# Patient Record
Sex: Female | Born: 1955 | Race: White | Hispanic: No | Marital: Married | Smoking: Former smoker
Health system: Southern US, Community
[De-identification: ages and names within clinical notes are randomized; demographics above are authoritative.]

## PROBLEM LIST (undated history)

## (undated) DIAGNOSIS — I10 Essential (primary) hypertension: Secondary | ICD-10-CM

## (undated) DIAGNOSIS — F419 Anxiety disorder, unspecified: Secondary | ICD-10-CM

## (undated) DIAGNOSIS — E7800 Pure hypercholesterolemia, unspecified: Secondary | ICD-10-CM

## (undated) DIAGNOSIS — K219 Gastro-esophageal reflux disease without esophagitis: Secondary | ICD-10-CM

## (undated) HISTORY — PX: ABDOMINAL HYSTERECTOMY: SHX81

## (undated) HISTORY — PX: ADRENAL GLAND SURGERY: SHX544

## (undated) HISTORY — PX: CATARACT EXTRACTION: SUR2

---

## 2001-08-24 ENCOUNTER — Ambulatory Visit (HOSPITAL_COMMUNITY): Admission: RE | Admit: 2001-08-24 | Discharge: 2001-08-24 | Payer: Self-pay | Admitting: Gastroenterology

## 2001-08-24 ENCOUNTER — Encounter (INDEPENDENT_AMBULATORY_CARE_PROVIDER_SITE_OTHER): Payer: Self-pay | Admitting: *Deleted

## 2001-11-08 ENCOUNTER — Encounter: Payer: Self-pay | Admitting: Emergency Medicine

## 2001-11-08 ENCOUNTER — Ambulatory Visit (HOSPITAL_COMMUNITY): Admission: EM | Admit: 2001-11-08 | Discharge: 2001-11-08 | Payer: Self-pay | Admitting: Emergency Medicine

## 2004-09-12 ENCOUNTER — Encounter: Admission: RE | Admit: 2004-09-12 | Discharge: 2004-09-12 | Payer: Self-pay | Admitting: Chiropractic Medicine

## 2004-09-20 ENCOUNTER — Encounter: Admission: RE | Admit: 2004-09-20 | Discharge: 2004-09-20 | Payer: Self-pay | Admitting: Gastroenterology

## 2004-09-24 ENCOUNTER — Encounter: Admission: RE | Admit: 2004-09-24 | Discharge: 2004-09-24 | Payer: Self-pay | Admitting: Internal Medicine

## 2004-11-26 ENCOUNTER — Inpatient Hospital Stay (HOSPITAL_COMMUNITY): Admission: RE | Admit: 2004-11-26 | Discharge: 2004-11-29 | Payer: Self-pay | Admitting: Urology

## 2004-11-26 ENCOUNTER — Encounter (INDEPENDENT_AMBULATORY_CARE_PROVIDER_SITE_OTHER): Payer: Self-pay | Admitting: *Deleted

## 2005-02-05 ENCOUNTER — Ambulatory Visit (HOSPITAL_COMMUNITY): Admission: RE | Admit: 2005-02-05 | Discharge: 2005-02-05 | Payer: Self-pay | Admitting: Endocrinology

## 2005-08-25 ENCOUNTER — Encounter: Admission: RE | Admit: 2005-08-25 | Discharge: 2005-08-25 | Payer: Self-pay | Admitting: Neurosurgery

## 2005-09-02 ENCOUNTER — Encounter: Admission: RE | Admit: 2005-09-02 | Discharge: 2005-09-02 | Payer: Self-pay | Admitting: Otolaryngology

## 2005-09-19 ENCOUNTER — Ambulatory Visit (HOSPITAL_COMMUNITY): Admission: RE | Admit: 2005-09-19 | Discharge: 2005-09-19 | Payer: Self-pay | Admitting: Internal Medicine

## 2008-07-13 ENCOUNTER — Encounter (INDEPENDENT_AMBULATORY_CARE_PROVIDER_SITE_OTHER): Payer: Self-pay | Admitting: Obstetrics and Gynecology

## 2008-07-13 ENCOUNTER — Ambulatory Visit (HOSPITAL_COMMUNITY): Admission: RE | Admit: 2008-07-13 | Discharge: 2008-07-14 | Payer: Self-pay | Admitting: Obstetrics and Gynecology

## 2010-03-18 ENCOUNTER — Other Ambulatory Visit: Payer: Self-pay | Admitting: Gynecologic Oncology

## 2010-04-11 ENCOUNTER — Other Ambulatory Visit: Payer: Self-pay | Admitting: Obstetrics and Gynecology

## 2010-04-11 ENCOUNTER — Ambulatory Visit: Payer: BC Managed Care – PPO | Attending: Gynecologic Oncology | Admitting: Gynecologic Oncology

## 2010-04-11 DIAGNOSIS — R87612 Low grade squamous intraepithelial lesion on cytologic smear of cervix (LGSIL): Secondary | ICD-10-CM | POA: Insufficient documentation

## 2010-04-11 DIAGNOSIS — E785 Hyperlipidemia, unspecified: Secondary | ICD-10-CM | POA: Insufficient documentation

## 2010-04-11 DIAGNOSIS — I10 Essential (primary) hypertension: Secondary | ICD-10-CM | POA: Insufficient documentation

## 2010-04-11 DIAGNOSIS — F411 Generalized anxiety disorder: Secondary | ICD-10-CM | POA: Insufficient documentation

## 2010-04-11 DIAGNOSIS — Z79899 Other long term (current) drug therapy: Secondary | ICD-10-CM | POA: Insufficient documentation

## 2010-04-15 NOTE — Consult Note (Signed)
NAME:  Tammy Weeks, GOVERNALE NO.:  [id number]  MEDICAL RECORD NO.:  [id number]           PATIENT TYPE:  LOCATION:                                 FACILITY:  PHYSICIAN:  Laurette Schimke, MD          DATE OF BIRTH:  DATE OF CONSULTATION:  04/11/2010 DATE OF DISCHARGE:                                CONSULTATION   REASON FOR VISIT:  Consultation was requested for low-grade squamous intraepithelial lesion Papanicolaou.  HISTORY OF PRESENT ILLNESS:  This is a lovely 55 year old gravida 1, para 0 with a history of abnormal Pap tests for over 12 years.  She reports a history of two prior LEEP procedures.  The degree of dysplasia is unclear.  Because of persistently abnormal Pap tests, she underwent a laparoscopic-assisted vaginal hysterectomy and bilateral salpingo- oophorectomy in July 2010.  Final pathology was consistent with HPV changes.  No high-grade dysplasia was identified.  Her history is notable for a recent Pap that demonstrated ascus negative HPV.  RepeatPap 6 months later was notable for a low-grade squamous intraepithelial lesion.  She denies any vulva pruritus.  She denies any history of vulva intraepithelial neoplasia.  PAST MEDICAL HISTORY:  Hypertension for 8 years, hyperlipidemia for 8 years, longstanding anxiety.  PAST SURGICAL HISTORY:  Cervical LEEPs, laparoscopic-assisted vaginal hysterectomy and bilateral salpingo-oophorectomy in July 20, 2008.  SOCIAL HISTORY:  She reports a history of one pack per day tobacco use for 20 years that stopped 15 years ago.  She is married in a close, warm, loving relationship.  FAMILY HISTORY:  Notable for a brother diagnosed with prostate cancer at the age of 20 and a mother with uterine cancer diagnosed at the age of 68.  MEDICATIONS: 1. Vagifem three times per week. 2. Zoloft 50 mg daily. 3. Diovan 80/12.5 mg daily. 4. Multivitamins. 5. Calcium.  ALLERGIES:  SULFA AND PERCOCET THAT CAUSE HIVES.  REVIEW  OF SYSTEMS:  No vaginal bleeding.  Denies vaginal dryness.  No vulva pruritus or vulva masses.  PHYSICAL EXAMINATION:  GENERAL:  A well-developed pleasant female in no acute distress. VITAL SIGNS:  Weight 176 pounds, height 5 feet 3 inches, blood pressure 120/64, pulse of 76. LYMPH NODE SURVEY:  No inguinal adenopathy. PELVIC EXAMINATION:  Normal external genitalia, Bartholin's, urethra and Skene's.  Colposcopy of the vulva is without any acetowhite changes in the area of the vulva, perineum or perianal areas.  On pelvic examination the vagina is atrophic.  Colposcopy of the vagina was notable for the absence of any acetowhite changes.  There were two areas, unclear if it is just secondary to atrophy or possible residual glandular areas.  These are at 3 o'clock and 9 o'clock approximately 1 cm from the vaginal apex.  Both were biopsied.  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  History of Papanicolaou demonstrating ascus human papillomavirus negative.  I do not have the human papillomavirus DNA typing present and it is unclear whether it is negative human papillomavirus for high-risk oncogenic types or whether it is negative for the nononcogenic and oncogenic types.  I assured Mrs.  Hamblin and her husband that human papillomavirus negativity provides a great deal of comfort that there is little chance of dysplastic process that would progress to dysplasia.  It is quite possible that she is positive for a low-risk human papillomavirus type which would be consistent with her abnormal low-grade Papanicolaou, which does demonstrate a low-risk human papillomavirus infection.  I have requested review of the Papanicolaou test by Dr. Hollice Espy as a second opinion and I will contact the patient with the results of the biopsies and the Papanicolaou reviews.  If no significant dysplasia is identified, then I would recommend at  most annual Papanicolaou test, especially in the face of a negative human papillomavirus.  We will also try to obtain the human papillomavirus DNA testing to confirm which human papillomavirus she is negative for.  They had very many questions and I believe that they were all answered to their satisfaction.     Laurette Schimke, MD     WB/MEDQ  D:  04/11/2010  T:  04/12/2010  Job:  [id number]  cc:   Marcelino Duster L. Vincente Poli, M.D. Fax: [phone]  Telford Nab, R.N. [address] [place], [place] [address]  Electronically Signed by Laurette Schimke MD on 04/15/2010 11:29:32 AM

## 2010-04-21 LAB — CBC
HCT: 31.3 % — ABNORMAL LOW (ref 36.0–46.0)
MCHC: 35.8 g/dL (ref 30.0–36.0)
MCV: 95.8 fL (ref 78.0–100.0)
Platelets: 182 10*3/uL (ref 150–400)
RBC: 3.26 MIL/uL — ABNORMAL LOW (ref 3.87–5.11)

## 2010-04-22 LAB — BASIC METABOLIC PANEL
BUN: 16 mg/dL (ref 6–23)
CO2: 28 mEq/L (ref 19–32)
Calcium: 9.3 mg/dL (ref 8.4–10.5)
Chloride: 102 mEq/L (ref 96–112)
Creatinine, Ser: 0.72 mg/dL (ref 0.4–1.2)
GFR calc Af Amer: >60 mL/min (ref >60)

## 2010-04-22 LAB — CBC
MCHC: 35.3 g/dL (ref 30.0–36.0)
MCV: 93.9 fL (ref 78.0–100.0)
Platelets: 237 10*3/uL (ref 150–400)
RBC: 4.43 MIL/uL (ref 3.87–5.11)

## 2010-05-03 NOTE — Consult Note (Signed)
  NAME:  Tammy Weeks, SHADOWENS NO.:  [id number]  MEDICAL RECORD NO.:  [id number]           PATIENT TYPE:  LOCATION:                                 FACILITY:  PHYSICIAN:  Laurette Schimke, MD          DATE OF BIRTH:  DATE OF CONSULTATION:04/25/2010 DATE OF DISCHARGE:                                CONSULTATION   REASON FOR CONSULTATION:  Regarding review of Pap test.  Outside Pap test was reviewed by Dr. Pecola Leisure on April 23, 2010. This Pap is regarded as demonstrating a low-grade squamous intraepithelial lesion with presence of koilocyte.  Note is made of the fact that vaginal biopsies are both negative and that the HPV DNA test was a high-risk type.  The Pap changes may likely reflect infection of vaginal cells by low-risk HPV DNA, hence the koilocytic changes.  The recommendation would be for just general surveillance in 1 year given the negative HPV DNA.     Laurette Schimke, MD     WB/MEDQ  D:  04/25/2010  T:  04/26/2010  Job:  [id number]  CC Marcelle Overlie, MD  Telford Nab, RN MSN  Electronically Signed by Laurette Schimke MD on 05/03/2010 09:43:27 AM

## 2010-05-24 ENCOUNTER — Other Ambulatory Visit: Payer: Self-pay | Admitting: Dermatology

## 2010-05-28 NOTE — H&P (Signed)
NAME:  Tammy Weeks, Tammy Weeks NO.:  [id number]   MEDICAL RECORD NO.:  [id number]          PATIENT TYPE:  AMB   LOCATION:                                [facility]  [facility]   PHYSICIAN:  Michelle L. Grewal, M.D.DATE OF BIRTH:  December 09, 1955   DATE OF ADMISSION:  07/13/2008  DATE OF DISCHARGE:                              HISTORY & PHYSICAL   HISTORY OF PRESENT ILLNESS:  This patient is a 55 year old gravida 0  postmenopausal presents for LAVH and BSO.  She has a history of cervical  dysplasia.  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  She had a Pap smear in November 2009 that  was normal.  She subsequently had a followup Pap smear in April of this  year that showed low-grade SIL.  She was counseled in regards to the  possibility of recurrent dysplasia.  She does not want to have a  colposcopy.  She wants to have an LAVH and at the same time requests  removal of her ovaries.  She is well aware and has been counseled about  the potential for vaginal dysplasia post hysterectomy and the  ramifications of that.   MEDICATIONS:  Zoloft, Diovan, Lipitor, multivitamin, and calcium.   ALLERGIES:  She is allergic to OXYCODONE and QUINOLONES.   SOCIAL HISTORY:  She is married.  She is not a smoker.  She drinks one  alcoholic drink per day.  She denies any social drug use.  Her primary  care doctor is Dr. Donette Larry.   FAMILY HISTORY:  Significant for osteoporosis, hypertension, endometrial  cancer.   PAST MEDICAL HISTORY:  Significant for cervical dysplasia, high  cholesterol, and hypertension.   REVIEW OF SYSTEMS:  Unremarkable.   PHYSICAL EXAMINATION:  VITAL SIGNS:  Height 5 foot 2 inches, weight 171,  BP 128/78.  GENERAL:  She is alert and oriented, in no apparent distress.  LUNGS:  Clear to auscultation bilaterally.  CARDIAC:  Regular rate and rhythm.  BREASTS:  Soft, nontender, no masses.  PELVIC:  External genitalia within normal  limits.  Vagina appears  normal.  Cervix grossly appears normal.  She has fair descensus.  Cervix  is normal size.  The uterus is normal size.  Adnexa nontender.  No  masses.  EXTREMITIES:  No edema and negative Hoffman.   IMPRESSION:  Persistent cervical dysplasia.   PLAN:  We will proceed with LAVH and BSO.  The risk of the surgery have  been discussed with the patient which include the risk of anesthesia,  risk of injury to internal organs, risk of bleeding and infection, risk  of DVT.  Postop care discussed with the patient.      Michelle L. Vincente Poli, M.D.  Electronically Signed     MLG/MEDQ  D:  07/10/2008  T:  07/11/2008  Job:  [id number]

## 2010-05-28 NOTE — Op Note (Signed)
NAME:  Tammy Weeks, VOHS NO.:  [id number]   MEDICAL RECORD NO.:  [id number]          PATIENT TYPE:  OIB   LOCATION:  9309                          FACILITY:  [facility]   PHYSICIAN:  Michelle L. Grewal, M.D.DATE OF BIRTH:  03-12-1955   DATE OF PROCEDURE:  07/13/2008  DATE OF DISCHARGE:  07/14/2008                               OPERATIVE REPORT   PREOPERATIVE DIAGNOSIS:  Cervical dysplasia.   POSTOPERATIVE DIAGNOSES:  1. Cervical dysplasia and adhesions.  2. Adhesions involving the left ovary, tubes, and colon.  3. Right ovarian cyst.   SURGERIES:  Laparoscopic-assisted vaginal hysterectomy and bilateral  salpingo-oophorectomy and lysis of adhesions.   SURGEON:  Michelle L. Vincente Poli, MD   ASSISTANT:  Dineen Kid. Rana Snare, MD   ESTIMATED BLOOD LOSS:  150 mL.   ANESTHESIA:  General.   PATHOLOGY:  Uterus, cervix, fallopian tubes and ovaries.   DRAINS:  Foley catheter.   PROCEDURE IN DETAIL:  This patient have been formally consented in the  office.  She was taken to the operating room and intubated by  Anesthesia.  She was then prepped and draped in the lithotomy position  and a Foley catheter was inserted and draining clear urine.  A time-out  was then performed.  A uterine manipulator was inserted into the uterus.  The attention was turned to the umbilicus.  A small skin incision was  made.  A Veress needle was inserted.  Pneumoperitoneum was performed.  The Veress needle was removed and the trocar was inserted and the  patient was placed in Trendelenburg position.  The laparoscope was  introduced through the trocar sheath.  The secondary 5-mm trocar was  placed suprapubically under direct visualization.  I then inspected the  pelvis.  The uterus was very small and mobile.  No endometriosis was  noted.  Her right ovary was enlarged and it appeared to be very smooth-  walled cyst approximately 3 cm.  My impression is that possibly could  have been a mucinous cystadenoma  or just a simple paraovarian cyst.  I  then inspected the left ovary.  The left ovary was high in the pelvis  and densely adherent to the colon.  It was slightly enlarged.  We  decided to approach the right ovary and tube first.  I placed an  atraumatic grasper on the ovary, elevated it, identified the ureter  which was well down in the pelvis and well away from the ovary, placed  the EnSeal instrument just below the ovary, cauterized the  infundibulopelvic ligament and carried that down to the round ligament,  that was performed with excellent hemostasis.  The ureter was  peristalsing normally.  We then turned our attention to the left side.  Because I knew this will be difficult dissection because of extensive  adhesions involving the colon, I requested Dr. Rana Snare to come in and he  scrubbed in for this portion and the remainder of the procedure, and  elevated the left ovary as much as I could and then I decided for better  visualization to put in a third port, which I did in  the left lower  quadrant with a 5-mm trocar under direct visualization.  Using  hydrodissection, able to identify a plane between the ovary and the  colon.  Using small snips with the Metzenbaum scissors, I was able to  tiptoe between the colon and the ovary.  Once we were able to mobilize  the colon more medially with excellent hemostasis, then I was able to  visualize the IP ligament.  Again, the ureter was well below where we  were, so I placed an EnSeal instrument across the IP ligament,  cauterized that and carried that down to the round ligament.  Once we  mobilized the colon, we were able to perform this side of the procedure  without much difficulty.  However, the lysis of adhesions took at least  30 minutes to do, because we did them so slowly.  There was no contact  with the bowel serosa with the scissors whatsoever and so my concern for  a bowel injury was very low.  At this point, we then converted  vaginally  by releasing the pneumoperitoneum and then placing a weighted speculum  in the vagina, grasping the cervix with a tenaculum, her cervix by the  way was very short because she had a cone biopsy in 2009.  A  circumferential incision was made around the cervix.  The posterior cul-  de-sac was entered sharply using Mayo scissors.  A weighted speculum was  placed in the cul-de-sac.  We then developed a bladder flap anteriorly  and we entered the anterior cul-de-sac with Metzenbaum scissors.  Using  curved Heaney clamp, the uterosacral cardinal ligaments on either side  were clamped, staying just beside the cervix.  Each pedicle was cut and  suture ligated using 0 Vicryl suture.  Once we have reached our way up  the broad ligament with a series of another set of clamps, each pedicle  was suture ligated using 0 Vicryl suture.  At this point, we were able  to retroflex the uterus.  The remainder of the broad ligament was  clamped on either side.  The specimen was removed and was identified the  cervix, uterus, fallopian tubes and bilateral ovaries.  The remainder of  the pedicle was suture ligated using 0 Vicryl suture.  The posterior  cuff was closed from 3-9 o'clock in a running locked stitch using 0  Vicryl.  The cuff was closed completely anterior to posterior using 0  Vicryl suture.  Hemostasis was excellent.  At this point, I changed my  gloves and went back up to the abdomen where I replaced the  pneumoperitoneum.  I then irrigated the pelvis.  The cul-de-sac and the  vaginal cuff was completely hemostatic.  There was a very large raw area  over on the patient's left side from the lysis of adhesions, which I did  place an Interceed.  There was 1 small bleeder on the peritoneum, which  I buzzed with a Kleppinger.  Otherwise, everything was hemostatic.  I  placed Interceed between the sidewall and the colon, so hopefully this  will minimize adhesion formation in the future.  The  pneumoperitoneum  was released.  The trocars were removed.  Local was infiltrated at each  site, each skin site was closed with interrupted using 3-0 Vicryl.  All  sponge, lap and instrument counts were correct x2.  The patient was  extubated.  She went to recovery room in stable condition.      Michelle L. Vincente Poli, M.D.  Electronically Signed     MLG/MEDQ  D:  07/14/2008  T:  07/14/2008  Job:  [id number]

## 2010-05-29 ENCOUNTER — Other Ambulatory Visit: Payer: Self-pay | Admitting: Otolaryngology

## 2010-05-29 DIAGNOSIS — R220 Localized swelling, mass and lump, head: Secondary | ICD-10-CM

## 2010-05-31 ENCOUNTER — Ambulatory Visit
Admission: RE | Admit: 2010-05-31 | Discharge: 2010-05-31 | Disposition: A | Discharge status: Home / Self Care | Payer: BC Managed Care – PPO | Source: Ambulatory Visit | Attending: Otolaryngology | Admitting: Otolaryngology

## 2010-05-31 DIAGNOSIS — R220 Localized swelling, mass and lump, head: Secondary | ICD-10-CM

## 2010-05-31 NOTE — Discharge Summary (Signed)
NAME:  Tammy Weeks, Tammy Weeks NO.:  [id number]   MEDICAL RECORD NO.:  [id number]          PATIENT TYPE:  INP   LOCATION:  1611                         FACILITY:  [facility]   PHYSICIAN:  Heloise Purpura, MD      DATE OF BIRTH:  [birth year]   DATE OF ADMISSION:  11/26/2004  DATE OF DISCHARGE:  11/29/2004                                 DISCHARGE SUMMARY   ADMISSION DIAGNOSIS:  Right adrenal mass.   DISCHARGE DIAGNOSIS:  Right adrenocortical adenoma.   HISTORY AND PHYSICAL:  For full details, please see admission history and  physical.  Briefly, [name] is a 55 year old female who was found to have an  incidentally detected 4.8 cm right adrenal mass on a CT scan.  This was  further evaluated with an MRI and did appear to have imaging characteristics  consistent with an adrenal adenoma.  After a biochemical evaluation, this  mass did appear to be nonfunctional; however, due to the size of the mass,  it was decided to proceed with an adrenalectomy.   HOSPITAL COURSE:  The patient was admitted to the hospital on November 26, 2004 and underwent a right laparoscopic adrenalectomy.  The patient  tolerated this procedure well without complications.  Postoperatively, she  was able to be transferred to a regular hospital room following recovery  from anesthesia.  Over the course of the next couple of days, she was able  to resume ambulation.  Her diet was gradually advanced, as bowel function  returned.  By postoperative day #3, the patient wass tolerating a clear-  liquid diet and having bowel movements.  She was ambulating without  difficulty, and her pain was controlled with oral pain medication.  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  This mass measured 5.5 cm.  The pathology report  was discussed with the patient prior to her discharge.  The patient's blood  pressure remained stable throughout her hospital course;  however, she was  not maintained on her Diovan throughout her hospitalization.   DISPOSITION:  Home.   DISCHARGE INSTRUCTIONS:  The patient was instructed to gradually advance her  diet at home.  She was instructed to be ambulatory but to specifically  refrain from any heavy lifting or strenuous activity.  She was told that she  may resume all of her regular home medications except for Diovan.  She will  monitor her blood pressure at  home and will notify Dr. Donette Larry over the course of the next couple of weeks  if her blood pressure remains low, to re-evaluate whether she should resume  this medication.   FOLLOW UP:  Patient will plan to follow up in approximately 10 days for  postoperative check.           ______________________________  Heloise Purpura, MD  Electronically Signed     LB/MEDQ  D:  11/29/2004  T:  11/29/2004  Job:  [id number]   cc:   Jeannett Senior A. Evlyn Kanner, M.D.  Fax: [phone]   Georgann Housekeeper, MD  Fax: [phone]

## 2010-05-31 NOTE — Op Note (Signed)
NAME:  Tammy Weeks, Tammy Weeks NO.:  [id number]   MEDICAL RECORD NO.:  [id number]                   PATIENT TYPE:  AMB   LOCATION:  ENDO                                 FACILITY:  [facility]   PHYSICIAN:  Charolett Bumpers, M.D.             DATE OF BIRTH:  1955/04/25   DATE OF PROCEDURE:  08/24/2001  DATE OF DISCHARGE:  08/24/2001                                 OPERATIVE REPORT   PROCEDURE:  Colonoscopy and polypectomy.   REFERRED BY:  Zola Button T. Lazarus Salines, M.D., and Georgann Housekeeper, M.D.   INDICATIONS:  The patient is a 55 year old female born Jan 16, 2055.  The patient  has intermittent painless hematochezia.  Approximately three years ago she  underwent a diagnostic colonoscopy with polypectomy in [place], [place].  I do not have a polyp pathology report.   The patient is undergoing a follow-up diagnostic colonoscopy to evaluate the  intermittent painless hematochezia.   ENDOSCOPIST:  Charolett Bumpers, M.D.   PREMEDICATION:  Versed 10 mg, fentanyl 100 mg.   ENDOSCOPE:  Olympus pediatric colonoscope.   DESCRIPTION OF PROCEDURE:  After obtaining informed consent, the patient was  placed in the left lateral decubitus position.  I administered intravenous  Versed and intravenous fentanyl to achieve conscious sedation for the  procedure.  The patient's blood pressure, oxygen saturation, and cardiac  rhythm were monitored during the procedure and documented in the medical  record.   Anal inspection was normal.  Digital rectal exam was normal.  The Olympus  pediatric video colonoscope was introduced into the rectum and easily  advanced into the cecum.  A normal-appearing ileocecal valve was intubated  and the distal ileum inspected.  Colonic preparation for the exam today was  excellent.   Rectum normal.   Sigmoid colon and descending colon:  From the distal sigmoid colon at  approximately 30 cm from the anal verge, two 1 mm sessile polyps were  removed  with the hot biopsy forceps and submitted for pathologic  interpretation.   Splenic flexure normal.   Transverse colon normal.   Hepatic flexure normal.   Ascending colon normal.   Cecum and ileocecal valve.   Distal ileum normal.   ASSESSMENT:  From the distal sigmoid colon, two 1 mm sessile polyps were  removed with hot biopsy forceps; otherwise normal proctocolonoscopy to the  cecum.   RECOMMENDATIONS:  If the polyps removed today or the polyps removed during  her colonoscopy in [place] three years ago are neoplastic (adenomatous), the  patient should undergo a repeat colonoscopy in five years.                                                    Charolett Bumpers, M.D.    MKJ/MEDQ  D:  08/24/2001  T:  08/26/2001  Job:  [id number]

## 2010-05-31 NOTE — H&P (Signed)
NAME:  Tammy Weeks, Tammy Weeks NO.:  [id number]   MEDICAL RECORD NO.:  [id number]          PATIENT TYPE:  INP   LOCATION:  0007                         [facility]ACILITY:  [facility]   PHYSICIAN:  Heloise Purpura, MD      DATE OF BIRTH:  1955/11/02   DATE OF ADMISSION:  11/26/2004  DATE OF DISCHARGE:                                HISTORY & PHYSICAL   CHIEF COMPLAINT:  Right adrenal mass.   HISTORY:  Tammy Weeks is a 55 year old female who recently underwent a CT scan  for gastrointestinal complaints. Incidentally, she was noted to have a 4.8  cm right adrenal mass on her CT scan. This was further characterized with an  MRI and imaging characteristics were consistent with an adrenal adenoma.  Clinically, although she has had some hypertension and what she describes as  panic attacks, she did not have clinical symptoms consistent with a  functional adrenal mass. Furthermore, she underwent a full biochemical  evaluation which demonstrated normal urine aldosterone levels, normal  potassium, normal 24-hour urine cortisol, and 24-hour urine catecholamines.  After a discussion regarding the size of her adrenal mass, the patient did  elect to proceed with a laparoscopic adrenalectomy. She was therefore  admitted to the hospital on November 26, 2004, for this procedure.   PAST MEDICAL HISTORY:  Hypertension.   PAST SURGICAL HISTORY:  1.  Removal of a breast lesion that was benign in 1995.  2.  Colonoscopy.   CURRENT MEDICATIONS:  1.  Diovan.  2.  Crestor.  3.  Zoloft.   ALLERGIES:  PERCOCET which causes a rash.   FAMILY HISTORY:  The patient's father died at age 54 of a heart attack. The  patient's mother died at age 39 of a malignancy. The patient has two  siblings who are living and are healthy. She denies a history of adrenal  problems in the family or GU malignancy in the family. There is a history of  heart disease and high blood pressure in the family.   SOCIAL HISTORY:  The  patient did smoke a half a pack of cigarettes for 20  years but quit 8 years ago. She drinks alcohol occasionally.   REVIEW OF SYSTEMS:  A complete review of systems was performed. Pertinent  positives and negatives are included in the history of present illness. In  addition, the patient denies specifically any weight loss, night sweats, or  fever. All other systems are reviewed and otherwise negative.   PHYSICAL EXAMINATION:  HEENT:  Normocephalic, atraumatic.  NECK:  No thyromegaly.  LYMPH NODE SURVEY:  There was no cervical, supraclavicular, or inguinal  lymphadenopathy.  HEART:  Regular rate and rhythm without obvious  murmurs.  LUNGS:  Clear bilaterally.  ABDOMEN:  Soft, nontender, nondistended, without abdominal masses.  BACK:  No CVA tenderness.  GENITOURINARY:  Deferred.  NEUROLOGIC:  Grossly intact.  EXTREMITIES:  No edema.   IMPRESSION:  A 4.8 cm right adrenal mass.   PLAN:  The patient will undergo a right laparoscopic adrenalectomy and then  be admitted to the hospital for routine postoperative care.  ______________________________  Heloise Purpura, MD  Electronically Signed     LB/MEDQ  D:  11/26/2004  T:  11/26/2004  Job:  [id number]

## 2010-05-31 NOTE — Cardiovascular Report (Signed)
NAME:  Tammy Weeks, Tammy Weeks NO.:  [id number]   MEDICAL RECORD NO.:  [id number]                   PATIENT TYPE:  INP   LOCATION:  1827                                 FACILITY:  [facility]   PHYSICIAN:  Meade Maw, M.D.                 DATE OF BIRTH:  05-Jun-1955   DATE OF PROCEDURE:  DATE OF DISCHARGE:                              CARDIAC CATHETERIZATION   INDICATIONS FOR PROCEDURE:  Recurrent chest pain with nonspecific ST  changes.   DESCRIPTION OF PROCEDURE:  After obtaining written informed consent, the  patient was brought to the cardiac catheterization lab in the postabsorptive  state.  Preoperative sedation was achieved using Versed 3 mg and Fentanyl 50  mcg.  The right groin was prepped and draped in the usual sterile fashion.  Local anesthesia was achieved using 1% Xylocaine.  A #6 French hemostasis  sheath was placed into the right femoral artery using the modified Seldinger  technique.  Selective coronary angiography was performed using a JL4/JR4  Judkins catheter.  Single plane ventriculogram was performed in the RAO  position using a #6 French pigtail curved catheter.  All catheter exchanges  were made over a guidewire.  The hemostasis sheath was flushed following  each engagement.  Following the procedure, there was no identifiable  disease.  The patient was transferred to the holding area.  The hemostasis  sheath was removed.  Hemostasis was achieved using digital pressures.   FINDINGS:  1. Fluoroscopy revealed no calcification of the coronaries.  2. The left ventricular pressure was 133 with an EDP of 18.  Aortic pressure     was 151 with an aortic diastolic pressure of 92.  There was no gradient     noted on pullback.  3. Single plane ventriculogram revealed normal wall motion.  Ejection     fraction was 70%.  4. Coronary angiography     A. The left main coronary artery bifurcates into the left anterior        descending and circumflex  vessel.  There is no disease in the left        main coronary artery.     B. Left anterior descending:  The left anterior descending gives rise to        a moderate to large D-1, moderate D-2, goes on to end as an apical        branch.  There is no disease in the left anterior descending or its        branches.  The distal left anterior descending is rather small in        caliber.     C. Circumflex vessel:  The circumflex vessel is a moderate to large        vessel.  It gives rise to a large trifurcating OM-1, then goes on to        end as a small AV  groove vessel.  There is no disease in the        circumflex or its branches.     D. Right coronary artery:  The right coronary artery is a large dominant        artery.  It gives rise to an early RV marginal, a small RV marginal        #2, a PDA, and a large PL branch.  There is no disease in the right        coronary artery or its branches.    FINAL IMPRESSION:  1. Normal coronary angiography.  2. Normal single plane ventriculogram.   RECOMMENDATIONS:  Other etiologies for chest pain should be considered.                                                Meade Maw, M.D.    HP/MEDQ  D:  11/08/2001  T:  11/08/2001  Job:  [id number]   cc:   Georgann Housekeeper, M.D.  [address]  [place]  [place] [address]  Fax: [phone]

## 2010-05-31 NOTE — Op Note (Signed)
NAME:  Tammy Weeks, Tammy Weeks NO.:  [id number]   MEDICAL RECORD NO.:  [id number]          PATIENT TYPE:  INP   LOCATION:  0007                         [facility]AC[facility]  [facility]   PHYSICIAN:  Heloise Purpura, MD      DATE OF BIRTH:  1955/10/17   DATE OF PROCEDURE:  11/26/2004  DATE OF DISCHARGE:                                 OPERATIVE REPORT   PREOPERATIVE DIAGNOSIS:  Right adrenal mass.   POSTOPERATIVE DIAGNOSIS:  Right adrenal mass.   PROCEDURE:  Right laparoscopic adrenalectomy.   SURGEON:  Dr. Gaynelle Arabian.   ASSISTANT:  Dr. Heloise Purpura.   ANESTHESIA:  General.   COMPLICATIONS:  None.   ESTIMATED BLOOD LOSS:  100 cc.   INTRAVENOUS FLUIDS:  Lactated Ringer's 5500 cc.   SPECIMENS:  Right adrenal gland.   INDICATIONS:  Tammy Weeks is a 55 year old female who recently underwent a CT  scan for gastrointestinal complaints.  Incidentally, she was noted to have a  4.8 cm right adrenal mass, consistent with an adrenal adenoma by imaging  criteria.  The patient underwent a biochemical evaluation, and her adrenal  mass did appear to be a nonfunctional mass; however, due to the size of this  mass and after further discussion, the patient did elect to proceed with a  laparoscopic adrenalectomy.  Potential risks and benefits of this procedure  were discussed with the patient, and she consented.   DESCRIPTION OF PROCEDURE:  The patient was taken to the operating room, and  a general anesthetic was administered.  The patient was given preoperative  antibiotics and placed in the right modified flank position with care to pad  any potential pressure points.  The patient was then prepped and draped in  the usual sterile fashion, and a preoperative time out was performed.  Next,  a 1 cm vertical incision was made in the midline just superior to the  umbilicus and carried down through the subcutaneous tissue with  electrocautery.  The rectus fascia was then carefully opened in the  midline,  allowing entry into the peritoneal cavity under direct vision.  Two 0 Vicryl  stitches were then placed in the rectus fascia, and a 10 mm Hasson port was  placed into the peritoneal cavity and secured with the fascial stitches.  A  30 degree lens was used to inspect the abdomen.  There was no evidence of  any intra-abdominal injury or other abnormalities.  Attention was then  turned to placement of the remaining ports.  A 10 mm port was placed midway  between the umbilicus and xiphoid process just to the right of the falciform  ligament.  An additional 10 mm port was placed in the right lower quadrant  just lateral to the rectus muscle.  Two additional 5 mm ports were placed in  the right lateral abdominal wall for suction/irrigation, and liver  retraction.  All ports were placed under direct vision and without  difficulty.  The abdomen was insufflated up to 15 mmHg pressure.  The self-  retaining liver retractor was then placed to retract the liver cephalad,  thereby exposing the  adrenal gland and kidney just below the parietal  peritoneum.  The peritoneum was then incised with the harmonic scalpel,  allowing entry into the retroperitoneal space and identification of the  adrenal mass.  Similarly, the coronary ligament was divided along the length  of the liver along with a portion of the triangular ligament, allowing the  liver to be retracted more cephalad and improving exposure to the adrenal  gland.  With the aid of the harmonic scalpel, the tissue between the  superior pole of the kidney and the adrenal gland was carefully ligated with  ultrasonic energy.  This allowed the kidney to be retracted inferiorly, and  the remaining inferior attachments of the adrenal gland to be divided.  Similarly, many of the superior attachments to the adrenal gland were also  divided with the harmonic scalpel.  Any small arterial vessels were clipped.  The duodenum was already immobilized  medially, and the inferior vena cava  was readily identifiable.  Just medial to the adrenal gland, the adrenal  vein was seen draining into the inferior vena cava.  It was carefully  isolated with right angle dissection and then divided with multiple Hem-o-  Lok clips.  The remaining medial attachments to the adrenal gland were  ligated with either the harmonic scalpel or Hem-o-Lok clips.  The remaining  superior and lateral attachments to the adrenal gland were similarly divided  with the harmonic scalpel.  The adrenal gland was then removed and placed up  into the right upper quadrant just above the liver.  The adrenal fossa was  then carefully examined.  Interestingly, despite her negative preoperative  biochemical evaluation, there was noted to be some hypertension noted with  manipulation of the adrenal gland prior to division of the renal vein.  This  was easily managed; however, with nitroprusside and completely normalized  after the adrenal gland was disarticulated.  The adrenal fossa was then  copiously irrigated and examined for hemostasis.  Hemostasis appeared  excellent.  A half piece of Surgicel was placed in the raw surface of the  adrenal fossa, and the kidney and liver were allowed to fall back into their  normal anatomic positions.  The two 12 mm port sites were then closed with a  0 Vicryl stitch through the abdominal wall fascia with the aid of the suture-  passer device.  All other remaining ports were removed under direct vision.  The Endopouch retrieval bag was used to retrieve the adrenal specimen, and  it was then brought out through the camera port incision in the midline.  This incision was slightly extended superiorly, allowing the specimen to be  removed intact within the retrieval bag.  This fascial opening was then  closed with interrupted 0 Vicryl sutures.  Sensorcaine 0.5% was then  injected into all incision sites, which were then reapproximated at the  skin with 4-0 Vicryl subcuticular closures.  Steri-Strips and sterile dressings  were applied.  The patient appeared to tolerate the procedure well and  without complications.  Of note, all needle and sponge counts were correct  x2 at the end of the procedure.  The patient was able to be extubated and  transferred to the recovery unit in satisfactory condition.           ______________________________  Heloise Purpura, MD  Electronically Signed     LB/MEDQ  D:  11/26/2004  T:  11/26/2004  Job:  [id number]

## 2010-05-31 NOTE — H&P (Signed)
NAME:  Tammy Weeks, Tammy Weeks NO.:  [id number]   MEDICAL RECORD NO.:  [id number]                   PATIENT TYPE:  EMS   LOCATION:  MAJO                                 FACILITY:  [facility]   PHYSICIAN:  Meade Maw, M.D.                 DATE OF BIRTH:  11/16/1955   DATE OF ADMISSION:  11/08/2001  DATE OF DISCHARGE:                                HISTORY & PHYSICAL   PRIMARY CARE PHYSICIAN:  Dr. __________   IMPRESSION:  (As dictated by Dr. Meade Maw)  1. Atypical chest pain in this 55 year old female with a positive family     history for coronary artery disease, a history of dyslipidemia, with a     Cardiolite earlier this summer which was negative for evidence of     ischemia.  Electrocardiogram today revealed lateral precordial changes,     consistent with ischemia which is changed from an electrocardiogram in     June 2003.  The patient had prior electrocardiographic changes of ST     downsloping with T-wave abnormality in lead III, which have improved     today.  2. History of panic attacks, but under good control on Zoloft.   PLAN:  (As dictated by Dr. Fraser Din)  The patient is counseled to undergo and expected plans for a coronary  angiography and possible percutaneous intervention if indicated and able.  The risks, potential complications, benefits, and alternatives to the  procedure were discussed in detail.  Dr. and Mrs. Gloris Manchester. Ranes indicate  that their questions and concerns have been addressed and agreeable to  proceed.  Will supplement serum potassium with 40 mEq of K-Dur (for a  potassium of 3.5).   HISTORY OF PRESENT ILLNESS:  The patient is a very pleasant 55 year old  female who was driving her car today, and then developed lower substernal  chest pressure which was constant.  She had no associated shortness of  breath, nausea, or diaphoresis.  She did have sweaty palms.  This was  unusual, as she experienced a panic attack.  She  denied any radiation of  discomfort, though did notice mid-back pain discomfort.  She reported to the  [facility] where normocephalic,  atraumatic electrocardiogram revealed slight ST depression in the lateral  and precordial leads.  She is currently discomfort-free.   PAST MEDICAL HISTORY:  1. Hematochezia with subsequent colonoscopy by Dr. Danise Edge earlier     this year which was normal.  Two polyps removed, which were benign.  2. Panic/anxiety disorder, well controlled with Zoloft.  3. History of elevated cholesterol, on Zocor.  Denies a history of     hypertension, asthma, diabetes mellitus, or cancer.   PAST SURGICAL HISTORY:  None.   SOCIAL HISTORY:  She has been married for 1-1/2 years.  No children.  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  Tobacco:  Quit five years  earlier.  Tried it off and on as much as one pack per day for 20 years.  ETOH:  Occasional glass of wine a couple of times a week.   ALLERGIES:  PERCOCET OR PERCODAN causing a rash.  She is okay with seafood,  shell fish, and iodinated products.    MEDICATIONS:  1. Zoloft 50 mg q.d.  2. Zocor, uncertain dose, once q.d.  3. Xanax 0.5 mg p.r.n.  Took a dose this morning, but other than this,     rarely taken.  4. Vitamins and calcium.   FAMILY HISTORY:  Mother died at age 73, of throat cancer.  No history of  coronary artery disease.  Father died at age 27 of a myocardial infarction.  She has two older brothers without coronary artery disease.   REVIEW OF SYSTEMS:  As in the HPI and the past medical history.  Otherwise  denies problems with lightheadedness, dizziness, syncope, or near syncopal  episodes.  She denies dysphagia to food or fluids.  Negative constipation,  diarrhea, gastroesophageal reflux disease, or melena.  Denies dysuria or  hematuria.  No arthritic-type complaints.  Denies DOE, orthopnea, or PND.  Has fairly good exercise regimen.   Occasional bike riding and walking,  without problems.   PHYSICAL EXAMINATION:  (As performed by Dr. Fraser Din)  VITAL SIGNS:  Blood pressure 117/89, heart rate 68 and regular, respirations  18.  O2 saturation 98%.  GENERAL:  She is a well-nourished, pleasant middle-aged female, in no acute  distress.  HEENT/NECK:  Brisk bilateral upstroke without bruits.  No significant  jugular venous distention or thyromegaly.  CHEST:  Lung sounds clear with equal bilateral excursion.  Negative CPA  tenderness.  HEART:  A regular rate and rhythm without murmur, rub, or gallop.  Normal  S1, S2.  ABDOMEN:  Soft, nondistended.  Normoactive bowel sounds.  Negative abdominal  aortic, renal, or femoral bruits.  Nontender to applied pressure.  No  masses, no organomegaly appreciated.  EXTREMITIES:  Distal pulses intact.  Negative pedal edema.  NEUROLOGIC:  Grossly nonfocal, alert, and oriented x3.  GENITOURINARY:  Deferred.  RECTAL:  Deferred.   LABORATORY DATA:  Chloride 105, CO2 of 28, BUN 12, creatinine 0.7, glucose  99.  Liver function tests within normal range.  Hemoglobin 13.5, hematocrit  40, WBC 5.8, platelets 250.  CK 65, MB pending, troponin I pending.  ELECTROCARDIOGRAM:  Revealed NSR, a complete right bundle branch block,  slight ST depression/flattening in V4 through V6.  Chest x-ray:  Pending.      Salomon Fick, N.P.                       Meade Maw, M.D.    MES/MEDQ  D:  11/08/2001  T:  11/08/2001  Job:  [id number]

## 2011-03-19 ENCOUNTER — Other Ambulatory Visit: Payer: Self-pay | Admitting: Obstetrics and Gynecology

## 2011-03-19 DIAGNOSIS — R87612 Low grade squamous intraepithelial lesion on cytologic smear of cervix (LGSIL): Secondary | ICD-10-CM | POA: Insufficient documentation

## 2011-07-22 ENCOUNTER — Other Ambulatory Visit: Payer: Self-pay | Admitting: Obstetrics and Gynecology

## 2012-02-18 ENCOUNTER — Other Ambulatory Visit: Payer: Self-pay | Admitting: Obstetrics and Gynecology

## 2012-04-12 ENCOUNTER — Other Ambulatory Visit: Payer: Self-pay | Admitting: Dermatology

## 2012-04-14 ENCOUNTER — Other Ambulatory Visit: Payer: Self-pay | Admitting: Internal Medicine

## 2012-04-14 DIAGNOSIS — R1032 Left lower quadrant pain: Secondary | ICD-10-CM

## 2012-04-16 ENCOUNTER — Ambulatory Visit
Admission: RE | Admit: 2012-04-16 | Discharge: 2012-04-16 | Disposition: A | Discharge status: Home / Self Care | Payer: Commercial Managed Care - PPO | Source: Ambulatory Visit | Attending: Internal Medicine | Admitting: Internal Medicine

## 2012-04-16 DIAGNOSIS — R1032 Left lower quadrant pain: Secondary | ICD-10-CM

## 2012-04-16 MED ORDER — IOHEXOL 300 MG/ML  SOLN
100.0000 mL | Freq: Once | INTRAMUSCULAR | Status: AC | PRN
  Administered 2012-04-16: 100 mL via INTRAVENOUS

## 2012-07-08 ENCOUNTER — Other Ambulatory Visit: Payer: Self-pay | Admitting: Gastroenterology

## 2012-07-08 DIAGNOSIS — Z1211 Encounter for screening for malignant neoplasm of colon: Secondary | ICD-10-CM

## 2012-07-15 ENCOUNTER — Ambulatory Visit
Admission: RE | Admit: 2012-07-15 | Discharge: 2012-07-15 | Disposition: A | Discharge status: Home / Self Care | Payer: Commercial Managed Care - PPO | Source: Ambulatory Visit | Attending: Gastroenterology | Admitting: Gastroenterology

## 2012-07-15 DIAGNOSIS — Z1211 Encounter for screening for malignant neoplasm of colon: Secondary | ICD-10-CM

## 2012-08-17 ENCOUNTER — Other Ambulatory Visit: Payer: Self-pay | Admitting: Obstetrics and Gynecology

## 2013-01-07 ENCOUNTER — Ambulatory Visit
Admission: RE | Admit: 2013-01-07 | Discharge: 2013-01-07 | Disposition: A | Discharge status: Home / Self Care | Payer: Commercial Managed Care - PPO | Source: Ambulatory Visit | Attending: Internal Medicine | Admitting: Internal Medicine

## 2013-01-07 ENCOUNTER — Other Ambulatory Visit: Payer: Self-pay | Admitting: Internal Medicine

## 2013-01-07 DIAGNOSIS — R059 Cough, unspecified: Secondary | ICD-10-CM

## 2013-01-07 DIAGNOSIS — R05 Cough: Secondary | ICD-10-CM

## 2013-03-21 ENCOUNTER — Other Ambulatory Visit: Payer: Self-pay | Admitting: Obstetrics and Gynecology

## 2014-01-02 ENCOUNTER — Other Ambulatory Visit: Payer: Self-pay | Admitting: Otolaryngology

## 2014-03-23 ENCOUNTER — Other Ambulatory Visit: Payer: Self-pay | Admitting: Obstetrics and Gynecology

## 2014-03-24 LAB — CYTOLOGY - PAP

## 2014-07-26 ENCOUNTER — Other Ambulatory Visit: Payer: Self-pay | Admitting: Internal Medicine

## 2014-07-26 DIAGNOSIS — R1032 Left lower quadrant pain: Secondary | ICD-10-CM

## 2014-12-01 ENCOUNTER — Ambulatory Visit (INDEPENDENT_AMBULATORY_CARE_PROVIDER_SITE_OTHER): Payer: PRIVATE HEALTH INSURANCE

## 2014-12-01 ENCOUNTER — Ambulatory Visit (INDEPENDENT_AMBULATORY_CARE_PROVIDER_SITE_OTHER): Payer: PRIVATE HEALTH INSURANCE | Admitting: Podiatry

## 2014-12-01 VITALS — BP 114/80 | HR 63 | Resp 16 | Ht 63.0 in | Wt 160.0 lb

## 2014-12-01 DIAGNOSIS — M779 Enthesopathy, unspecified: Secondary | ICD-10-CM

## 2014-12-01 DIAGNOSIS — M722 Plantar fascial fibromatosis: Secondary | ICD-10-CM

## 2014-12-01 MED ORDER — TRIAMCINOLONE ACETONIDE 10 MG/ML IJ SUSP
10.0000 mg | Freq: Once | INTRAMUSCULAR | Status: AC
  Administered 2014-12-01: 10 mg

## 2014-12-01 NOTE — Progress Notes (Signed)
   Subjective:    Patient ID: Tammy Weeks, female    DOB: 1955-05-27, 59 y.o.   MRN: [id number]  HPI Patient presents with foot pain in their left foot, heel. Pt stated, "Ran a 5K race in October 2016 and felt bottom of foot was stretching".   Review of Systems  Musculoskeletal: Positive for myalgias.  All other systems reviewed and are negative.      Objective:   Physical Exam        Assessment & Plan:

## 2014-12-01 NOTE — Patient Instructions (Signed)

## 2014-12-02 NOTE — Progress Notes (Signed)
Subjective:     Patient ID: Tammy Weeks, female   DOB: January 13, 1956, 59 y.o.   MRN: [id number]  HPI patient states that she's had trouble with her right heel over a period of time and ran a 5K in October and the symptoms got worse   Review of Systems  All other systems reviewed and are negative.      Objective:   Physical Exam  Constitutional: She is oriented to person, place, and time.  Cardiovascular: Intact distal pulses.   Musculoskeletal: Normal range of motion.  Neurological: She is oriented to person, place, and time.  Skin: Skin is warm.  Nursing note and vitals reviewed.  neurovascular status intact muscle strength is found to be adequate with range of motion subtalar midtarsal joint adequate. Patient has moderate depression of the arch and is found to have inflammation and fluid at the medial fascial band at the insertion of the tendon into the calcaneus. 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 She has been utilizing a good foot orthotic which has not been effective for her and does have acute symptoms and today I did go ahead and injected the plantar fascia 3 mg Kenalog 5 mg Xylocaine and applied fascial brace. Instructed on physical therapy and discussed long-term orthotic therapy for this patient and reappoint 2 weeks

## 2014-12-15 ENCOUNTER — Encounter: Payer: Self-pay | Admitting: Podiatry

## 2014-12-15 ENCOUNTER — Ambulatory Visit (INDEPENDENT_AMBULATORY_CARE_PROVIDER_SITE_OTHER): Payer: PRIVATE HEALTH INSURANCE | Admitting: Podiatry

## 2014-12-15 VITALS — BP 133/86 | HR 60 | Resp 16

## 2014-12-15 DIAGNOSIS — M722 Plantar fascial fibromatosis: Secondary | ICD-10-CM | POA: Diagnosis not present

## 2014-12-17 NOTE — Progress Notes (Signed)
Subjective:     Patient ID: Tammy Weeks, female   DOB: 02-02-1955, 59 y.o.   MRN: [id number]  HPI patient states it's feeling quite a bit better but I do get some pain on top of my foot and the heel still hurts if I try to be really active   Review of Systems     Objective:   Physical Exam Neurovascular status intact muscle strength adequate with discomfort in the plantar heel left that is still present but quite a bit improved with mild discomfort on the dorsal lateral aspect of the left foot upon deep palpation    Assessment:     Plantar fasciitis present but improving with dorsal tendinitis which is probably related to gait change    Plan:     H&P condition reviewed and discussed long-term orthotics to reduce stress on the heel. Scanned for custom orthotics and also reviewed physical therapy and shoe gear modifications and we will watch the dorsal foot and decide if anything will be necessary

## 2015-01-09 ENCOUNTER — Encounter: Payer: PRIVATE HEALTH INSURANCE | Admitting: Podiatry

## 2015-01-09 NOTE — Progress Notes (Signed)
   Subjective:    Patient ID: Tammy Weeks, female    DOB: Feb 01, 1955, 59 y.o.   MRN: [id number]  HPI  Dispensed Orthotics 01/09/15  Review of Systems  All other systems reviewed and are negative.      Objective:   Physical Exam        Assessment & Plan:

## 2015-03-25 ENCOUNTER — Emergency Department (HOSPITAL_COMMUNITY)
Admission: EM | Admit: 2015-03-25 | Discharge: 2015-03-26 | Disposition: A | Discharge status: Home / Self Care | Payer: Managed Care, Other (non HMO) | Attending: Emergency Medicine | Admitting: Emergency Medicine

## 2015-03-25 ENCOUNTER — Encounter (HOSPITAL_COMMUNITY): Payer: Self-pay | Admitting: Emergency Medicine

## 2015-03-25 ENCOUNTER — Emergency Department (HOSPITAL_COMMUNITY): Payer: Managed Care, Other (non HMO)

## 2015-03-25 DIAGNOSIS — R079 Chest pain, unspecified: Secondary | ICD-10-CM

## 2015-03-25 DIAGNOSIS — Z79899 Other long term (current) drug therapy: Secondary | ICD-10-CM | POA: Diagnosis not present

## 2015-03-25 DIAGNOSIS — Z7982 Long term (current) use of aspirin: Secondary | ICD-10-CM | POA: Insufficient documentation

## 2015-03-25 DIAGNOSIS — I10 Essential (primary) hypertension: Secondary | ICD-10-CM | POA: Insufficient documentation

## 2015-03-25 DIAGNOSIS — Z87891 Personal history of nicotine dependence: Secondary | ICD-10-CM | POA: Diagnosis not present

## 2015-03-25 DIAGNOSIS — F419 Anxiety disorder, unspecified: Secondary | ICD-10-CM | POA: Insufficient documentation

## 2015-03-25 DIAGNOSIS — M549 Dorsalgia, unspecified: Secondary | ICD-10-CM | POA: Insufficient documentation

## 2015-03-25 DIAGNOSIS — E7800 Pure hypercholesterolemia, unspecified: Secondary | ICD-10-CM | POA: Insufficient documentation

## 2015-03-25 DIAGNOSIS — R61 Generalized hyperhidrosis: Secondary | ICD-10-CM | POA: Diagnosis not present

## 2015-03-25 HISTORY — DX: Essential (primary) hypertension: I10

## 2015-03-25 HISTORY — DX: Pure hypercholesterolemia, unspecified: E78.00

## 2015-03-25 HISTORY — DX: Anxiety disorder, unspecified: F41.9

## 2015-03-25 NOTE — ED Provider Notes (Signed)
CSN: [id number]     Arrival date & time 03/25/15  2255 History  By signing my name below, I, Altamease Oiler, attest that this documentation has been prepared under the direction and in the presence of Neasia Fleeman, MD. Electronically Signed: Altamease Oiler, ED Scribe. 03/26/2015. 2:21 AM   Chief Complaint  Patient presents with  . Chest Pain    Patient is a 60 y.o. female presenting with chest pain. The history is provided by the patient. No language interpreter was used.  Chest Pain Pain location:  Substernal area Pain quality: dull   Pain radiates to:  Does not radiate Pain radiates to the back: no   Pain severity:  Mild Onset quality:  Gradual Timing:  Intermittent Progression:  Unchanged Chronicity:  New Context: stress   Context: no trauma   Relieved by:  Antacids Worsened by:  Nothing tried Ineffective treatments:  Antacids Associated symptoms: back pain and diaphoresis   Associated symptoms: no abdominal pain, no cough, no dizziness, no fever, no lower extremity edema, no nausea, no numbness, no orthopnea, no shortness of breath, no syncope, not vomiting and no weakness   Risk factors: hypertension   Risk factors: no coronary artery disease, no diabetes mellitus and not female    SHATIKA PAPROCKI is a 60 y.o. female with history of HTN, hypercholesteremia, and anxiety who presents to the Emergency Department complaining of intermittent, dull/pressure-like, central chest pain with onset this morning. Prior to the initial onset of pain the pt had eaten a big breakfast and walked 3 miles in her neighborhood.The first episode lasted for 1 hour before she took Maalox and Prilosec with some relief. The pain returned later in the evening and she took more Maalox and Prilosec before also taking xanax without sufficient relief. She has no chest pain at present. Pt denies pain radiation to the back but notes some back discomfort after exercising. Associated symptoms include clamminess.  At home her blood pressure was 140/90 which is high for her. Pt denies vomiting, abdominal pain, SOB, wheezing, headache, dysuria, LE swelling or pain, vision change, extremity weakness, and speech difficulty. No recent long travel. Pt notes being under significant work-related stress.   Past Medical History  Diagnosis Date  . Hypertension   . High cholesterol   . Anxiety    Past Surgical History  Procedure Laterality Date  . Abdominal hysterectomy     History reviewed. No pertinent family history. Social History  Substance Use Topics  . Smoking status: Former Research scientist (life sciences)  . Smokeless tobacco: None  . Alcohol Use: Yes     Comment: wine   OB History    No data available     Review of Systems  Constitutional: Positive for diaphoresis. Negative for fever and chills.  Respiratory: Negative for cough and shortness of breath.   Cardiovascular: Positive for chest pain. Negative for orthopnea and syncope.  Gastrointestinal: Negative for nausea, vomiting and abdominal pain.  Musculoskeletal: Positive for back pain.  Neurological: Negative for dizziness, weakness and numbness.  All other systems reviewed and are negative.   Allergies  Codeine and Sulfa antibiotics  Home Medications   Prior to Admission medications   Medication Sig Start Date End Date Taking? Authorizing Provider  ALPRAZolam (XANAX) 0.5 MG tablet TK 1 T PO  ONCE D PRN 10/31/14  Yes Historical Provider, MD  aspirin EC 81 MG tablet Take 81 mg by mouth daily.   Yes Historical Provider, MD  atorvastatin (LIPITOR) 20 MG tablet TK 1 T  PO QD 10/23/14  Yes Historical Provider, MD  omeprazole (PRILOSEC) 40 MG capsule Take 40 mg by mouth daily. 12/26/14  Yes Historical Provider, MD  valsartan-hydrochlorothiazide (DIOVAN-HCT) 80-12.5 MG tablet Take 1 tablet by mouth daily. Reported on 03/26/2015    Historical Provider, MD   BP 124/103 mmHg  Pulse 70  Temp(Src) 97.5 F (36.4 C) (Oral)  Resp 20  Ht 5\' 3"  (1.6 m)  Wt 177 lb 3  oz (80.372 kg)  BMI 31.40 kg/m2  SpO2 96% Physical Exam  Constitutional: She is oriented to person, place, and time. She appears well-developed and well-nourished. No distress.  HENT:  Head: Normocephalic and atraumatic.  Mouth/Throat: Oropharynx is clear and moist.  Moist mucous membranes No exudate  Eyes: EOM are normal. Pupils are equal, round, and reactive to light.  Neck: Normal range of motion. Neck supple.  Trachea midline No bruit  Cardiovascular: Normal rate and regular rhythm.   Pulses:      Dorsalis pedis pulses are 2+ on the right side, and 2+ on the left side.  Pulmonary/Chest: Effort normal and breath sounds normal. No stridor. No respiratory distress. She has no wheezes. She has no rales.  CTAB  Abdominal: Soft. She exhibits no mass. There is no tenderness. There is no rebound and no guarding.  Musculoskeletal: Normal range of motion. She exhibits no edema or tenderness.  All compartments soft Negative Homans No chords  Lymphadenopathy:    She has no cervical adenopathy.  Neurological: She is alert and oriented to person, place, and time. She has normal reflexes.  Achilles intact   Skin: Skin is warm and dry. She is not diaphoretic.  Psychiatric: She has a normal mood and affect. Her behavior is normal.  Nursing note and vitals reviewed.   ED Course  Procedures (including critical care time) DIAGNOSTIC STUDIES: Oxygen Saturation is 96% on RA,  normal by my interpretation.    COORDINATION OF CARE: 11:57 PM Discussed treatment plan which includes lab work, CXR, EKG, and a GI cocktail with pt at bedside and pt agreed to plan.  1:26 AM-Consult complete with Dr. Claiborne Billings (Cardiology). Patient case explained and discussed. He will see the pt in the ED and would like to be re-paged if he has not seen her by the time the second troponin comes back.   2:12 AM D/w Dr. Claiborne Billings who is on the way to see the pt.   2:20 AM D/w Dr. Claiborne Billings who saw the pt. Pt is safe for  discharge.   Labs Review Labs Reviewed  HEPATIC FUNCTION PANEL - Abnormal; Notable for the following:    Bilirubin, Direct <0.1 (*)    All other components within normal limits  BASIC METABOLIC PANEL  CBC  LIPASE, BLOOD  I-STAT TROPOININ, ED  Randolm Idol, ED    Imaging Review Dg Chest 2 View  03/26/2015  CLINICAL DATA:  Acute onset of centralized chest pain. Initial encounter. EXAM: CHEST  2 VIEW COMPARISON:  Chest radiograph performed 01/07/2013 FINDINGS: The lungs are well-aerated and clear. There is no evidence of focal opacification, pleural effusion or pneumothorax. The heart is normal in size; the mediastinal contour is within normal limits. No acute osseous abnormalities are seen. IMPRESSION: No acute cardiopulmonary process seen. Electronically Signed   By: Garald Balding M.D.   On: 03/26/2015 00:23   I have personally reviewed and evaluated these images and lab results as part of my medical decision-making.   EKG Interpretation   Date/Time:  Sunday March 25 2015 22:59:54 EDT Ventricular Rate:  69 PR Interval:  150 QRS Duration: 84 QT Interval:  402 QTC Calculation: 430 R Axis:   33 Text Interpretation:  Normal sinus rhythm Nonspecific T wave abnormality  Confirmed by [facility]  MD, Sanye Ledesma ([id number]) on 03/25/2015 11:32:40 PM      MDM   Final diagnoses:  Chest pain, unspecified chest pain type    Results for orders placed or performed during the hospital encounter of 0000000  Basic metabolic panel  Result Value Ref Range   Sodium 142 135 - 145 mmol/L   Potassium 3.7 3.5 - 5.1 mmol/L   Chloride 106 101 - 111 mmol/L   CO2 27 22 - 32 mmol/L   Glucose, Bld 97 65 - 99 mg/dL   BUN 13 6 - 20 mg/dL   Creatinine, Ser 0.94 0.44 - 1.00 mg/dL   Calcium 9.5 8.9 - 10.3 mg/dL   GFR calc non Af Amer >60 >60 mL/min   GFR calc Af Amer >60 >60 mL/min   Anion gap 9 5 - 15  CBC  Result Value Ref Range   WBC 7.3 4.0 - 10.5 K/uL   RBC 4.43 3.87 - 5.11 MIL/uL    Hemoglobin 13.5 12.0 - 15.0 g/dL   HCT 41.0 36.0 - 46.0 %   MCV 92.6 78.0 - 100.0 fL   MCH 30.5 26.0 - 34.0 pg   MCHC 32.9 30.0 - 36.0 g/dL   RDW 12.6 11.5 - 15.5 %   Platelets 269 150 - 400 K/uL  Lipase, blood  Result Value Ref Range   Lipase 43 11 - 51 U/L  Hepatic function panel  Result Value Ref Range   Total Protein 6.8 6.5 - 8.1 g/dL   Albumin 4.0 3.5 - 5.0 g/dL   AST 22 15 - 41 U/L   ALT 20 14 - 54 U/L   Alkaline Phosphatase 77 38 - 126 U/L   Total Bilirubin 0.7 0.3 - 1.2 mg/dL   Bilirubin, Direct <0.1 (L) 0.1 - 0.5 mg/dL   Indirect Bilirubin NOT CALCULATED 0.3 - 0.9 mg/dL  I-stat troponin, ED (not at [facility], [facility])  Result Value Ref Range   Troponin i, poc 0.00 0.00 - 0.08 ng/mL   Comment 3           Dg Chest 2 View  03/26/2015  CLINICAL DATA:  Acute onset of centralized chest pain. Initial encounter. EXAM: CHEST  2 VIEW COMPARISON:  Chest radiograph performed 01/07/2013 FINDINGS: The lungs are well-aerated and clear. There is no evidence of focal opacification, pleural effusion or pneumothorax. The heart is normal in size; the mediastinal contour is within normal limits. No acute osseous abnormalities are seen. IMPRESSION: No acute cardiopulmonary process seen. Electronically Signed   By: Garald Balding M.D.   On: 03/26/2015 00:23    Medications  gi cocktail (Maalox,Lidocaine,Donnatal) (30 mLs Oral Given 03/26/15 0025)    Seen by Dr. Claiborne Billings of cardiology  Ruled out for MI with 2 negative troponins.  HEART score 1.  There are no signs of ischemia on EKG.  Patient had a negative cardiac cath several years ago.  She is stable for discharge without further symptoms at this time.  Will follow up in the office with Dr. Marlou Porch for outpatient stress test and follow up.  Strict return precautions given.  Patient and husband verbalize understanding and agree to follow up    I personally performed the services described in this documentation, which was scribed in my presence. The  recorded information has been reviewed and is accurate.      Veatrice Kells, MD 03/26/15 [phone]

## 2015-03-25 NOTE — ED Notes (Addendum)
C/o pressure to center of chest with back pain since this morning.  States she initially thought it was acid reflux.  Denies sob, nausea, and vomiting.  Walked 3 miles today.  Took Xanax pta and pain is currently 1/10.

## 2015-03-26 ENCOUNTER — Encounter (HOSPITAL_COMMUNITY): Payer: Self-pay | Admitting: Emergency Medicine

## 2015-03-26 DIAGNOSIS — R079 Chest pain, unspecified: Secondary | ICD-10-CM

## 2015-03-26 LAB — BASIC METABOLIC PANEL
ANION GAP: 9 (ref 5–15)
BUN: 13 mg/dL (ref 6–20)
CO2: 27 mmol/L (ref 22–32)
Calcium: 9.5 mg/dL (ref 8.9–10.3)
Chloride: 106 mmol/L (ref 101–111)
Creatinine, Ser: 0.94 mg/dL (ref 0.44–1.00)
GFR calc non Af Amer: >60 mL/min (ref >60)
GLUCOSE: 97 mg/dL (ref 65–99)
Potassium: 3.7 mmol/L (ref 3.5–5.1)
SODIUM: 142 mmol/L (ref 135–145)

## 2015-03-26 LAB — I-STAT TROPONIN, ED
TROPONIN I, POC: 0 ng/mL (ref 0.00–0.08)
TROPONIN I, POC: 0 ng/mL (ref 0.00–0.08)

## 2015-03-26 LAB — HEPATIC FUNCTION PANEL
ALK PHOS: 77 U/L (ref 38–126)
ALT: 20 U/L (ref 14–54)
AST: 22 U/L (ref 15–41)
Albumin: 4 g/dL (ref 3.5–5.0)
BILIRUBIN TOTAL: 0.7 mg/dL (ref 0.3–1.2)
Bilirubin, Direct: <0.1 mg/dL — ABNORMAL LOW (ref 0.1–0.5)
Total Protein: 6.8 g/dL (ref 6.5–8.1)

## 2015-03-26 LAB — LIPASE, BLOOD: Lipase: 43 U/L (ref 11–51)

## 2015-03-26 LAB — CBC
HEMATOCRIT: 41 % (ref 36.0–46.0)
Hemoglobin: 13.5 g/dL (ref 12.0–15.0)
MCH: 30.5 pg (ref 26.0–34.0)
MCHC: 32.9 g/dL (ref 30.0–36.0)
MCV: 92.6 fL (ref 78.0–100.0)
Platelets: 269 10*3/uL (ref 150–400)
RBC: 4.43 MIL/uL (ref 3.87–5.11)
RDW: 12.6 % (ref 11.5–15.5)
WBC: 7.3 10*3/uL (ref 4.0–10.5)

## 2015-03-26 MED ORDER — GI COCKTAIL ~~LOC~~
30.0000 mL | Freq: Once | ORAL | Status: AC
  Administered 2015-03-26: 30 mL via ORAL
  Filled 2015-03-26: qty 30

## 2015-03-26 NOTE — Discharge Instructions (Signed)
Cardiac-Specific Troponin I and T Test °WHY AM I HAVING THIS TEST? °You may have this test if you have experienced chest pain. The test can be used to determine if you have had a heart attack or injury to heart (cardiac) muscle. This test can also help predict the possibility of future heart attacks. °This test measures the concentration of cardiac-specific troponin in your blood. Troponins are proteins that help muscles contract. There are three forms of troponin, including troponins C, I, and T. The types of troponins I and T that are found in cardiac muscle are different from the troponins I and T that are found in skeletal muscle. Therefore, testing can be done for cardiac-specific troponins I and T. These types of troponin are normally present in very small quantities in the blood. When there is damage to heart muscle cells, cardiac troponins I and T are released into circulation. The more damage there is, the greater the concentration of troponins I and T. When a person has a heart attack, levels of troponin can become elevated in the blood within 3-4 hours after injury and may remain elevated for 10-14 days. °WHAT KIND OF SAMPLE IS TAKEN? °A blood sample is required for this test. It is usually collected by inserting a needle into a vein. Usually, an initial blood sample is collected, and then another blood sample is collected 12 hours later. After these samples, you will have your blood tested daily for 3-5 days. You might also have it tested weekly for 5-6 weeks. °HOW DO I PREPARE FOR THE TEST? °There is no preparation required for this test. However, be aware that you will need to make arrangements to have your blood collected frequently.  °WHAT ARE THE REFERENCE RANGES? °Reference values are considered healthy values established after testing a large group of healthy people. Reference values may vary among different people, labs, and hospitals. It is your responsibility to obtain your test results. Ask  the lab or department performing the test when and how you will get your results. °Reference values for cardiac troponins are as follows: °· Cardiac troponin T: less than 0.1 ng/mL. °· Cardiac troponin I: less than 0.03 ng/mL. °WHAT DO THE RESULTS MEAN? °Troponin values above the reference values may indicate: °· Injury to the heart muscle. °· Heart attack. °Talk with your health care provider to discuss your results, treatment options, and if necessary, the need for more tests. Talk with your health care provider if you have any questions about your results. °  °This information is not intended to replace advice given to you by your health care provider. Make sure you discuss any questions you have with your health care provider. °  °Document Released: 02/02/2004 Document Revised: 01/20/2014 Document Reviewed: 05/25/2013 °Elsevier Interactive Patient Education ©2016 [employer] ° °

## 2015-03-26 NOTE — ED Notes (Signed)
Spoke to lab, they are adding hepatic function to the BMP which will make it a CMP and running a lipase as well.

## 2015-03-26 NOTE — Consult Note (Signed)
Reason for Consult: chest pain Primary Cardiologist: Dr. Marlou Porch Referring Physician: Dr. Jabier Mutton is an 60 y.o. female.  HPI: Tammy Weeks is a 60 yo woman with PMh of dyslipidemia, hypertension and anxiety who presented to [facility] ER with intermittent chest discomfort that began this AM 3/12. She characterized the discomfort as dull/pressure-like sensation that began after eating a large breakfast and 3 mile neighborhood walk. Her initial discomfort lasted ~ 1 hour before she took PPI and maalox which led to some relief. The pain came back later in the evening and second dose of PPI/maalox helped less while xanax provided some relief. On presentation tot he ER she had no chest pain. She is accompanied by her husband. She says she's had panic attacks previously and this discomfort was similar. She had a negative cardiac cath 15 years ago. Initial and delta troponin negative, EKG SR with subtle nonspecific inferior/lateral ST changes. She has no exercise intolerance or angina with exercise by history. No recent infectious symptoms, no weight loss or weight gain.   Past Medical History  Diagnosis Date  . Hypertension   . High cholesterol   . Anxiety     Past Surgical History  Procedure Laterality Date  . Abdominal hysterectomy      History reviewed. No pertinent family history.  Social History:  reports that she has quit smoking. She does not have any smokeless tobacco history on file. She reports that she drinks alcohol. She reports that she does not use illicit drugs.  Allergies:  Allergies  Allergen Reactions  . Codeine Rash  . Sulfa Antibiotics Rash    Medications: I have reviewed the patient's current medications. Prior to Admission:  (Not in a hospital admission) Scheduled:  Results for orders placed or performed during the hospital encounter of 03/25/15 (from the past 48 hour(s))  I-stat troponin, ED (not at [facility], [facility])     Status: None   Collection Time: 03/25/15 11:39 PM  Result Value Ref Range   Troponin i, poc 0.00 0.00 - 0.08 ng/mL   Comment 3            Comment: Due to the release kinetics of cTnI, a negative result within the first hours of the onset of symptoms does not rule out myocardial infarction with certainty. If myocardial infarction is still suspected, repeat the test at appropriate intervals.   Basic metabolic panel     Status: None   Collection Time: 03/25/15 11:40 PM  Result Value Ref Range   Sodium 142 135 - 145 mmol/L   Potassium 3.7 3.5 - 5.1 mmol/L   Chloride 106 101 - 111 mmol/L   CO2 27 22 - 32 mmol/L   Glucose, Bld 97 65 - 99 mg/dL   BUN 13 6 - 20 mg/dL   Creatinine, Ser 0.94 0.44 - 1.00 mg/dL   Calcium 9.5 8.9 - 10.3 mg/dL   GFR calc non Af Amer >60 >60 mL/min   GFR calc Af Amer >60 >60 mL/min    Comment: (NOTE) The eGFR has been calculated using the CKD EPI equation. This calculation has not been validated in all clinical situations. eGFR's persistently <60 mL/min signify possible Chronic Kidney Disease.    Anion gap 9 5 - 15  CBC     Status: None   Collection Time: 03/25/15 11:40 PM  Result Value Ref Range   WBC 7.3 4.0 - 10.5 K/uL   RBC 4.43 3.87 - 5.11 MIL/uL   Hemoglobin 13.5  12.0 - 15.0 g/dL   HCT 41.0 36.0 - 46.0 %   MCV 92.6 78.0 - 100.0 fL   MCH 30.5 26.0 - 34.0 pg   MCHC 32.9 30.0 - 36.0 g/dL   RDW 12.6 11.5 - 15.5 %   Platelets 269 150 - 400 K/uL  Hepatic function panel     Status: Abnormal   Collection Time: 03/25/15 11:40 PM  Result Value Ref Range   Total Protein 6.8 6.5 - 8.1 g/dL   Albumin 4.0 3.5 - 5.0 g/dL   AST 22 15 - 41 U/L   ALT 20 14 - 54 U/L   Alkaline Phosphatase 77 38 - 126 U/L   Total Bilirubin 0.7 0.3 - 1.2 mg/dL   Bilirubin, Direct <0.1 (L) 0.1 - 0.5 mg/dL   Indirect Bilirubin NOT CALCULATED 0.3 - 0.9 mg/dL  Lipase, blood     Status: None   Collection Time: 03/26/15 12:05 AM  Result Value Ref Range   Lipase 43 11 - 51 U/L    Dg Chest 2  View  03/26/2015  CLINICAL DATA:  Acute onset of centralized chest pain. Initial encounter. EXAM: CHEST  2 VIEW COMPARISON:  Chest radiograph performed 01/07/2013 FINDINGS: The lungs are well-aerated and clear. There is no evidence of focal opacification, pleural effusion or pneumothorax. The heart is normal in size; the mediastinal contour is within normal limits. No acute osseous abnormalities are seen. IMPRESSION: No acute cardiopulmonary process seen. Electronically Signed   By: Garald Balding M.D.   On: 03/26/2015 00:23    Review of Systems  Constitutional: Negative for fever, chills and weight loss.  HENT: Negative for hearing loss and tinnitus.   Eyes: Negative for double vision, photophobia and pain.  Respiratory: Negative for hemoptysis, sputum production and shortness of breath.   Cardiovascular: Positive for chest pain. Negative for orthopnea, leg swelling and PND.  Gastrointestinal: Positive for heartburn. Negative for nausea, vomiting and diarrhea.  Genitourinary: Negative for dysuria, urgency and frequency.  Musculoskeletal: Negative for myalgias, back pain and neck pain.  Neurological: Negative for dizziness, tingling and headaches.  Endo/Heme/Allergies: Negative for polydipsia. Does not bruise/bleed easily.  Psychiatric/Behavioral: Negative for depression, suicidal ideas and substance abuse.   Blood pressure 121/72, pulse 118, temperature 97.5 F (36.4 C), temperature source Oral, resp. rate 18, height '5\' 3"'  (1.6 m), weight 80.372 kg (177 lb 3 oz), SpO2 98 %. Physical Exam  Nursing note and vitals reviewed. Constitutional: She is oriented to person, place, and time. She appears well-developed and well-nourished. No distress.  HENT:  Head: Normocephalic and atraumatic.  Nose: Nose normal.  Mouth/Throat: Oropharynx is clear and moist. No oropharyngeal exudate.  Eyes: Conjunctivae and EOM are normal. Pupils are equal, round, and reactive to light. No scleral icterus.  Neck:  Normal range of motion. Neck supple. No JVD present. No tracheal deviation present.  Cardiovascular: Normal rate, regular rhythm, normal heart sounds and intact distal pulses.  Exam reveals no gallop.   No murmur heard. Respiratory: Effort normal and breath sounds normal. No respiratory distress. She has no wheezes. She has no rales.  GI: Soft. Bowel sounds are normal. She exhibits no distension. There is no tenderness. There is no rebound.  Musculoskeletal: Normal range of motion. She exhibits no edema or tenderness.  Neurological: She is alert and oriented to person, place, and time. No cranial nerve deficit. Coordination normal.  Skin: Skin is warm and dry. No rash noted. She is not diaphoretic. No erythema.  Psychiatric: She has  a normal mood and affect. Her behavior is normal. Thought content normal.   Labs reviewed; trop 0.00, bun/cr 13/0.94, na 140s, K 3.7, wbc 7.3, h/h 13/31 EKG: nonspecific ST changes inferiorly/laterally, SR  Assessment/Plan: Ms. Anjannette Gauger is a 60 yo woman with PMh of dyslipidemia, hypertension and anxiety who presented to [facility] ER with intermittent chest discomfort that began this AM 3/12. Differential diagnosis for her chest discomfort includes musculoskeletal pain, esophageal spasm, GERD, pericarditis, ACS/NSTEMI among other etiologies. I favor a diagnosis of atypical chest pain given good exercise tolerance, improvement in symptoms with GI cocktail and negative troponins. Her ECG is nonspecific but mildly abnormal. I discussed with Tammy Weeks that given atypical symptoms, despite negative troponin, that follow-up with Cardiology (? Dr. Marlou Porch) fairly soon with a stress test would be ideal. She will call the office tomorrow and arrange follow-up.   Problem List Chest Pain Dyslipidemia Hypertension  Plan 1. Follow-up with Dr. Marlou Porch and likely exercise MPI soon 2. Continue to walk daily and stay active; warning signs discussed 3. Continue asa 81 mg,  PPI and atorvastatin 20 mg     Denee Boeder 03/26/2015, 1:44 AM

## 2015-04-30 ENCOUNTER — Encounter: Payer: Self-pay | Admitting: Cardiology

## 2015-04-30 ENCOUNTER — Ambulatory Visit (INDEPENDENT_AMBULATORY_CARE_PROVIDER_SITE_OTHER): Payer: Managed Care, Other (non HMO) | Admitting: Cardiology

## 2015-04-30 VITALS — BP 116/74 | HR 68 | Ht 62.5 in | Wt 174.0 lb

## 2015-04-30 DIAGNOSIS — E785 Hyperlipidemia, unspecified: Secondary | ICD-10-CM | POA: Diagnosis not present

## 2015-04-30 DIAGNOSIS — R9431 Abnormal electrocardiogram [ECG] [EKG]: Secondary | ICD-10-CM | POA: Diagnosis not present

## 2015-04-30 DIAGNOSIS — I10 Essential (primary) hypertension: Secondary | ICD-10-CM

## 2015-04-30 DIAGNOSIS — R0789 Other chest pain: Secondary | ICD-10-CM | POA: Diagnosis not present

## 2015-04-30 NOTE — Progress Notes (Signed)
Cardiology Office Note    Date:  04/30/2015   ID:  Romelle Starcher, DOB [id number], MRN [id number]  PCP:  Wenda Low, MD  Cardiologist:   Candee Furbish, MD     History of Present Illness:  Tammy Weeks is a 60 y.o. female here for evaluation of chest pain at the request of Dr. Lysle Rubens. (I have taken care of her husband as well, local ENT physician). She was seen in the hospital setting on 03/26/15 by Dr. Jules Husbands because of chest discomfort. She reported intermittent chest discomfort that began in the morning hours described as a dull pressure-like sensation that began after eating a large breakfast and 3 miles walk. It lasted for about an hour and she took PPI and Maalox which led to some relief. It then came back later on that evening and took a second dose of Maalox but this helped less while Xanax provided some relief. She came to the ER and did not have any chest pain at that time. She's had panic attacks previously and this discomfort was similar. Approximate 15 years ago she had a cardiac catheterization that was reassuring. Troponins were normal in the emergency room thankfully. She had subtle nonspecific inferolateral ST changes on EKG. She was discharged from the emergency room and encouraged to see [place] in follow-up. She is continuing aspirin and atorvastatin.  Her husband states that on a prior EKG there has been a comment about indeterminant infarct. I do not see an old infarct pattern on her current EKG. Explained lead placement.  She has not had any chest discomfort since this episode. She is quite active and was running up until about 6 months ago after sustaining a foot injury she had to stop. She does walk however up to 10 miles per week without any symptoms.  She is a former smoker. Father had MI 91.  There is been increased job stress at work.    Past Medical History  Diagnosis Date  . Hypertension   . High cholesterol   . Anxiety     Past Surgical  History  Procedure Laterality Date  . Abdominal hysterectomy      Current Medications: Outpatient Prescriptions Prior to Visit  Medication Sig Dispense Refill  . ALPRAZolam (XANAX) 0.5 MG tablet take one by mouth daily as needed for anxiety  3  . aspirin EC 81 MG tablet Take 81 mg by mouth daily.    Marland Kitchen atorvastatin (LIPITOR) 20 MG tablet take one by mouth daily  7  . omeprazole (PRILOSEC) 40 MG capsule Take 40 mg by mouth daily.  3  . valsartan-hydrochlorothiazide (DIOVAN-HCT) 80-12.5 MG tablet Take 1 tablet by mouth daily. Reported on 03/26/2015     No facility-administered medications prior to visit.     Allergies:   Codeine and Sulfa antibiotics   Social History   Social History  . Marital Status: Married    Spouse Name: N/A  . Number of Children: N/A  . Years of Education: N/A   Social History Main Topics  . Smoking status: Former Research scientist (life sciences)  . Smokeless tobacco: None  . Alcohol Use: Yes     Comment: wine  . Drug Use: No  . Sexual Activity: Not Asked   Other Topics Concern  . None   Social History Narrative    Former smoker years ago.  Family History:  The patient's father had MI 58.  ROS:   Please see the history of present illness.    ROS All  other systems reviewed and are negative.   PHYSICAL EXAM:   VS:  BP 116/74 mmHg  Pulse 68  Ht 5' 2.5" (1.588 m)  Wt 174 lb (78.926 kg)  BMI 31.30 kg/m2   GEN: Well nourished, well developed, in no acute distress HEENT: normal Neck: no JVD, carotid bruits, or masses Cardiac: RRR; no murmurs, rubs, or gallops,no edema  Respiratory:  clear to auscultation bilaterally, normal work of breathing GI: soft, nontender, nondistended, + BS MS: no deformity or atrophy Skin: warm and dry, no rash Neuro:  Alert and Oriented x 3, Strength and sensation are intact Psych: euthymic mood, full affect  Wt Readings from Last 3 Encounters:  04/30/15 174 lb (78.926 kg)  03/25/15 177 lb 3 oz (80.372 kg)  12/01/14 160 lb (72.576 kg)       Studies/Labs Reviewed:   EKG:  EKG is not ordered today.  EKG from 03/26/15 demonstrates sinus rhythm with nonspecific ST changes inferior laterally, personally viewed.  Recent Labs: 03/25/2015: ALT 20; BUN 13; Creatinine, Ser 0.94; Hemoglobin 13.5; Platelets 269; Potassium 3.7; Sodium 142   Lipid Panel No results found for: CHOL, TRIG, HDL, CHOLHDL, VLDL, LDLCALC, LDLDIRECT  Additional studies/ records that were reviewed today include:  ER visit reviewed, EKG reviewed, troponins normal, lab work reviewed.Chest x-ray, personally viewed, no active disease.    ASSESSMENT:    1. Atypical chest pain   2. Hyperlipidemia   3. Essential hypertension      PLAN:  In order of problems listed above:  Atypical chest pain -Reassuring troponin, ER visit, mildly abnormal EKG with nonspecific ST-T wave changes inferiorly and laterally.Differential does include skeletal, GERD, cardiac -We will go ahead and set up for a nuclear stress test. Prior cardiac catheterization 15 years ago reassuring.Dr. Fabio Asa.   Essential hypertension -Well controlled with current medication strategy.  Hyperlipidemia -Atorvastatin 20 mg. Excellent.  GERD -Omeprazole   Medication Adjustments/Labs and Tests Ordered: Current medicines are reviewed at length with the patient today.  Concerns regarding medicines are outlined above.  Medication changes, Labs and Tests ordered today are listed in the Patient Instructions below. Patient Instructions  Medication Instructions:  Your physician recommends that you continue on your current medications as directed. Please refer to the Current Medication list given to you today.  Labwork: NONE  Testing/Procedures: Your physician has requested that you have en exercise stress myoview. For further information please visit [email or web address]. Please follow instruction sheet, as given.  Follow-Up: Your physician wants you to follow-up as needed.  If you need  a refill on your cardiac medications before your next appointment, please call your pharmacy.       Bobby Rumpf, MD  04/30/2015 12:16 PM    [address], [place], [place]  [address] Phone: [phone]; Fax: [phone]

## 2015-04-30 NOTE — Patient Instructions (Addendum)
Medication Instructions:  Your physician recommends that you continue on your current medications as directed. Please refer to the Current Medication list given to you today.  Labwork: NONE  Testing/Procedures: Your physician has requested that you have en exercise stress myoview. For further information please visit [email or web address]. Please follow instruction sheet, as given.  Follow-Up: Your physician wants you to follow-up as needed.  If you need a refill on your cardiac medications before your next appointment, please call your pharmacy.

## 2015-05-02 ENCOUNTER — Telehealth (HOSPITAL_COMMUNITY): Payer: Self-pay | Admitting: *Deleted

## 2015-05-02 NOTE — Telephone Encounter (Signed)
Left message on voicemail in reference to upcoming appointment scheduled for 05/07/15. Phone number given for a call back so details instructions can be given. Hubbard Robinson, RN

## 2015-05-03 ENCOUNTER — Telehealth (HOSPITAL_COMMUNITY): Payer: Self-pay | Admitting: *Deleted

## 2015-05-03 NOTE — Telephone Encounter (Signed)
Left message on voicemail in reference to upcoming appointment scheduled for 05/07/15. Phone number given for a call back so details instructions can be given. Tammy Weeks, Tammy Weeks

## 2015-05-03 NOTE — Telephone Encounter (Signed)
Patient given detailed instructions per Myocardial Perfusion Study Information Sheet for the test on 05/07/15 at 7:30. Patient notified to arrive 15 minutes early and that it is imperative to arrive on time for appointment to keep from having the test rescheduled.  If you need to cancel or reschedule your appointment, please call the office within 24 hours of your appointment. Failure to do so may result in a cancellation of your appointment, and a $50 no show fee. Patient verbalized understanding.Tammy Weeks

## 2015-05-07 ENCOUNTER — Ambulatory Visit (HOSPITAL_COMMUNITY): Payer: Managed Care, Other (non HMO) | Attending: Cardiovascular Disease

## 2015-05-07 DIAGNOSIS — R0609 Other forms of dyspnea: Secondary | ICD-10-CM | POA: Diagnosis not present

## 2015-05-07 DIAGNOSIS — Z8249 Family history of ischemic heart disease and other diseases of the circulatory system: Secondary | ICD-10-CM | POA: Insufficient documentation

## 2015-05-07 DIAGNOSIS — I10 Essential (primary) hypertension: Secondary | ICD-10-CM | POA: Insufficient documentation

## 2015-05-07 DIAGNOSIS — R0789 Other chest pain: Secondary | ICD-10-CM

## 2015-05-07 DIAGNOSIS — E785 Hyperlipidemia, unspecified: Secondary | ICD-10-CM | POA: Diagnosis not present

## 2015-05-07 MED ORDER — TECHNETIUM TC 99M SESTAMIBI GENERIC - CARDIOLITE
33.0000 | Freq: Once | INTRAVENOUS | Status: AC | PRN
  Administered 2015-05-07: 33 via INTRAVENOUS

## 2015-05-10 ENCOUNTER — Ambulatory Visit (HOSPITAL_COMMUNITY): Payer: Managed Care, Other (non HMO) | Attending: Cardiology

## 2015-05-10 LAB — MYOCARDIAL PERFUSION IMAGING
CHL CUP MPHR: 158 {beats}/min
CHL CUP NUCLEAR SDS: 4
CHL CUP NUCLEAR SRS: 9
CHL CUP RESTING HR STRESS: 57 {beats}/min
CSEPEW: 12.5 METS
CSEPHR: 93 %
Exercise duration (min): 10 min
Exercise duration (sec): 30 s
LV sys vol: 26 mL
LVDIAVOL: 75 mL (ref 46–106)
Peak HR: 148 {beats}/min
RATE: 0.3
SSS: 13
TID: 1.09

## 2015-05-10 MED ORDER — TECHNETIUM TC 99M SESTAMIBI GENERIC - CARDIOLITE
30.6000 | Freq: Once | INTRAVENOUS | Status: AC | PRN
  Administered 2015-05-10: 31 via INTRAVENOUS

## 2015-05-11 ENCOUNTER — Telehealth: Payer: Self-pay | Admitting: Cardiology

## 2015-05-11 NOTE — Telephone Encounter (Signed)
°  Follow Up  Pt state she received her test results on MyChart but would like someone to review them with her. Please call.

## 2015-05-14 NOTE — Telephone Encounter (Signed)
Reviewed results of myoview stress testing with pt who stated understanding

## 2015-07-13 ENCOUNTER — Ambulatory Visit (INDEPENDENT_AMBULATORY_CARE_PROVIDER_SITE_OTHER): Payer: Managed Care, Other (non HMO) | Admitting: Sports Medicine

## 2015-07-13 ENCOUNTER — Ambulatory Visit (INDEPENDENT_AMBULATORY_CARE_PROVIDER_SITE_OTHER): Payer: Managed Care, Other (non HMO)

## 2015-07-13 ENCOUNTER — Encounter: Payer: Self-pay | Admitting: Sports Medicine

## 2015-07-13 VITALS — BP 138/92 | HR 59 | Wt 187.0 lb

## 2015-07-13 DIAGNOSIS — M2241 Chondromalacia patellae, right knee: Secondary | ICD-10-CM

## 2015-07-13 DIAGNOSIS — M25762 Osteophyte, left knee: Secondary | ICD-10-CM | POA: Diagnosis not present

## 2015-07-13 DIAGNOSIS — M170 Bilateral primary osteoarthritis of knee: Secondary | ICD-10-CM | POA: Insufficient documentation

## 2015-07-13 DIAGNOSIS — M25761 Osteophyte, right knee: Secondary | ICD-10-CM

## 2015-07-13 DIAGNOSIS — M1711 Unilateral primary osteoarthritis, right knee: Secondary | ICD-10-CM

## 2015-07-13 MED ORDER — MELOXICAM 15 MG PO TABS: ORAL_TABLET | ORAL | Status: DC

## 2015-07-13 NOTE — Assessment & Plan Note (Signed)
With a mild effusion. Weak hip Abductor's Injection, meloxicam, x-rays, formal physical therapy for vastus medialis and hip abductor strengthening. There was some left trochanteric bursitis. Return to see me in one month, we will consider orthotics if no better.

## 2015-07-13 NOTE — Progress Notes (Signed)
   Subjective:    I'm seeing this patient as a consultation for:  Dr. Wenda Low  CC: Right knee pain  HPI: This is a pleasant 60 year old female, she is a runner, recently she developed some swelling and pain under her kneecap, worse going up and down stairs, squatting, after a recent run. Symptoms are moderate, persistent without radiation, no constitutional symptoms, no trauma, no mechanical symptoms.  Past medical history, Surgical history, Family history not pertinant except as noted below, Social history, Allergies, and medications have been entered into the medical record, reviewed, and no changes needed.   Review of Systems: No headache, visual changes, nausea, vomiting, diarrhea, constipation, dizziness, abdominal pain, skin rash, fevers, chills, night sweats, weight loss, swollen lymph nodes, body aches, joint swelling, muscle aches, chest pain, shortness of breath, mood changes, visual or auditory hallucinations.   Objective:   General: Well Developed, well nourished, and in no acute distress.  Neuro/Psych: Alert and oriented x3, extra-ocular muscles intact, able to move all 4 extremities, sensation grossly intact. Skin: Warm and dry, no rashes noted.  Respiratory: Not using accessory muscles, speaking in full sentences, trachea midline.  Cardiovascular: Pulses palpable, no extremity edema. Abdomen: Does not appear distended. Right Knee: Visible effusion with a palpable fluid wave and tenderness under the medial and lateral patellar facets ROM normal in flexion and extension and lower leg rotation. Ligaments with solid consistent endpoints including ACL, PCL, LCL, MCL. Negative Mcmurray's and provocative meniscal tests. Non painful patellar compression. Patellar and quadriceps tendons unremarkable. Hamstring and quadriceps strength is normal. Weakness of the right hip abductor's worse than the left.  Procedure: Real-time Ultrasound Guided Injection of right knee Device:  GE Logiq E  Verbal informed consent obtained.  Time-out conducted.  Noted no overlying erythema, induration, or other signs of local infection.  Skin prepped in a sterile fashion.  Local anesthesia: Topical Ethyl chloride.  With sterile technique and under real time ultrasound guidance:  I entered the suprapatellar recess where there was a small effusion, and injected 1 mL Kenalog 40, 2 mL lidocaine, 2 mL Marcaine. Completed without difficulty  Pain immediately resolved suggesting accurate placement of the medication.  Advised to call if fevers/chills, erythema, induration, drainage, or persistent bleeding.  Images permanently stored and available for review in the ultrasound unit.  Impression: Technically successful ultrasound guided injection.  Impression and Recommendations:   This case required medical decision making of moderate complexity.

## 2015-07-16 ENCOUNTER — Telehealth: Payer: Self-pay | Admitting: *Deleted

## 2015-07-16 NOTE — Telephone Encounter (Signed)
error 

## 2015-08-10 ENCOUNTER — Encounter: Payer: Self-pay | Admitting: Sports Medicine

## 2015-08-10 ENCOUNTER — Ambulatory Visit (INDEPENDENT_AMBULATORY_CARE_PROVIDER_SITE_OTHER): Payer: Managed Care, Other (non HMO) | Admitting: Sports Medicine

## 2015-08-10 ENCOUNTER — Telehealth: Payer: Self-pay | Admitting: Sports Medicine

## 2015-08-10 DIAGNOSIS — M1711 Unilateral primary osteoarthritis, right knee: Secondary | ICD-10-CM

## 2015-08-10 NOTE — Telephone Encounter (Signed)
Submitted for approval on Orthovisc. Awaiting confirmation.  

## 2015-08-10 NOTE — Assessment & Plan Note (Signed)
Did well after injection but overdid it, having a recurrence of pain. Desires repeat injection, this will be done today. I do want to set her up for Viscosupplementation however we will also get an MRI beforehand, she does have some posterior/medial joint line pain. She also needs to return for custom orthotics. Has not taken her meloxicam, she will start this as well.

## 2015-08-10 NOTE — Progress Notes (Signed)
  Subjective:    CC: Follow-up  HPI: Right knee pain: Primary osteoarthritis in all 3 compartments on x-ray, previous injection provided good relief, she never took her meloxicam. Unfortunately she overdid it exercising and had a recent wedding and has a recurrence of pain, minimal swelling. She does desire interventional treatment today and is agreeable to proceed with viscous supplementation. She has also not yet obtained her custom orthotics.  Past medical history, Surgical history, Family history not pertinant except as noted below, Social history, Allergies, and medications have been entered into the medical record, reviewed, and no changes needed.   Review of Systems: No fevers, chills, night sweats, weight loss, chest pain, or shortness of breath.   Objective:    General: Well Developed, well nourished, and in no acute distress.  Neuro: Alert and oriented x3, extra-ocular muscles intact, sensation grossly intact.  HEENT: Normocephalic, atraumatic, pupils equal round reactive to light, neck supple, no masses, no lymphadenopathy, thyroid nonpalpable.  Skin: Warm and dry, no rashes. Cardiac: Regular rate and rhythm, no murmurs rubs or gallops, no lower extremity edema.  Respiratory: Clear to auscultation bilaterally. Not using accessory muscles, speaking in full sentences. Right Knee: Trace effusion, tender to palpation at the medial joint line ROM normal in flexion and extension and lower leg rotation. Ligaments with solid consistent endpoints including ACL, PCL, LCL, MCL. Negative Mcmurray's and provocative meniscal tests. Non painful patellar compression. Patellar and quadriceps tendons unremarkable. Hamstring and quadriceps strength is normal.  Procedure: Real-time Ultrasound Guided Injection of right knee Device: GE Logiq E  Verbal informed consent obtained.  Time-out conducted.  Noted no overlying erythema, induration, or other signs of local infection.  Skin prepped in a  sterile fashion.  Local anesthesia: Topical Ethyl chloride.  With sterile technique and under real time ultrasound guidance:  1/2 mL Kenalog 3, 2 mL lidocaine, 2 mL Marcaine injected easily. Completed without difficulty  Pain immediately resolved suggesting accurate placement of the medication.  Advised to call if fevers/chills, erythema, induration, drainage, or persistent bleeding.  Images permanently stored and available for review in the ultrasound unit.  Impression: Technically successful ultrasound guided injection.  Impression and Recommendations:    Primary osteoarthritis of right knee Did well after injection but overdid it, having a recurrence of pain. Desires repeat injection, this will be done today. I do want to set her up for Viscosupplementation however we will also get an MRI beforehand, she does have some posterior/medial joint line pain. She also needs to return for custom orthotics. Has not taken her meloxicam, she will start this as well.

## 2015-08-10 NOTE — Telephone Encounter (Signed)
-----   Message from Silverio Decamp, MD sent at 08/10/2015 11:06 AM EDT ----- Right knee x-ray confirmed osteoarthritis, Orthovisc approval. Preesh Boogs. ___________________________________________ Gwen Her. Dianah Field, M.D., ABFM., CAQSM. Primary Care and [place] Instructor of [place] [employer] [facility] [employer]

## 2015-08-13 NOTE — Telephone Encounter (Signed)
Noted, thank you

## 2015-08-13 NOTE — Telephone Encounter (Signed)
Received the following information from OV benefits investigation:   Patient has OAP plan with an effective date of 01/14/2015. A pre-certification is needed; call [phone] to obtain. [id number] is covered at 100% & [id number] is covered at 100% of the contracted rate when performed in an office setting. if out of pocket is met, copay will no longer apply. A copay of $40.00 applies whether or not a specialist office is billed. REF: [place], spoke with [place]. No pre-cert required. Will fax over letter to office.  Left VM for Pt advising of approval, provided information for her to call and schedule.  Will route to treating Provider. May buy and bill.

## 2015-08-20 ENCOUNTER — Encounter: Payer: Managed Care, Other (non HMO) | Admitting: Sports Medicine

## 2015-08-31 ENCOUNTER — Encounter: Payer: Self-pay | Admitting: Sports Medicine

## 2015-08-31 ENCOUNTER — Ambulatory Visit (INDEPENDENT_AMBULATORY_CARE_PROVIDER_SITE_OTHER): Payer: Managed Care, Other (non HMO) | Admitting: Sports Medicine

## 2015-08-31 DIAGNOSIS — M1711 Unilateral primary osteoarthritis, right knee: Secondary | ICD-10-CM | POA: Diagnosis not present

## 2015-08-31 MED ORDER — DICLOFENAC SODIUM 75 MG PO TBEC: 75.0000 mg | DELAYED_RELEASE_TABLET | Freq: Two times a day (BID) | ORAL | 3 refills | Status: DC

## 2015-08-31 NOTE — Assessment & Plan Note (Signed)
Custom orthotics as above. Approved for Orthovisc but we will hold off on this for now.  Adding an MRI, referral to Dr. Alvan Dame. Switching to Voltaren.  Return to see me with ready to start the injections.

## 2015-08-31 NOTE — Progress Notes (Signed)

## 2015-09-09 ENCOUNTER — Ambulatory Visit
Admission: RE | Admit: 2015-09-09 | Discharge: 2015-09-09 | Disposition: A | Discharge status: Home / Self Care | Payer: Managed Care, Other (non HMO) | Source: Ambulatory Visit | Attending: Sports Medicine | Admitting: Sports Medicine

## 2015-09-09 DIAGNOSIS — M1711 Unilateral primary osteoarthritis, right knee: Secondary | ICD-10-CM

## 2015-09-10 ENCOUNTER — Encounter: Payer: Self-pay | Admitting: Sports Medicine

## 2015-09-13 ENCOUNTER — Encounter: Payer: Self-pay | Admitting: Sports Medicine

## 2015-09-13 ENCOUNTER — Ambulatory Visit (INDEPENDENT_AMBULATORY_CARE_PROVIDER_SITE_OTHER): Payer: Managed Care, Other (non HMO) | Admitting: Sports Medicine

## 2015-09-13 DIAGNOSIS — M1711 Unilateral primary osteoarthritis, right knee: Secondary | ICD-10-CM | POA: Diagnosis not present

## 2015-09-13 NOTE — Progress Notes (Signed)

## 2015-09-13 NOTE — Assessment & Plan Note (Signed)
Orthovisc injection number one, return in one week for #2 of 4.

## 2015-09-21 ENCOUNTER — Ambulatory Visit (INDEPENDENT_AMBULATORY_CARE_PROVIDER_SITE_OTHER): Payer: Managed Care, Other (non HMO) | Admitting: Sports Medicine

## 2015-09-21 DIAGNOSIS — M1711 Unilateral primary osteoarthritis, right knee: Secondary | ICD-10-CM | POA: Diagnosis not present

## 2015-09-21 NOTE — Assessment & Plan Note (Signed)
Orthovisc injection #2 of 4 into the right knee, return in one week for #3. 

## 2015-09-21 NOTE — Progress Notes (Signed)

## 2015-09-27 ENCOUNTER — Ambulatory Visit (INDEPENDENT_AMBULATORY_CARE_PROVIDER_SITE_OTHER): Payer: Managed Care, Other (non HMO) | Admitting: Sports Medicine

## 2015-09-27 DIAGNOSIS — M1711 Unilateral primary osteoarthritis, right knee: Secondary | ICD-10-CM

## 2015-09-27 NOTE — Assessment & Plan Note (Signed)
Orthovisc injection #3 of 4 into the right knee, return in one week for #4 

## 2015-09-27 NOTE — Progress Notes (Signed)

## 2015-09-28 ENCOUNTER — Ambulatory Visit: Payer: Managed Care, Other (non HMO) | Admitting: Sports Medicine

## 2015-10-04 ENCOUNTER — Encounter: Payer: Self-pay | Admitting: Sports Medicine

## 2015-10-04 ENCOUNTER — Ambulatory Visit (INDEPENDENT_AMBULATORY_CARE_PROVIDER_SITE_OTHER): Payer: Managed Care, Other (non HMO) | Admitting: Sports Medicine

## 2015-10-04 DIAGNOSIS — M1711 Unilateral primary osteoarthritis, right knee: Secondary | ICD-10-CM

## 2015-10-04 MED ORDER — DICLOFENAC SODIUM 75 MG PO TBEC: 75.0000 mg | DELAYED_RELEASE_TABLET | Freq: Two times a day (BID) | ORAL | 3 refills | Status: AC

## 2015-10-04 NOTE — Assessment & Plan Note (Signed)
Orthovisc No. 4 into the right knee, doing well, return in one month. Restarting diclofenac, is not taking meloxicam.

## 2015-10-04 NOTE — Progress Notes (Signed)

## 2015-11-02 ENCOUNTER — Ambulatory Visit: Payer: Managed Care, Other (non HMO) | Admitting: Sports Medicine

## 2017-03-27 ENCOUNTER — Encounter: Payer: Self-pay | Admitting: Podiatry

## 2017-03-27 ENCOUNTER — Ambulatory Visit (INDEPENDENT_AMBULATORY_CARE_PROVIDER_SITE_OTHER): Payer: Managed Care, Other (non HMO) | Admitting: Podiatry

## 2017-03-27 DIAGNOSIS — L03032 Cellulitis of left toe: Secondary | ICD-10-CM | POA: Diagnosis not present

## 2017-03-27 NOTE — Patient Instructions (Signed)

## 2017-04-02 NOTE — Progress Notes (Signed)
Subjective:   Patient ID: Tammy Weeks, female   DOB: 62 y.o.   MRN: [id number]   HPI Patient presents stating that this ingrown toenail has been bothering her left foot and I am due to go on vacation and I was concerned about it.  Patient does not smoke and likes to be active   ROS      Objective:  Physical Exam  Neurovascular status intact muscle strength adequate range of motion within normal limits with patient found to have left hallux lateral border that is incurvated with slight distal redness and slight bit of drainage formation is localized.  No proximal edema erythema or drainage was noted     Assessment:  Mild paronychia of the left hallux with pain     Plan:  H&P condition discussed and at this point I recommended careful removal of the corner and allowing channel for drainage explaining this will probably give her best long-term result.  Patient wants procedure and today I infiltrated the left hallux 60 mg like Marcaine mixture removed a small part of the lateral border created a channel for drainage and its explained soaks to the patient.  Applied sterile dressing and patient will be seen back if symptoms persist we will have to consider permanent procedure

## 2017-04-03 ENCOUNTER — Ambulatory Visit: Payer: PRIVATE HEALTH INSURANCE | Admitting: Podiatry

## 2017-05-14 ENCOUNTER — Other Ambulatory Visit: Payer: Self-pay | Admitting: Podiatry

## 2017-05-14 ENCOUNTER — Ambulatory Visit: Payer: Managed Care, Other (non HMO) | Admitting: Podiatry

## 2017-05-14 ENCOUNTER — Encounter: Payer: Self-pay | Admitting: Podiatry

## 2017-05-14 ENCOUNTER — Ambulatory Visit (INDEPENDENT_AMBULATORY_CARE_PROVIDER_SITE_OTHER): Payer: Managed Care, Other (non HMO)

## 2017-05-14 DIAGNOSIS — S99921A Unspecified injury of right foot, initial encounter: Secondary | ICD-10-CM

## 2017-05-14 DIAGNOSIS — M79671 Pain in right foot: Secondary | ICD-10-CM

## 2017-05-14 NOTE — Progress Notes (Signed)
Subjective:   Patient ID: Tammy Weeks, female   DOB: 62 y.o.   MRN: [id number]   HPI Patient presents stating that she stubbed her fourth toe right and it is been very sore and she is worried that she may have broken the toe.  Patient states this occurred about a week ago and she is been active but it is been tender   ROS      Objective:  Physical Exam  Neurovascular status intact with swelling of the fourth digit right proximal phalanx secondary to injury sustained     Assessment:  Foot pain with possibility for fracture fourth digit right     Plan:  H&P x-rays reviewed with patient and at this time I recommended wider shoes ice therapy and that should heal uneventfully over the next couple months  X-ray was negative for signs of fracture appears to be soft tissue contusion

## 2017-09-18 ENCOUNTER — Encounter: Payer: Managed Care, Other (non HMO) | Admitting: Sports Medicine

## 2017-09-24 ENCOUNTER — Encounter: Payer: Self-pay | Admitting: Sports Medicine

## 2017-09-24 ENCOUNTER — Ambulatory Visit: Payer: Managed Care, Other (non HMO) | Admitting: Sports Medicine

## 2017-09-24 DIAGNOSIS — M1711 Unilateral primary osteoarthritis, right knee: Secondary | ICD-10-CM

## 2017-09-24 NOTE — Progress Notes (Signed)
    Patient was fitted for a : standard, cushioned, semi-rigid orthotic. The orthotic was heated and afterward the patient stood on the orthotic blank positioned on the orthotic stand. The patient was positioned in subtalar neutral position and 10 degrees of ankle dorsiflexion in a weight bearing stance. After completion of molding, a stable base was applied to the orthotic blank. The blank was ground to a stable position for weight bearing. Size: 7 Base: White EVA Additional Posting and Padding: None The patient ambulated these, and they were very comfortable.  I spent 40 minutes with this patient, greater than 50% was face-to-face time counseling regarding the below diagnosis.  ___________________________________________ [name], M.D., ABFM., CAQSM. Primary Care and Sports Medicine [facility]  Adjunct Instructor of Family Medicine  [employer]   

## 2017-09-24 NOTE — Assessment & Plan Note (Signed)
Fantastic response to Orthovisc 2 years ago, started to have a twinge of pain at the medial joint line, submitting for reapproval, hopefully we can start tomorrow.  I am happy to work her in.

## 2017-09-28 ENCOUNTER — Telehealth: Payer: Self-pay | Admitting: Sports Medicine

## 2017-09-28 NOTE — Telephone Encounter (Signed)
-----   Message from Tasia Catchings, [place] sent at 09/28/2017  4:43 PM EDT ----- Information has been submitted to Orthovisc and awaiting determination.  ----- Message ----- From: Silverio Decamp, MD Sent: 09/24/2017   2:34 PM EDT To: Narda Rutherford, CMA, Tasia Catchings, CMA  Good response to Orthovisc 2 years ago, we need to start again, right knee only. X-ray confirmed, I can work her in tomorrow, double book if we get this approved. ___________________________________________ Gwen Her. Dianah Field, M.D., ABFM., CAQSM. Primary Care and [place] Instructor of [place] [employer] [facility] of Medicine

## 2017-09-30 NOTE — Telephone Encounter (Signed)
Patient is agreeable to start injections. appointments have been scheduled.

## 2017-09-30 NOTE — Telephone Encounter (Signed)
Orthovisc is covered. There is only a copay for the office visit. If collected at the time of service, the copay will be $40.00. Specialist Office Visit Copay applies to each successive visit. The deductible does not apply. Once the out of pocket is met, the patient will have no financial responsibility. Call reference number is [id number]. Please submit the prescription form from the [facility] portal or fax a prescription to [phone] if you would like [place] to triage to the pharmacy.   Left message for patient to call back.

## 2017-11-09 ENCOUNTER — Ambulatory Visit: Payer: Managed Care, Other (non HMO) | Admitting: Sports Medicine

## 2017-11-16 ENCOUNTER — Ambulatory Visit: Payer: Managed Care, Other (non HMO) | Admitting: Sports Medicine

## 2017-11-16 DIAGNOSIS — M1711 Unilateral primary osteoarthritis, right knee: Secondary | ICD-10-CM

## 2017-11-16 NOTE — Assessment & Plan Note (Signed)
Fantastic response to Orthovisc 2 years ago. We are starting Orthovisc today, return in 1 week for Orthovisc No. 2 of 4 right knee

## 2017-11-16 NOTE — Progress Notes (Signed)

## 2017-11-23 ENCOUNTER — Ambulatory Visit: Payer: Managed Care, Other (non HMO) | Admitting: Sports Medicine

## 2017-11-23 DIAGNOSIS — M1711 Unilateral primary osteoarthritis, right knee: Secondary | ICD-10-CM

## 2017-11-23 NOTE — Progress Notes (Signed)

## 2017-11-23 NOTE — Assessment & Plan Note (Signed)
Orthovisc No. 2 of 4 into the right knee today, return in 1 week for Orthovisc No. 3 of 4 into the right knee.

## 2017-11-30 ENCOUNTER — Ambulatory Visit (INDEPENDENT_AMBULATORY_CARE_PROVIDER_SITE_OTHER): Payer: Managed Care, Other (non HMO) | Admitting: Sports Medicine

## 2017-11-30 DIAGNOSIS — M1711 Unilateral primary osteoarthritis, right knee: Secondary | ICD-10-CM | POA: Diagnosis not present

## 2017-11-30 NOTE — Progress Notes (Signed)

## 2017-11-30 NOTE — Assessment & Plan Note (Signed)
Orthovisc No. 3 of 4 into the right knee, return in 1 week for Orthovisc No. 4 of 4 right knee.

## 2017-12-07 ENCOUNTER — Ambulatory Visit: Payer: Managed Care, Other (non HMO) | Admitting: Sports Medicine

## 2017-12-07 DIAGNOSIS — M1711 Unilateral primary osteoarthritis, right knee: Secondary | ICD-10-CM | POA: Diagnosis not present

## 2017-12-07 NOTE — Progress Notes (Signed)

## 2017-12-07 NOTE — Assessment & Plan Note (Signed)
Orthovisc No. 4 of 4 into the right knee, return as needed.

## 2018-04-05 DIAGNOSIS — L820 Inflamed seborrheic keratosis: Secondary | ICD-10-CM | POA: Diagnosis not present

## 2018-04-05 DIAGNOSIS — D485 Neoplasm of uncertain behavior of skin: Secondary | ICD-10-CM | POA: Diagnosis not present

## 2018-04-05 DIAGNOSIS — D224 Melanocytic nevi of scalp and neck: Secondary | ICD-10-CM | POA: Diagnosis not present

## 2018-04-05 DIAGNOSIS — Z85828 Personal history of other malignant neoplasm of skin: Secondary | ICD-10-CM | POA: Diagnosis not present

## 2018-04-07 ENCOUNTER — Encounter: Payer: Self-pay | Admitting: Podiatry

## 2018-04-07 ENCOUNTER — Ambulatory Visit (INDEPENDENT_AMBULATORY_CARE_PROVIDER_SITE_OTHER): Payer: Medicare Other | Admitting: Podiatry

## 2018-04-07 ENCOUNTER — Other Ambulatory Visit: Payer: Self-pay

## 2018-04-07 DIAGNOSIS — L03031 Cellulitis of right toe: Secondary | ICD-10-CM | POA: Diagnosis not present

## 2018-04-07 MED ORDER — NEOMYCIN-POLYMYXIN-HC 3.5-10000-1 OT SOLN: OTIC | 0 refills | Status: DC

## 2018-04-07 NOTE — Progress Notes (Signed)
Subjective:   Patient ID: Tammy Weeks, female   DOB: 63 y.o.   MRN: [id number]   HPI Patient presents with pain in the lateral side of the right big toe with distal redness but no active drainage noted states is been tender and she cannot take care of herself.   ROS      Objective:  Physical Exam  Neurovascular status intact negative Homans sign noted with patient right hallux lateral side showing distal redness and quite a bit of discomfort with damage to the nailbed itself     Assessment:  Localized paronychia infection right hallux lateral border     Plan:  H&P condition reviewed and discussed paronychia versus ingrown toenail deformity.  We reviewed treatment options at this point conservative options been recommended and I infiltrated the right hallux 60 mg like Marcaine mixture sterile prep applied to the toe and using sterile instrumentation I remove the lateral border I removed all proud flesh abscess tissue allow channel for drainage and flush the area.  Applied sterile dressing instructed on soaks and reappoint

## 2018-04-07 NOTE — Patient Instructions (Signed)

## 2018-04-13 ENCOUNTER — Telehealth: Payer: Self-pay | Admitting: *Deleted

## 2018-04-13 NOTE — Telephone Encounter (Signed)
Pt called states had an toenail procedure last week, but not the permanent and wanted to know how to continue to soak the toe.

## 2018-04-13 NOTE — Telephone Encounter (Signed)
I informed pt she should continue the soaks until the area had a dry hard scab without redness, or drainage. Pt states understanding.

## 2018-04-16 DIAGNOSIS — Z01419 Encounter for gynecological examination (general) (routine) without abnormal findings: Secondary | ICD-10-CM | POA: Diagnosis not present

## 2018-04-16 DIAGNOSIS — Z124 Encounter for screening for malignant neoplasm of cervix: Secondary | ICD-10-CM | POA: Diagnosis not present

## 2018-04-16 DIAGNOSIS — Z779 Other contact with and (suspected) exposures hazardous to health: Secondary | ICD-10-CM | POA: Diagnosis not present

## 2018-04-16 DIAGNOSIS — Z6831 Body mass index (BMI) 31.0-31.9, adult: Secondary | ICD-10-CM | POA: Diagnosis not present

## 2018-05-21 ENCOUNTER — Telehealth: Payer: Self-pay | Admitting: Internal Medicine

## 2018-05-21 NOTE — Telephone Encounter (Signed)
Pt aware. Thanks

## 2018-05-21 NOTE — Telephone Encounter (Signed)
Nope.

## 2018-05-21 NOTE — Telephone Encounter (Signed)
Pt called. She's coming in on Monday for an injection to her left knee and is wanting to know if you're going to need her to have xray first? Thanks.

## 2018-05-24 ENCOUNTER — Ambulatory Visit (INDEPENDENT_AMBULATORY_CARE_PROVIDER_SITE_OTHER): Payer: Medicare Other | Admitting: Sports Medicine

## 2018-05-24 ENCOUNTER — Encounter: Payer: Self-pay | Admitting: Sports Medicine

## 2018-05-24 DIAGNOSIS — M1711 Unilateral primary osteoarthritis, right knee: Secondary | ICD-10-CM

## 2018-05-24 DIAGNOSIS — M170 Bilateral primary osteoarthritis of knee: Secondary | ICD-10-CM

## 2018-05-24 NOTE — Assessment & Plan Note (Addendum)
We did Orthovisc in October 2019, right knee continues to do very well. Left knee steroid injection today, getting her approved for Orthovisc should this not be effective. Return in 1 month as needed.

## 2018-05-24 NOTE — Progress Notes (Signed)
Subjective:    CC: Left knee pain  HPI: This is a pleasant 63 year old female, last fall we did Orthovisc in her right knee, this continues to do well.  She is now having pain in her left knee, moderate, persistent, localized at the medial joint line without radiation, no trauma, no mechanical symptoms.  I reviewed the past medical history, family history, social history, surgical history, and allergies today and no changes were needed.  Please see the problem list section below in epic for further details.  Past Medical History: Past Medical History:  Diagnosis Date  . Anxiety   . High cholesterol   . Hypertension    Past Surgical History: Past Surgical History:  Procedure Laterality Date  . ABDOMINAL HYSTERECTOMY     Social History: Social History   Socioeconomic History  . Marital status: Married    Spouse name: Not on file  . Number of children: Not on file  . Years of education: Not on file  . Highest education level: Not on file  Occupational History  . Not on file  Social Needs  . Financial resource strain: Not on file  . Food insecurity:    Worry: Not on file    Inability: Not on file  . Transportation needs:    Medical: Not on file    Non-medical: Not on file  Tobacco Use  . Smoking status: Former Research scientist (life sciences)  . Smokeless tobacco: Never Used  Substance and Sexual Activity  . Alcohol use: Yes    Comment: wine  . Drug use: No  . Sexual activity: Not on file  Lifestyle  . Physical activity:    Days per week: Not on file    Minutes per session: Not on file  . Stress: Not on file  Relationships  . Social connections:    Talks on phone: Not on file    Gets together: Not on file    Attends religious service: Not on file    Active member of club or organization: Not on file    Attends meetings of clubs or organizations: Not on file    Relationship status: Not on file  Other Topics Concern  . Not on file  Social History Narrative  . Not on file   Family  History: No family history on file. Allergies: Allergies  Allergen Reactions  . Codeine Rash  . Sulfa Antibiotics Rash   Medications: See med rec.  Review of Systems: No fevers, chills, night sweats, weight loss, chest pain, or shortness of breath.   Objective:    General: Well Developed, well nourished, and in no acute distress.  Neuro: Alert and oriented x3, extra-ocular muscles intact, sensation grossly intact.  HEENT: Normocephalic, atraumatic, pupils equal round reactive to light, neck supple, no masses, no lymphadenopathy, thyroid nonpalpable.  Skin: Warm and dry, no rashes. Cardiac: Regular rate and rhythm, no murmurs rubs or gallops, no lower extremity edema.  Respiratory: Clear to auscultation bilaterally. Not using accessory muscles, speaking in full sentences. Left knee: Normal to inspection with no erythema or effusion or obvious bony abnormalities. Tender to palpation of the medial joint line ROM normal in flexion and extension and lower leg rotation. Ligaments with solid consistent endpoints including ACL, PCL, LCL, MCL. Negative Mcmurray's and provocative meniscal tests. Non painful patellar compression. Patellar and quadriceps tendons unremarkable. Hamstring and quadriceps strength is normal.  Procedure: Real-time Ultrasound Guided injection of the left knee  device: GE Logiq E  Verbal informed consent obtained.  Time-out conducted.  Noted no overlying erythema, induration, or other signs of local infection.  Skin prepped in a sterile fashion.  Local anesthesia: Topical Ethyl chloride.  With sterile technique and under real time ultrasound guidance:  1 cc Kenalog 40, 2 cc lidocaine, 2 cc bupivacaine injected easily Completed without difficulty  Pain immediately resolved suggesting accurate placement of the medication.  Advised to call if fevers/chills, erythema, induration, drainage, or persistent bleeding.  Images permanently stored and available for review  in the ultrasound unit.  Impression: Technically successful ultrasound guided injection.  Impression and Recommendations:    Primary osteoarthritis of both knees We did Orthovisc in October 2019, right knee continues to do very well. Left knee steroid injection today, getting her approved for Orthovisc should this not be effective. Return in 1 month as needed.   ___________________________________________ Gwen Her. Dianah Field, M.D., ABFM., CAQSM. Primary Care and Sports Medicine [facility]  Adjunct Professor of [place] of [facility] of Medicine

## 2018-05-25 DIAGNOSIS — H903 Sensorineural hearing loss, bilateral: Secondary | ICD-10-CM | POA: Diagnosis not present

## 2018-05-31 ENCOUNTER — Telehealth: Payer: Self-pay | Admitting: Sports Medicine

## 2018-05-31 NOTE — Telephone Encounter (Signed)
Information has been submitted to Orthovisc and awaiting determination.   

## 2018-05-31 NOTE — Telephone Encounter (Signed)
-----   Message from Silverio Decamp, MD sent at 05/24/2018  9:33 AM EDT ----- Orthovisc approval, left knee. XR confirmed. Failed steroid. ___________________________________________Thomas J. Dianah Field, M.D., ABFM., CAQSM.Primary Care and Sports MedicineC[facility] MedCenter KernersvilleAdjunct Professor of [place] of [facility] of Medicine

## 2018-06-04 NOTE — Telephone Encounter (Signed)
Orthovisc is covered per Medicare guidelines. The plan type is Medicare Part A&B. There is no copay. The deductible has been met and the patient's responsibility is 20% of the allowable amount. The out of pocket does not apply. Coverage is based on the provider ensuring the patient meets medical necessity for this therapy. Call reference number is Portal.  Called patient and VM is not set up.

## 2018-06-08 NOTE — Telephone Encounter (Signed)
Left brief VM for patient with estimae for the injections. Patient was asked to call back if she would like to start the injections.

## 2018-06-21 DIAGNOSIS — Z1231 Encounter for screening mammogram for malignant neoplasm of breast: Secondary | ICD-10-CM | POA: Diagnosis not present

## 2018-07-05 DIAGNOSIS — D224 Melanocytic nevi of scalp and neck: Secondary | ICD-10-CM | POA: Diagnosis not present

## 2018-07-05 DIAGNOSIS — D2372 Other benign neoplasm of skin of left lower limb, including hip: Secondary | ICD-10-CM | POA: Diagnosis not present

## 2018-07-05 DIAGNOSIS — D225 Melanocytic nevi of trunk: Secondary | ICD-10-CM | POA: Diagnosis not present

## 2018-07-05 DIAGNOSIS — B079 Viral wart, unspecified: Secondary | ICD-10-CM | POA: Diagnosis not present

## 2018-07-05 DIAGNOSIS — Z85828 Personal history of other malignant neoplasm of skin: Secondary | ICD-10-CM | POA: Diagnosis not present

## 2018-07-05 DIAGNOSIS — D485 Neoplasm of uncertain behavior of skin: Secondary | ICD-10-CM | POA: Diagnosis not present

## 2018-07-05 DIAGNOSIS — L570 Actinic keratosis: Secondary | ICD-10-CM | POA: Diagnosis not present

## 2018-07-05 DIAGNOSIS — L814 Other melanin hyperpigmentation: Secondary | ICD-10-CM | POA: Diagnosis not present

## 2018-07-05 DIAGNOSIS — L821 Other seborrheic keratosis: Secondary | ICD-10-CM | POA: Diagnosis not present

## 2018-07-23 DIAGNOSIS — H43813 Vitreous degeneration, bilateral: Secondary | ICD-10-CM | POA: Diagnosis not present

## 2018-07-23 DIAGNOSIS — H52203 Unspecified astigmatism, bilateral: Secondary | ICD-10-CM | POA: Diagnosis not present

## 2018-07-23 DIAGNOSIS — H5203 Hypermetropia, bilateral: Secondary | ICD-10-CM | POA: Diagnosis not present

## 2018-07-23 DIAGNOSIS — H25013 Cortical age-related cataract, bilateral: Secondary | ICD-10-CM | POA: Diagnosis not present

## 2018-07-27 DIAGNOSIS — R7309 Other abnormal glucose: Secondary | ICD-10-CM | POA: Diagnosis not present

## 2018-07-27 DIAGNOSIS — K219 Gastro-esophageal reflux disease without esophagitis: Secondary | ICD-10-CM | POA: Diagnosis not present

## 2018-07-27 DIAGNOSIS — E782 Mixed hyperlipidemia: Secondary | ICD-10-CM | POA: Diagnosis not present

## 2018-07-27 DIAGNOSIS — Z23 Encounter for immunization: Secondary | ICD-10-CM | POA: Diagnosis not present

## 2018-07-27 DIAGNOSIS — D3500 Benign neoplasm of unspecified adrenal gland: Secondary | ICD-10-CM | POA: Diagnosis not present

## 2018-07-27 DIAGNOSIS — F419 Anxiety disorder, unspecified: Secondary | ICD-10-CM | POA: Diagnosis not present

## 2018-07-27 DIAGNOSIS — I10 Essential (primary) hypertension: Secondary | ICD-10-CM | POA: Diagnosis not present

## 2018-08-19 DIAGNOSIS — L988 Other specified disorders of the skin and subcutaneous tissue: Secondary | ICD-10-CM | POA: Insufficient documentation

## 2018-08-30 ENCOUNTER — Other Ambulatory Visit: Payer: Self-pay

## 2018-08-30 DIAGNOSIS — Z20822 Contact with and (suspected) exposure to covid-19: Secondary | ICD-10-CM

## 2018-09-01 LAB — SPECIMEN STATUS REPORT

## 2018-09-01 LAB — NOVEL CORONAVIRUS, NAA: SARS-CoV-2, NAA: NOT DETECTED

## 2018-09-16 ENCOUNTER — Other Ambulatory Visit: Payer: Self-pay

## 2018-09-16 DIAGNOSIS — R6889 Other general symptoms and signs: Secondary | ICD-10-CM | POA: Diagnosis not present

## 2018-09-16 DIAGNOSIS — Z20822 Contact with and (suspected) exposure to covid-19: Secondary | ICD-10-CM

## 2018-09-17 LAB — NOVEL CORONAVIRUS, NAA: SARS-CoV-2, NAA: NOT DETECTED

## 2018-10-12 DIAGNOSIS — Z23 Encounter for immunization: Secondary | ICD-10-CM | POA: Diagnosis not present

## 2018-10-29 DIAGNOSIS — Z85828 Personal history of other malignant neoplasm of skin: Secondary | ICD-10-CM | POA: Diagnosis not present

## 2018-10-29 DIAGNOSIS — C44519 Basal cell carcinoma of skin of other part of trunk: Secondary | ICD-10-CM | POA: Diagnosis not present

## 2018-10-29 DIAGNOSIS — L738 Other specified follicular disorders: Secondary | ICD-10-CM | POA: Diagnosis not present

## 2018-10-29 DIAGNOSIS — L820 Inflamed seborrheic keratosis: Secondary | ICD-10-CM | POA: Diagnosis not present

## 2018-10-29 DIAGNOSIS — L720 Epidermal cyst: Secondary | ICD-10-CM | POA: Diagnosis not present

## 2018-10-29 DIAGNOSIS — L570 Actinic keratosis: Secondary | ICD-10-CM | POA: Diagnosis not present

## 2018-10-29 DIAGNOSIS — D1801 Hemangioma of skin and subcutaneous tissue: Secondary | ICD-10-CM | POA: Diagnosis not present

## 2018-11-26 ENCOUNTER — Other Ambulatory Visit: Payer: Self-pay

## 2018-11-26 ENCOUNTER — Ambulatory Visit (INDEPENDENT_AMBULATORY_CARE_PROVIDER_SITE_OTHER): Payer: Medicare Other | Admitting: Sports Medicine

## 2018-11-26 DIAGNOSIS — M170 Bilateral primary osteoarthritis of knee: Secondary | ICD-10-CM

## 2018-11-26 NOTE — Assessment & Plan Note (Signed)
Steroid in the left knee, Orthovisc in the right knee. Return in 1 week for Orthovisc No. 2 of 4 into the right knee.

## 2018-11-26 NOTE — Progress Notes (Signed)
Subjective:    CC: Bilateral knee pain  HPI: Tammy Weeks returns, she is a pleasant 63 year old female, she has bilateral knee osteoarthritis, we last finished Orthovisc October 2019 and she did extremely well.  She is now having recurrence of pain, bilateral, she desires to start Orthovisc in the right knee and just do a steroid injection in the left, pain is moderate, persistent.  I reviewed the past medical history, family history, social history, surgical history, and allergies today and no changes were needed.  Please see the problem list section below in epic for further details.  Past Medical History: Past Medical History:  Diagnosis Date  . Anxiety   . High cholesterol   . Hypertension    Past Surgical History: Past Surgical History:  Procedure Laterality Date  . ABDOMINAL HYSTERECTOMY     Social History: Social History   Socioeconomic History  . Marital status: Married    Spouse name: Not on file  . Number of children: Not on file  . Years of education: Not on file  . Highest education level: Not on file  Occupational History  . Not on file  Social Needs  . Financial resource strain: Not on file  . Food insecurity    Worry: Not on file    Inability: Not on file  . Transportation needs    Medical: Not on file    Non-medical: Not on file  Tobacco Use  . Smoking status: Former Research scientist (life sciences)  . Smokeless tobacco: Never Used  Substance and Sexual Activity  . Alcohol use: Yes    Comment: wine  . Drug use: No  . Sexual activity: Not on file  Lifestyle  . Physical activity    Days per week: Not on file    Minutes per session: Not on file  . Stress: Not on file  Relationships  . Social Herbalist on phone: Not on file    Gets together: Not on file    Attends religious service: Not on file    Active member of club or organization: Not on file    Attends meetings of clubs or organizations: Not on file    Relationship status: Not on file  Other Topics  Concern  . Not on file  Social History Narrative  . Not on file   Family History: No family history on file. Allergies: Allergies  Allergen Reactions  . Codeine Rash  . Sulfa Antibiotics Rash   Medications: See med rec.  Review of Systems: No fevers, chills, night sweats, weight loss, chest pain, or shortness of breath.   Objective:    General: Well Developed, well nourished, and in no acute distress.  Neuro: Alert and oriented x3, extra-ocular muscles intact, sensation grossly intact.  HEENT: Normocephalic, atraumatic, pupils equal round reactive to light, neck supple, no masses, no lymphadenopathy, thyroid nonpalpable.  Skin: Warm and dry, no rashes. Cardiac: Regular rate and rhythm, no murmurs rubs or gallops, no lower extremity edema.  Respiratory: Clear to auscultation bilaterally. Not using accessory muscles, speaking in full sentences. Bilateral knees: Normal to inspection with no erythema or effusion or obvious bony abnormalities. Tenderness at the joint lines bilaterally. ROM normal in flexion and extension and lower leg rotation. Ligaments with solid consistent endpoints including ACL, PCL, LCL, MCL. Negative Mcmurray's and provocative meniscal tests. Non painful patellar compression. Patellar and quadriceps tendons unremarkable. Hamstring and quadriceps strength is normal.  Procedure: Real-time Ultrasound Guided injection of the left knee Device: Samsung HS60  Verbal  informed consent obtained.  Time-out conducted.  Noted no overlying erythema, induration, or other signs of local infection.  Skin prepped in a sterile fashion.  Local anesthesia: Topical Ethyl chloride.  With sterile technique and under real time ultrasound guidance: 1 cc Kenalog 40, 2 cc lidocaine, 2 cc bupivacaine injected easily Completed without difficulty  Pain immediately resolved suggesting accurate placement of the medication.  Advised to call if fevers/chills, erythema, induration,  drainage, or persistent bleeding.  Images permanently stored and available for review in the ultrasound unit.  Impression: Technically successful ultrasound guided injection.  Procedure: Real-time Ultrasound Guided injection of the right knee Device: Samsung HS60  Verbal informed consent obtained.  Time-out conducted.  Noted no overlying erythema, induration, or other signs of local infection.  Skin prepped in a sterile fashion.  Local anesthesia: Topical Ethyl chloride.  With sterile technique and under real time ultrasound guidance:  30 mg/2 mL of OrthoVisc (sodium hyaluronate) in a prefilled syringe was injected easily into the knee through a 22-gauge needle. Completed without difficulty  Pain immediately resolved suggesting accurate placement of the medication.  Advised to call if fevers/chills, erythema, induration, drainage, or persistent bleeding.  Images permanently stored and available for review in the ultrasound unit.  Impression: Technically successful ultrasound guided injection.  Impression and Recommendations:    Primary osteoarthritis of both knees Steroid in the left knee, Orthovisc in the right knee. Return in 1 week for Orthovisc No. 2 of 4 into the right knee.   ___________________________________________ Gwen Her. Dianah Field, M.D., ABFM., CAQSM. Primary Care and Sports Medicine [facility] [facility]  Adjunct Professor of [place] of [facility] of Medicine

## 2018-12-03 ENCOUNTER — Other Ambulatory Visit: Payer: Self-pay

## 2018-12-03 ENCOUNTER — Ambulatory Visit (INDEPENDENT_AMBULATORY_CARE_PROVIDER_SITE_OTHER): Payer: Medicare Other | Admitting: Sports Medicine

## 2018-12-03 DIAGNOSIS — M170 Bilateral primary osteoarthritis of knee: Secondary | ICD-10-CM | POA: Diagnosis not present

## 2018-12-03 NOTE — Progress Notes (Signed)
   Procedure: Real-time Ultrasound Guided injection of the right knee Device: Samsung HS60  Verbal informed consent obtained.  Time-out conducted.  Noted no overlying erythema, induration, or other signs of local infection.  Skin prepped in a sterile fashion.  Local anesthesia: Topical Ethyl chloride.  With sterile technique and under real time ultrasound guidance:  30 mg/2 mL of OrthoVisc (sodium hyaluronate) in a prefilled syringe was injected easily into the knee through a 22-gauge needle. Completed without difficulty  Pain immediately resolved suggesting accurate placement of the medication.  Advised to call if fevers/chills, erythema, induration, drainage, or persistent bleeding.  Images permanently stored and available for review in the ultrasound unit.  Impression: Technically successful ultrasound guided injection.

## 2018-12-03 NOTE — Assessment & Plan Note (Signed)
Left knee continues to do well after steroid injection at the last visit, Orthovisc No. 2 of 4 into the right knee today, return in 1 week for #3 of 4.

## 2018-12-08 ENCOUNTER — Ambulatory Visit: Payer: Medicare Other | Admitting: Sports Medicine

## 2018-12-13 ENCOUNTER — Other Ambulatory Visit: Payer: Self-pay

## 2018-12-13 ENCOUNTER — Ambulatory Visit (INDEPENDENT_AMBULATORY_CARE_PROVIDER_SITE_OTHER): Payer: Medicare Other | Admitting: Sports Medicine

## 2018-12-13 DIAGNOSIS — M170 Bilateral primary osteoarthritis of knee: Secondary | ICD-10-CM

## 2018-12-13 NOTE — Progress Notes (Signed)
   Procedure: Real-time Ultrasound Guided injection of the right knee Device: Samsung HS60  Verbal informed consent obtained.  Time-out conducted.  Noted no overlying erythema, induration, or other signs of local infection.  Skin prepped in a sterile fashion.  Local anesthesia: Topical Ethyl chloride.  With sterile technique and under real time ultrasound guidance:  30 mg/2 mL of OrthoVisc (sodium hyaluronate) in a prefilled syringe was injected easily into the knee through a 22-gauge needle. Completed without difficulty  Pain immediately resolved suggesting accurate placement of the medication.  Advised to call if fevers/chills, erythema, induration, drainage, or persistent bleeding.  Images permanently stored and available for review in the ultrasound unit.  Impression: Technically successful ultrasound guided injection.

## 2018-12-13 NOTE — Assessment & Plan Note (Signed)
Left knee continues to do well after steroid injection, Orthovisc No. 3 of 4 into the right knee today, return in 1 week for #4 of 4.

## 2018-12-20 ENCOUNTER — Ambulatory Visit (INDEPENDENT_AMBULATORY_CARE_PROVIDER_SITE_OTHER): Payer: Medicare Other | Admitting: Sports Medicine

## 2018-12-20 DIAGNOSIS — M170 Bilateral primary osteoarthritis of knee: Secondary | ICD-10-CM | POA: Diagnosis not present

## 2018-12-20 NOTE — Assessment & Plan Note (Signed)
Orthovisc No. 4 of 4 into the right knee, left knee continues to do well after steroid injection. Return as needed.

## 2018-12-20 NOTE — Progress Notes (Signed)
   Procedure: Real-time Ultrasound Guidedinjection of the right knee Device: Samsung HS60  Verbal informed consent obtained.  Time-out conducted.  Noted no overlying erythema, induration, or other signs of local infection.  Skin prepped in a sterile fashion.  Local anesthesia: Topical Ethyl chloride.  With sterile technique and under real time ultrasound guidance: 30 mg/2 mL of OrthoVisc (sodium hyaluronate) in a prefilled syringe was injected easily into the knee through a 22-gauge needle. Completed without difficulty  Pain immediately resolved suggesting accurate placement of the medication.  Advised to call if fevers/chills, erythema, induration, drainage, or persistent bleeding.  Images permanently stored and available for review in the ultrasound unit.  Impression: Technically successful ultrasound guided injection.  __________________________________________________________________________________  Primary osteoarthritis of both knees Orthovisc No. 4 of 4 into the right knee, left knee continues to do well after steroid injection. Return as needed.

## 2018-12-30 ENCOUNTER — Ambulatory Visit (INDEPENDENT_AMBULATORY_CARE_PROVIDER_SITE_OTHER): Payer: Medicare Other | Admitting: Podiatry

## 2018-12-30 ENCOUNTER — Other Ambulatory Visit: Payer: Self-pay

## 2018-12-30 ENCOUNTER — Encounter: Payer: Self-pay | Admitting: Podiatry

## 2018-12-30 VITALS — Temp 97.4°F

## 2018-12-30 DIAGNOSIS — L600 Ingrowing nail: Secondary | ICD-10-CM

## 2018-12-30 MED ORDER — NEOMYCIN-POLYMYXIN-HC 3.5-10000-1 OT SOLN: OTIC | 0 refills | Status: DC

## 2018-12-30 NOTE — Patient Instructions (Signed)

## 2018-12-31 ENCOUNTER — Telehealth: Payer: Self-pay | Admitting: *Deleted

## 2018-12-31 ENCOUNTER — Other Ambulatory Visit: Payer: Self-pay | Admitting: Podiatry

## 2018-12-31 NOTE — Telephone Encounter (Signed)
Left message informing pt that she could begin exercising depending on how her toe felt, but to keep in mind that may increase the bleeding, swelling and discomfort to the area, may want to just perform exercises that do not require pressing with or pressure to the feet, upper body would be fine.

## 2018-12-31 NOTE — Telephone Encounter (Signed)
Pt called states she had an ingrown toenail procedure yesterday and wanted to know when she can begin to exercise.

## 2019-01-03 NOTE — Progress Notes (Signed)
Subjective:   Patient ID: Tammy Weeks, female   DOB: 63 y.o.   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  Patient wants surgery and today I infiltrated the right hallux 60 mg like Marcaine mixture sterile prep applied to the toe and using sterile instrumentation I remove the lateral border exposed matrix and applied phenol 3 applications 30 seconds followed by alcohol lavage and sterile dressing.  Give instructions on soaks and reappoint

## 2019-01-04 ENCOUNTER — Ambulatory Visit: Payer: Medicare Other | Attending: Internal Medicine

## 2019-01-04 DIAGNOSIS — Z20822 Contact with and (suspected) exposure to covid-19: Secondary | ICD-10-CM

## 2019-01-06 LAB — NOVEL CORONAVIRUS, NAA: SARS-CoV-2, NAA: NOT DETECTED

## 2019-01-19 ENCOUNTER — Telehealth: Payer: Self-pay | Admitting: *Deleted

## 2019-01-19 NOTE — Telephone Encounter (Signed)
I left him message

## 2019-01-19 NOTE — Telephone Encounter (Signed)
Pt's husband, Dr. Lindaann Pascal called to speak to Dr. Paulla Dolly.

## 2019-01-25 ENCOUNTER — Ambulatory Visit: Payer: Medicare Other | Attending: Internal Medicine

## 2019-01-25 DIAGNOSIS — Z20822 Contact with and (suspected) exposure to covid-19: Secondary | ICD-10-CM

## 2019-01-25 DIAGNOSIS — I10 Essential (primary) hypertension: Secondary | ICD-10-CM | POA: Diagnosis not present

## 2019-01-25 DIAGNOSIS — E782 Mixed hyperlipidemia: Secondary | ICD-10-CM | POA: Diagnosis not present

## 2019-01-25 DIAGNOSIS — R7309 Other abnormal glucose: Secondary | ICD-10-CM | POA: Diagnosis not present

## 2019-01-27 LAB — NOVEL CORONAVIRUS, NAA: SARS-CoV-2, NAA: NOT DETECTED

## 2019-02-10 ENCOUNTER — Ambulatory Visit: Payer: Medicare Other

## 2019-02-18 ENCOUNTER — Ambulatory Visit: Payer: Medicare Other

## 2019-02-21 ENCOUNTER — Ambulatory Visit: Payer: Medicare Other

## 2019-02-23 DIAGNOSIS — Z85828 Personal history of other malignant neoplasm of skin: Secondary | ICD-10-CM | POA: Diagnosis not present

## 2019-02-23 DIAGNOSIS — B078 Other viral warts: Secondary | ICD-10-CM | POA: Diagnosis not present

## 2019-02-23 DIAGNOSIS — D485 Neoplasm of uncertain behavior of skin: Secondary | ICD-10-CM | POA: Diagnosis not present

## 2019-03-03 ENCOUNTER — Ambulatory Visit: Payer: Medicare Other

## 2019-03-11 ENCOUNTER — Ambulatory Visit: Payer: Medicare Other

## 2019-03-17 DIAGNOSIS — F419 Anxiety disorder, unspecified: Secondary | ICD-10-CM | POA: Diagnosis not present

## 2019-03-17 DIAGNOSIS — Z1389 Encounter for screening for other disorder: Secondary | ICD-10-CM | POA: Diagnosis not present

## 2019-03-17 DIAGNOSIS — Z0000 Encounter for general adult medical examination without abnormal findings: Secondary | ICD-10-CM | POA: Diagnosis not present

## 2019-03-17 DIAGNOSIS — E782 Mixed hyperlipidemia: Secondary | ICD-10-CM | POA: Diagnosis not present

## 2019-03-17 DIAGNOSIS — I10 Essential (primary) hypertension: Secondary | ICD-10-CM | POA: Diagnosis not present

## 2019-04-28 DIAGNOSIS — Z6829 Body mass index (BMI) 29.0-29.9, adult: Secondary | ICD-10-CM | POA: Diagnosis not present

## 2019-04-28 DIAGNOSIS — Z01419 Encounter for gynecological examination (general) (routine) without abnormal findings: Secondary | ICD-10-CM | POA: Diagnosis not present

## 2019-04-28 DIAGNOSIS — Z779 Other contact with and (suspected) exposures hazardous to health: Secondary | ICD-10-CM | POA: Diagnosis not present

## 2019-04-28 DIAGNOSIS — Z124 Encounter for screening for malignant neoplasm of cervix: Secondary | ICD-10-CM | POA: Diagnosis not present

## 2019-05-05 DIAGNOSIS — R928 Other abnormal and inconclusive findings on diagnostic imaging of breast: Secondary | ICD-10-CM | POA: Diagnosis not present

## 2019-05-16 DIAGNOSIS — Z03818 Encounter for observation for suspected exposure to other biological agents ruled out: Secondary | ICD-10-CM | POA: Diagnosis not present

## 2019-05-16 DIAGNOSIS — Z20828 Contact with and (suspected) exposure to other viral communicable diseases: Secondary | ICD-10-CM | POA: Diagnosis not present

## 2019-06-30 ENCOUNTER — Ambulatory Visit (INDEPENDENT_AMBULATORY_CARE_PROVIDER_SITE_OTHER): Payer: Medicare Other | Admitting: Podiatry

## 2019-06-30 ENCOUNTER — Encounter: Payer: Self-pay | Admitting: Podiatry

## 2019-06-30 ENCOUNTER — Ambulatory Visit (INDEPENDENT_AMBULATORY_CARE_PROVIDER_SITE_OTHER): Payer: Medicare Other

## 2019-06-30 ENCOUNTER — Other Ambulatory Visit: Payer: Self-pay

## 2019-06-30 DIAGNOSIS — D3610 Benign neoplasm of peripheral nerves and autonomic nervous system, unspecified: Secondary | ICD-10-CM

## 2019-06-30 DIAGNOSIS — M7752 Other enthesopathy of left foot: Secondary | ICD-10-CM

## 2019-06-30 NOTE — Progress Notes (Signed)
Subjective:   Patient ID: Tammy Weeks, female   DOB: 64 y.o.   MRN: [id number]   HPI Patient states she is very active and she is feeling pain in her left forefoot and feels like it is between the third and fourth toe.  States that it bothers her on the inside of the toe and she does not remember injury and has affected her walking to a degree   ROS      Objective:  Physical Exam  Vascular status intact with pain which seems to be more in the third interspace left with positive Biagio Borg sign with no current involvement of the metatarsophalangeal joints     Assessment:  Neuroma symptomatology left with possibility for capsulitis     Plan:  H&P x-ray reviewed both conditions discussed edema treated Morton's neuroma and I did go ahead did sterile forefoot prep and injected the intermetatarsal space 3 mg Dexasone Kenalog 5 mg Xylocaine advised on wider shoes and reappoint to recheck  X-rays indicate that there is no indication to stress fracture or arthritis associated with this condition

## 2019-07-21 ENCOUNTER — Other Ambulatory Visit: Payer: Self-pay

## 2019-07-21 ENCOUNTER — Ambulatory Visit (INDEPENDENT_AMBULATORY_CARE_PROVIDER_SITE_OTHER): Payer: Medicare Other | Admitting: Podiatry

## 2019-07-21 ENCOUNTER — Encounter: Payer: Self-pay | Admitting: Podiatry

## 2019-07-21 VITALS — Temp 98.0°F

## 2019-07-21 DIAGNOSIS — M7752 Other enthesopathy of left foot: Secondary | ICD-10-CM

## 2019-07-21 DIAGNOSIS — B009 Herpesviral infection, unspecified: Secondary | ICD-10-CM | POA: Insufficient documentation

## 2019-07-21 DIAGNOSIS — D3610 Benign neoplasm of peripheral nerves and autonomic nervous system, unspecified: Secondary | ICD-10-CM

## 2019-07-21 NOTE — Progress Notes (Signed)
Subjective:   Patient ID: Tammy Weeks, female   DOB: 64 y.o.   MRN: [id number]   HPI Patient presents stating that she is getting problems with barefoot and thinner type shoes and had temporary relief from previous treatment but the symptoms have been returning   ROS      Objective:  Physical Exam  Neurovascular status intact with pain which is really more around the fourth MPJ left with fluid buildup around the joint and nerve it does not appear to be currently involved     Assessment:  Probability for inflammatory capsulitis fourth MPJ left     Plan:  H&P reviewed condition sterile anesthetic to the area was accomplished and I went ahead and I aspirated the joint getting a small amount of clear fluid injected quarter cc dexamethasone Kenalog applied thick plantar pad and advised on shoe gear modification.  Reappoint as symptoms indicate

## 2019-08-04 DIAGNOSIS — R928 Other abnormal and inconclusive findings on diagnostic imaging of breast: Secondary | ICD-10-CM | POA: Diagnosis not present

## 2019-08-05 DIAGNOSIS — L739 Follicular disorder, unspecified: Secondary | ICD-10-CM | POA: Diagnosis not present

## 2019-08-05 DIAGNOSIS — L929 Granulomatous disorder of the skin and subcutaneous tissue, unspecified: Secondary | ICD-10-CM | POA: Diagnosis not present

## 2019-08-05 DIAGNOSIS — D485 Neoplasm of uncertain behavior of skin: Secondary | ICD-10-CM | POA: Diagnosis not present

## 2019-08-05 DIAGNOSIS — L821 Other seborrheic keratosis: Secondary | ICD-10-CM | POA: Diagnosis not present

## 2019-08-05 DIAGNOSIS — Z85828 Personal history of other malignant neoplasm of skin: Secondary | ICD-10-CM | POA: Diagnosis not present

## 2019-08-22 DIAGNOSIS — Z20828 Contact with and (suspected) exposure to other viral communicable diseases: Secondary | ICD-10-CM | POA: Diagnosis not present

## 2019-08-23 ENCOUNTER — Other Ambulatory Visit: Payer: Self-pay

## 2019-08-23 ENCOUNTER — Other Ambulatory Visit: Payer: Medicare Other

## 2019-08-23 DIAGNOSIS — Z20822 Contact with and (suspected) exposure to covid-19: Secondary | ICD-10-CM

## 2019-08-24 LAB — SARS-COV-2, NAA 2 DAY TAT

## 2019-08-24 LAB — NOVEL CORONAVIRUS, NAA: SARS-CoV-2, NAA: NOT DETECTED

## 2019-09-27 DIAGNOSIS — Z23 Encounter for immunization: Secondary | ICD-10-CM | POA: Diagnosis not present

## 2019-09-29 DIAGNOSIS — L820 Inflamed seborrheic keratosis: Secondary | ICD-10-CM | POA: Diagnosis not present

## 2019-09-29 DIAGNOSIS — Z85828 Personal history of other malignant neoplasm of skin: Secondary | ICD-10-CM | POA: Diagnosis not present

## 2019-09-29 DIAGNOSIS — L570 Actinic keratosis: Secondary | ICD-10-CM | POA: Diagnosis not present

## 2019-11-04 DIAGNOSIS — Z23 Encounter for immunization: Secondary | ICD-10-CM | POA: Diagnosis not present

## 2019-11-11 ENCOUNTER — Other Ambulatory Visit: Payer: Medicare Other

## 2019-11-11 DIAGNOSIS — Z20822 Contact with and (suspected) exposure to covid-19: Secondary | ICD-10-CM

## 2019-11-12 LAB — NOVEL CORONAVIRUS, NAA: SARS-CoV-2, NAA: NOT DETECTED

## 2019-11-12 LAB — SARS-COV-2, NAA 2 DAY TAT

## 2019-11-17 ENCOUNTER — Telehealth: Payer: Self-pay

## 2019-11-17 NOTE — Telephone Encounter (Signed)
Patient would like to do another round of Orthovisc in her right knee.  She would like to get scheduled as soon as possible.  She did have her COVID booster on 11/04/2019 if that matters.

## 2019-11-18 NOTE — Telephone Encounter (Signed)
To Cindy/Amber.  Thank ya'll for working on this.  PS Amber has Autumn Patty scheduled teaching you Orthovisc?  If not I can text him.

## 2019-11-21 DIAGNOSIS — G4700 Insomnia, unspecified: Secondary | ICD-10-CM | POA: Diagnosis not present

## 2019-11-23 ENCOUNTER — Telehealth: Payer: Self-pay | Admitting: Internal Medicine

## 2019-11-23 NOTE — Telephone Encounter (Signed)
Jenny Reichmann, Museum/gallery conservator, what is going on here?  See previous telephone note from a week ago asking to get Orthovisc approved.  Please also call patient and let her know that you are working on it.

## 2019-11-23 NOTE — Telephone Encounter (Signed)
PT called and states that her Rt Knee is really bothering her and wants to know if there is any way that we can go ahead and get her approved for Orthovisc injections as soon as possible. She would also like someone to give her a call back with an update because she states she has been trying since last week to get this. Thank you

## 2019-11-23 NOTE — Telephone Encounter (Signed)
I submitted orthovisc for approval waiting on BID and to see if prior authorization is needed - CF

## 2019-11-29 ENCOUNTER — Telehealth: Payer: Self-pay | Admitting: Sports Medicine

## 2019-11-29 NOTE — Telephone Encounter (Signed)
Thank you for letting her know they are working on a backlog and that her case will be expedited.

## 2019-11-29 NOTE — Telephone Encounter (Signed)
Patient is upset because she has been trying for 3 weeks to get her OrthoVisc Injection and Jenny Reichmann showed me where she is waiting to hear back from [place] people. Patient wants to know IF Dr T can check into this and advise her on what to do at this point. She is wondering if she needs to go to another office to be able to get this done or If Dr T needs to get involved. Thank you

## 2019-11-29 NOTE — Telephone Encounter (Signed)
Patient was updated until today she knew all the information I knew. I called Orthovisc and spoke with stephanie she stated the case has been assigned but they are dealing with a back log of cases right now and she is going to send a message to have this one expedited since the patient is calling the office. I am calling Tammy Weeks back now to let her know what I found out. - CF

## 2019-11-29 NOTE — Telephone Encounter (Signed)
If she is going to threaten going to another office maybe she should go to another office.  Otherwise I think Tammy Weeks should call her to give her the update.

## 2019-12-01 ENCOUNTER — Other Ambulatory Visit: Payer: Self-pay | Admitting: Internal Medicine

## 2019-12-01 DIAGNOSIS — R911 Solitary pulmonary nodule: Secondary | ICD-10-CM

## 2019-12-12 NOTE — Telephone Encounter (Signed)
Tammy Weeks called again today to check on Orthovisc I checked the system and they still have not done anything new wither her claim. I called Orthovisc again and spoke with Colletta [place] she stated she was going to send to her higher ups that the case has been sitting way too long and she was going to get it expedited. - CF

## 2019-12-13 NOTE — Telephone Encounter (Signed)
Dr T   Patient is approved for Orthovisc injections through [place] her first appointment is scheduled on 12/10 - CF

## 2019-12-13 NOTE — Telephone Encounter (Signed)
Excellent, thank you for knocking this out, I am sure the patient is relieved as well.

## 2019-12-15 ENCOUNTER — Other Ambulatory Visit: Payer: Medicare Other

## 2019-12-16 ENCOUNTER — Other Ambulatory Visit: Payer: Self-pay

## 2019-12-16 ENCOUNTER — Ambulatory Visit (INDEPENDENT_AMBULATORY_CARE_PROVIDER_SITE_OTHER): Payer: Medicare Other | Admitting: Sports Medicine

## 2019-12-16 ENCOUNTER — Ambulatory Visit
Admission: RE | Admit: 2019-12-16 | Discharge: 2019-12-16 | Disposition: A | Discharge status: Home / Self Care | Payer: Medicare Other | Source: Ambulatory Visit | Attending: Internal Medicine | Admitting: Internal Medicine

## 2019-12-16 ENCOUNTER — Ambulatory Visit (INDEPENDENT_AMBULATORY_CARE_PROVIDER_SITE_OTHER): Payer: Medicare Other

## 2019-12-16 ENCOUNTER — Ambulatory Visit
Admission: RE | Admit: 2019-12-16 | Discharge: 2019-12-16 | Disposition: A | Discharge status: Home / Self Care | Payer: Self-pay | Source: Ambulatory Visit | Attending: Internal Medicine | Admitting: Internal Medicine

## 2019-12-16 ENCOUNTER — Other Ambulatory Visit: Payer: Self-pay | Admitting: Internal Medicine

## 2019-12-16 DIAGNOSIS — J8410 Pulmonary fibrosis, unspecified: Secondary | ICD-10-CM | POA: Diagnosis not present

## 2019-12-16 DIAGNOSIS — M170 Bilateral primary osteoarthritis of knee: Secondary | ICD-10-CM

## 2019-12-16 DIAGNOSIS — I708 Atherosclerosis of other arteries: Secondary | ICD-10-CM | POA: Diagnosis not present

## 2019-12-16 DIAGNOSIS — R52 Pain, unspecified: Secondary | ICD-10-CM

## 2019-12-16 DIAGNOSIS — I2510 Atherosclerotic heart disease of native coronary artery without angina pectoris: Secondary | ICD-10-CM | POA: Diagnosis not present

## 2019-12-16 DIAGNOSIS — K219 Gastro-esophageal reflux disease without esophagitis: Secondary | ICD-10-CM | POA: Diagnosis not present

## 2019-12-16 DIAGNOSIS — R911 Solitary pulmonary nodule: Secondary | ICD-10-CM

## 2019-12-16 DIAGNOSIS — J984 Other disorders of lung: Secondary | ICD-10-CM | POA: Diagnosis not present

## 2019-12-16 MED ORDER — IOPAMIDOL (ISOVUE-300) INJECTION 61%
75.0000 mL | Freq: Once | INTRAVENOUS | Status: AC | PRN
  Administered 2019-12-16: 75 mL via INTRAVENOUS

## 2019-12-16 NOTE — Assessment & Plan Note (Addendum)
Orthovisc No. 1 of 4 into right knee, return to see me in 1 week for #2 for right knee

## 2019-12-16 NOTE — Progress Notes (Signed)
    Procedures performed today:    Procedure: Real-time Ultrasound Guided injection of the right knee Device: Samsung HS60  Verbal informed consent obtained.  Time-out conducted.  Noted no overlying erythema, induration, or other signs of local infection.  Skin prepped in a sterile fashion.  Local anesthesia: Topical Ethyl chloride.  With sterile technique and under real time ultrasound guidance:  30 mg/2 mL of OrthoVisc (sodium hyaluronate) in a prefilled syringe was injected easily into the knee through a 22-gauge needle.   Completed without difficulty  Advised to call if fevers/chills, erythema, induration, drainage, or persistent bleeding.  Images permanently stored and available for review in PACS.  Impression: Technically successful ultrasound guided injection.  Independent interpretation of notes and tests performed by another provider:   None.  Brief History, Exam, Impression, and Recommendations:    Primary osteoarthritis of both knees Orthovisc No. 1 of 4 into right knee, return to see me in 1 week for #2 for right knee    ___________________________________________ Gwen Her. Dianah Field, M.D., ABFM., CAQSM. Primary Care and [place] Instructor of [place] of [facility] of Medicine

## 2019-12-19 ENCOUNTER — Other Ambulatory Visit: Payer: Medicare Other

## 2019-12-20 ENCOUNTER — Other Ambulatory Visit: Payer: Medicare Other

## 2019-12-22 ENCOUNTER — Other Ambulatory Visit: Payer: Medicare Other

## 2019-12-22 DIAGNOSIS — Z20822 Contact with and (suspected) exposure to covid-19: Secondary | ICD-10-CM

## 2019-12-23 ENCOUNTER — Other Ambulatory Visit: Payer: Self-pay

## 2019-12-23 ENCOUNTER — Ambulatory Visit (INDEPENDENT_AMBULATORY_CARE_PROVIDER_SITE_OTHER): Payer: Medicare Other | Admitting: Sports Medicine

## 2019-12-23 ENCOUNTER — Ambulatory Visit (INDEPENDENT_AMBULATORY_CARE_PROVIDER_SITE_OTHER): Payer: Medicare Other

## 2019-12-23 DIAGNOSIS — M170 Bilateral primary osteoarthritis of knee: Secondary | ICD-10-CM

## 2019-12-23 LAB — SARS-COV-2, NAA 2 DAY TAT

## 2019-12-23 LAB — NOVEL CORONAVIRUS, NAA: SARS-CoV-2, NAA: NOT DETECTED

## 2019-12-23 NOTE — Assessment & Plan Note (Signed)
Orthovisc No. 2 of 4 into the right knee, return in 1 week for #3 of 4.

## 2019-12-23 NOTE — Progress Notes (Signed)
    Procedures performed today:    Procedure: Real-time Ultrasound Guided injection of the right knee Device: Samsung HS60  Verbal informed consent obtained.  Time-out conducted.  Noted no overlying erythema, induration, or other signs of local infection.  Skin prepped in a sterile fashion.  Local anesthesia: Topical Ethyl chloride.  With sterile technique and under real time ultrasound guidance:  30 mg/2 mL of OrthoVisc (sodium hyaluronate) in a prefilled syringe was injected easily into the knee through a 22-gauge needle.   Completed without difficulty  Advised to call if fevers/chills, erythema, induration, drainage, or persistent bleeding.  Images permanently stored and available for review in PACS.  Impression: Technically successful ultrasound guided injection.  Independent interpretation of notes and tests performed by another provider:   None.  Brief History, Exam, Impression, and Recommendations:    Primary osteoarthritis of both knees Orthovisc No. 2 of 4 into the right knee, return in 1 week for #3 of 4.    ___________________________________________ Gwen Her. Dianah Field, M.D., ABFM., CAQSM. Primary Care and [place] Instructor of [place] of [facility] of Medicine

## 2019-12-30 ENCOUNTER — Ambulatory Visit (INDEPENDENT_AMBULATORY_CARE_PROVIDER_SITE_OTHER): Payer: Medicare Other | Admitting: Sports Medicine

## 2019-12-30 ENCOUNTER — Ambulatory Visit (INDEPENDENT_AMBULATORY_CARE_PROVIDER_SITE_OTHER): Payer: Medicare Other

## 2019-12-30 ENCOUNTER — Other Ambulatory Visit: Payer: Self-pay

## 2019-12-30 DIAGNOSIS — M170 Bilateral primary osteoarthritis of knee: Secondary | ICD-10-CM

## 2019-12-30 NOTE — Progress Notes (Signed)
    Procedures performed today:    Procedure: Real-time Ultrasound Guided injection of the right knee Device: Samsung HS60  Verbal informed consent obtained.  Time-out conducted.  Noted no overlying erythema, induration, or other signs of local infection.  Skin prepped in a sterile fashion.  Local anesthesia: Topical Ethyl chloride.  With sterile technique and under real time ultrasound guidance:  30 mg/2 mL of OrthoVisc (sodium hyaluronate) in a prefilled syringe was injected easily into the knee through a 22-gauge needle. Completed without difficulty  Advised to call if fevers/chills, erythema, induration, drainage, or persistent bleeding.  Images permanently stored and available for review in PACS.  Impression: Technically successful ultrasound guided injection.  Independent interpretation of notes and tests performed by another provider:   None.  Brief History, Exam, Impression, and Recommendations:    Primary osteoarthritis of both knees Orthovisc No. 3 of 4 right knee, return in 1 week for #4 of 4.    ___________________________________________ Gwen Her. Dianah Field, M.D., ABFM., CAQSM. Primary Care and [place] Instructor of [place] of [facility] of Medicine

## 2019-12-30 NOTE — Assessment & Plan Note (Signed)
Orthovisc No. 3 of 4 right knee, return in 1 week for #4 of 4.

## 2020-01-05 ENCOUNTER — Other Ambulatory Visit: Payer: Medicare Other

## 2020-01-05 ENCOUNTER — Ambulatory Visit: Payer: Medicare Other | Admitting: Sports Medicine

## 2020-01-05 DIAGNOSIS — Z20822 Contact with and (suspected) exposure to covid-19: Secondary | ICD-10-CM | POA: Diagnosis not present

## 2020-01-07 LAB — NOVEL CORONAVIRUS, NAA: SARS-CoV-2, NAA: NOT DETECTED

## 2020-01-07 LAB — SARS-COV-2, NAA 2 DAY TAT

## 2020-01-09 ENCOUNTER — Ambulatory Visit (INDEPENDENT_AMBULATORY_CARE_PROVIDER_SITE_OTHER): Payer: Medicare Other | Admitting: Sports Medicine

## 2020-01-09 ENCOUNTER — Ambulatory Visit (INDEPENDENT_AMBULATORY_CARE_PROVIDER_SITE_OTHER): Payer: Medicare Other

## 2020-01-09 ENCOUNTER — Other Ambulatory Visit: Payer: Self-pay

## 2020-01-09 ENCOUNTER — Other Ambulatory Visit: Payer: Self-pay | Admitting: *Deleted

## 2020-01-09 DIAGNOSIS — M170 Bilateral primary osteoarthritis of knee: Secondary | ICD-10-CM

## 2020-01-09 DIAGNOSIS — R059 Cough, unspecified: Secondary | ICD-10-CM | POA: Diagnosis not present

## 2020-01-09 MED ORDER — BENZONATATE 200 MG PO CAPS: 200.0000 mg | ORAL_CAPSULE | Freq: Three times a day (TID) | ORAL | 0 refills | Status: DC | PRN

## 2020-01-09 NOTE — Assessment & Plan Note (Signed)
Orthovisc No. 4 of 4 right knee, return as needed.

## 2020-01-09 NOTE — Assessment & Plan Note (Signed)
Increasing cough for a week now, nonproductive, no constitutional symptoms, former smoker. Chest is clear to auscultation, adding chest x-ray, Tessalon Perles, this is likely simple bronchitis. It sounds like she was prescribed a course of antibiotics already by an outside provider, she will also keep primary care concerns with her PCP in the future.

## 2020-01-09 NOTE — Progress Notes (Signed)
    Procedures performed today:    Procedure: Real-time Ultrasound Guided injection of the right knee Device: Samsung HS60  Verbal informed consent obtained.  Time-out conducted.  Noted no overlying erythema, induration, or other signs of local infection.  Skin prepped in a sterile fashion.  Local anesthesia: Topical Ethyl chloride.  With sterile technique and under real time ultrasound guidance:  30 mg/2 mL of OrthoVisc (sodium hyaluronate) in a prefilled syringe was injected easily into the knee through a 22-gauge needle.   Completed without difficulty  Advised to call if fevers/chills, erythema, induration, drainage, or persistent bleeding.  Images permanently stored and available for review in PACS.  Impression: Technically successful ultrasound guided injection.  Independent interpretation of notes and tests performed by another provider:   None.  Brief History, Exam, Impression, and Recommendations:    Primary osteoarthritis of both knees Orthovisc No. 4 of 4 right knee, return as needed.  Coughing Increasing cough for a week now, nonproductive, no constitutional symptoms, former smoker. Chest is clear to auscultation, adding chest x-ray, Tessalon Perles, this is likely simple bronchitis. It sounds like she was prescribed a course of antibiotics already by an outside provider, she will also keep primary care concerns with her PCP in the future.    ___________________________________________ Gwen Her. Dianah Field, M.D., ABFM., CAQSM. Primary Care and [place] Instructor of [place] of [facility] of Medicine

## 2020-01-10 DIAGNOSIS — Z85828 Personal history of other malignant neoplasm of skin: Secondary | ICD-10-CM | POA: Diagnosis not present

## 2020-01-10 DIAGNOSIS — D1801 Hemangioma of skin and subcutaneous tissue: Secondary | ICD-10-CM | POA: Diagnosis not present

## 2020-01-10 DIAGNOSIS — L570 Actinic keratosis: Secondary | ICD-10-CM | POA: Diagnosis not present

## 2020-01-10 DIAGNOSIS — L820 Inflamed seborrheic keratosis: Secondary | ICD-10-CM | POA: Diagnosis not present

## 2020-01-10 DIAGNOSIS — D225 Melanocytic nevi of trunk: Secondary | ICD-10-CM | POA: Diagnosis not present

## 2020-01-10 DIAGNOSIS — L814 Other melanin hyperpigmentation: Secondary | ICD-10-CM | POA: Diagnosis not present

## 2020-01-10 DIAGNOSIS — L821 Other seborrheic keratosis: Secondary | ICD-10-CM | POA: Diagnosis not present

## 2020-01-17 DIAGNOSIS — Z20828 Contact with and (suspected) exposure to other viral communicable diseases: Secondary | ICD-10-CM | POA: Diagnosis not present

## 2020-01-24 DIAGNOSIS — I10 Essential (primary) hypertension: Secondary | ICD-10-CM | POA: Diagnosis not present

## 2020-01-24 DIAGNOSIS — E782 Mixed hyperlipidemia: Secondary | ICD-10-CM | POA: Diagnosis not present

## 2020-01-26 DIAGNOSIS — E782 Mixed hyperlipidemia: Secondary | ICD-10-CM | POA: Diagnosis not present

## 2020-01-26 DIAGNOSIS — I700 Atherosclerosis of aorta: Secondary | ICD-10-CM | POA: Diagnosis not present

## 2020-01-26 DIAGNOSIS — I10 Essential (primary) hypertension: Secondary | ICD-10-CM | POA: Diagnosis not present

## 2020-01-26 DIAGNOSIS — M8580 Other specified disorders of bone density and structure, unspecified site: Secondary | ICD-10-CM | POA: Diagnosis not present

## 2020-02-09 ENCOUNTER — Ambulatory Visit (INDEPENDENT_AMBULATORY_CARE_PROVIDER_SITE_OTHER): Payer: Medicare Other

## 2020-02-09 ENCOUNTER — Ambulatory Visit (INDEPENDENT_AMBULATORY_CARE_PROVIDER_SITE_OTHER): Payer: Medicare Other | Admitting: Podiatry

## 2020-02-09 ENCOUNTER — Other Ambulatory Visit: Payer: Self-pay

## 2020-02-09 ENCOUNTER — Encounter: Payer: Self-pay | Admitting: Podiatry

## 2020-02-09 DIAGNOSIS — M79672 Pain in left foot: Secondary | ICD-10-CM

## 2020-02-10 NOTE — Progress Notes (Signed)
Subjective:   Patient ID: Tammy Weeks, female   DOB: 65 y.o.   MRN: [id number]   HPI Patient presents stating that she hit her foot on a steel trash can and she is concerned about fracture with quite a bit of swelling of her left forefoot   ROS      Objective:  Physical Exam  Neurovascular status intact with patient's left forefoot showing swelling centered around the third and fourth metatarsal phalangeal joint and extending into the digit.  There is some bruising also negative Homans' sign noted     Assessment:  Trauma to the left forefoot secondary to injury     Plan:  H&P reviewed x-ray and condition and recommended continuation of wider type shoes rigid bottom shoes and if symptoms persist she will be seen back in the next 3 to 4 weeks and is encouraged to use ice topical anti-inflammatories  X-rays indicate no indication of fracture appears to be contusion type injury

## 2020-02-21 DIAGNOSIS — H52203 Unspecified astigmatism, bilateral: Secondary | ICD-10-CM | POA: Diagnosis not present

## 2020-02-21 DIAGNOSIS — H5203 Hypermetropia, bilateral: Secondary | ICD-10-CM | POA: Diagnosis not present

## 2020-02-21 DIAGNOSIS — H25043 Posterior subcapsular polar age-related cataract, bilateral: Secondary | ICD-10-CM | POA: Diagnosis not present

## 2020-03-19 DIAGNOSIS — Z1389 Encounter for screening for other disorder: Secondary | ICD-10-CM | POA: Diagnosis not present

## 2020-03-19 DIAGNOSIS — M8580 Other specified disorders of bone density and structure, unspecified site: Secondary | ICD-10-CM | POA: Diagnosis not present

## 2020-03-19 DIAGNOSIS — I10 Essential (primary) hypertension: Secondary | ICD-10-CM | POA: Diagnosis not present

## 2020-03-19 DIAGNOSIS — Z0000 Encounter for general adult medical examination without abnormal findings: Secondary | ICD-10-CM | POA: Diagnosis not present

## 2020-03-19 DIAGNOSIS — E782 Mixed hyperlipidemia: Secondary | ICD-10-CM | POA: Diagnosis not present

## 2020-03-19 DIAGNOSIS — I700 Atherosclerosis of aorta: Secondary | ICD-10-CM | POA: Diagnosis not present

## 2020-03-23 DIAGNOSIS — Z20822 Contact with and (suspected) exposure to covid-19: Secondary | ICD-10-CM | POA: Diagnosis not present

## 2020-04-13 DIAGNOSIS — Z780 Asymptomatic menopausal state: Secondary | ICD-10-CM | POA: Diagnosis not present

## 2020-04-13 DIAGNOSIS — M8589 Other specified disorders of bone density and structure, multiple sites: Secondary | ICD-10-CM | POA: Diagnosis not present

## 2020-04-20 DIAGNOSIS — L738 Other specified follicular disorders: Secondary | ICD-10-CM | POA: Diagnosis not present

## 2020-04-20 DIAGNOSIS — L720 Epidermal cyst: Secondary | ICD-10-CM | POA: Diagnosis not present

## 2020-04-20 DIAGNOSIS — Z85828 Personal history of other malignant neoplasm of skin: Secondary | ICD-10-CM | POA: Diagnosis not present

## 2020-05-01 DIAGNOSIS — Z779 Other contact with and (suspected) exposures hazardous to health: Secondary | ICD-10-CM | POA: Diagnosis not present

## 2020-05-01 DIAGNOSIS — R895 Abnormal microbiological findings in specimens from other organs, systems and tissues: Secondary | ICD-10-CM | POA: Diagnosis not present

## 2020-05-01 DIAGNOSIS — Z6829 Body mass index (BMI) 29.0-29.9, adult: Secondary | ICD-10-CM | POA: Diagnosis not present

## 2020-05-01 DIAGNOSIS — Z124 Encounter for screening for malignant neoplasm of cervix: Secondary | ICD-10-CM | POA: Diagnosis not present

## 2020-05-01 DIAGNOSIS — Z87448 Personal history of other diseases of urinary system: Secondary | ICD-10-CM | POA: Diagnosis not present

## 2020-05-01 DIAGNOSIS — R319 Hematuria, unspecified: Secondary | ICD-10-CM | POA: Diagnosis not present

## 2020-05-02 DIAGNOSIS — Z20828 Contact with and (suspected) exposure to other viral communicable diseases: Secondary | ICD-10-CM | POA: Diagnosis not present

## 2020-05-16 DIAGNOSIS — E782 Mixed hyperlipidemia: Secondary | ICD-10-CM | POA: Diagnosis not present

## 2020-05-21 ENCOUNTER — Other Ambulatory Visit: Payer: Self-pay

## 2020-05-21 ENCOUNTER — Ambulatory Visit (INDEPENDENT_AMBULATORY_CARE_PROVIDER_SITE_OTHER): Payer: Medicare Other

## 2020-05-21 ENCOUNTER — Ambulatory Visit (INDEPENDENT_AMBULATORY_CARE_PROVIDER_SITE_OTHER): Payer: Medicare Other | Admitting: Sports Medicine

## 2020-05-21 DIAGNOSIS — M5416 Radiculopathy, lumbar region: Secondary | ICD-10-CM | POA: Insufficient documentation

## 2020-05-21 DIAGNOSIS — M419 Scoliosis, unspecified: Secondary | ICD-10-CM | POA: Diagnosis not present

## 2020-05-21 DIAGNOSIS — M546 Pain in thoracic spine: Secondary | ICD-10-CM

## 2020-05-21 DIAGNOSIS — M47817 Spondylosis without myelopathy or radiculopathy, lumbosacral region: Secondary | ICD-10-CM | POA: Insufficient documentation

## 2020-05-21 MED ORDER — TRAMADOL HCL 50 MG PO TABS: 50.0000 mg | ORAL_TABLET | Freq: Three times a day (TID) | ORAL | 0 refills | Status: DC | PRN

## 2020-05-21 NOTE — Progress Notes (Addendum)
    Procedures performed today:    None.  Independent interpretation of notes and tests performed by another provider:   X-rays personally reviewed, there are some degenerative changes but no obvious fractures.  Brief History, Exam, Impression, and Recommendations:    Acute midline thoracic back pain This is a very pleasant 65 year old female, she had an accidental fall a few days ago and has had fairly moderate to severe pain in the mid back between the scapula. On exam she has tenderness to palpation and percussion over her thoracic spinous processes from approximately T6-T9. We certainly need some thoracic spine x-rays, adding tramadol for pain. She does have a trip coming up to [place] with her husband and is really hoping to be better by then. My hope is that she has simply contused her back.    ___________________________________________ Gwen Her. Dianah Field, M.D., ABFM., CAQSM. Primary Care and [place] Instructor of [place] of [facility] of Medicine

## 2020-05-21 NOTE — Assessment & Plan Note (Signed)
This is a very pleasant 65 year old female, she had an accidental fall a few days ago and has had fairly moderate to severe pain in the mid back between the scapula. On exam she has tenderness to palpation and percussion over her thoracic spinous processes from approximately T6-T9. We certainly need some thoracic spine x-rays, adding tramadol for pain. She does have a trip coming up to [place] with her husband and is really hoping to be better by then. My hope is that she has simply contused her back.

## 2020-05-23 DIAGNOSIS — Z20822 Contact with and (suspected) exposure to covid-19: Secondary | ICD-10-CM | POA: Diagnosis not present

## 2020-05-23 DIAGNOSIS — J029 Acute pharyngitis, unspecified: Secondary | ICD-10-CM | POA: Diagnosis not present

## 2020-05-23 DIAGNOSIS — J039 Acute tonsillitis, unspecified: Secondary | ICD-10-CM | POA: Diagnosis not present

## 2020-05-23 DIAGNOSIS — R059 Cough, unspecified: Secondary | ICD-10-CM | POA: Diagnosis not present

## 2020-05-31 DIAGNOSIS — L821 Other seborrheic keratosis: Secondary | ICD-10-CM | POA: Diagnosis not present

## 2020-05-31 DIAGNOSIS — L570 Actinic keratosis: Secondary | ICD-10-CM | POA: Diagnosis not present

## 2020-05-31 DIAGNOSIS — Z85828 Personal history of other malignant neoplasm of skin: Secondary | ICD-10-CM | POA: Diagnosis not present

## 2020-05-31 DIAGNOSIS — D485 Neoplasm of uncertain behavior of skin: Secondary | ICD-10-CM | POA: Diagnosis not present

## 2020-06-04 DIAGNOSIS — Z20822 Contact with and (suspected) exposure to covid-19: Secondary | ICD-10-CM | POA: Diagnosis not present

## 2020-06-04 DIAGNOSIS — Z23 Encounter for immunization: Secondary | ICD-10-CM | POA: Diagnosis not present

## 2020-06-05 DIAGNOSIS — Z20822 Contact with and (suspected) exposure to covid-19: Secondary | ICD-10-CM | POA: Diagnosis not present

## 2020-06-06 DIAGNOSIS — Z20822 Contact with and (suspected) exposure to covid-19: Secondary | ICD-10-CM | POA: Diagnosis not present

## 2020-06-17 DIAGNOSIS — Z20822 Contact with and (suspected) exposure to covid-19: Secondary | ICD-10-CM | POA: Diagnosis not present

## 2020-06-26 DIAGNOSIS — Z85828 Personal history of other malignant neoplasm of skin: Secondary | ICD-10-CM | POA: Diagnosis not present

## 2020-06-26 DIAGNOSIS — D1801 Hemangioma of skin and subcutaneous tissue: Secondary | ICD-10-CM | POA: Diagnosis not present

## 2020-06-26 DIAGNOSIS — L814 Other melanin hyperpigmentation: Secondary | ICD-10-CM | POA: Diagnosis not present

## 2020-06-26 DIAGNOSIS — L821 Other seborrheic keratosis: Secondary | ICD-10-CM | POA: Diagnosis not present

## 2020-06-26 DIAGNOSIS — D2372 Other benign neoplasm of skin of left lower limb, including hip: Secondary | ICD-10-CM | POA: Diagnosis not present

## 2020-06-26 DIAGNOSIS — L570 Actinic keratosis: Secondary | ICD-10-CM | POA: Diagnosis not present

## 2020-06-27 ENCOUNTER — Ambulatory Visit (INDEPENDENT_AMBULATORY_CARE_PROVIDER_SITE_OTHER): Payer: Medicare Other | Admitting: Sports Medicine

## 2020-06-27 ENCOUNTER — Ambulatory Visit (INDEPENDENT_AMBULATORY_CARE_PROVIDER_SITE_OTHER): Payer: Medicare Other

## 2020-06-27 ENCOUNTER — Other Ambulatory Visit: Payer: Self-pay

## 2020-06-27 DIAGNOSIS — W19XXXA Unspecified fall, initial encounter: Secondary | ICD-10-CM | POA: Diagnosis not present

## 2020-06-27 DIAGNOSIS — M25552 Pain in left hip: Secondary | ICD-10-CM | POA: Diagnosis not present

## 2020-06-27 DIAGNOSIS — M5432 Sciatica, left side: Secondary | ICD-10-CM | POA: Diagnosis not present

## 2020-06-27 DIAGNOSIS — M533 Sacrococcygeal disorders, not elsewhere classified: Secondary | ICD-10-CM | POA: Diagnosis not present

## 2020-06-27 MED ORDER — NAPROXEN 500 MG PO TABS: 500.0000 mg | ORAL_TABLET | Freq: Two times a day (BID) | ORAL | 3 refills | Status: DC

## 2020-06-27 NOTE — Assessment & Plan Note (Signed)
This is a pleasant 65 year old female, she had just recovered from her previous fall, while in [place] unfortunately she fell backwards onto her butt, approximately 2 to 3 weeks ago, she now has worsening pain near her left sacroiliac joint, radiation down into the posterolateral thigh. She is ambulatory. No bowel or bladder dysfunction, saddle numbness, constitutional symptoms. We will add prescription strength naproxen, she will keep her tramadol on hand for breakthrough pain. Getting x-rays of the sacrum, coccyx, left hip. Does have another trip coming up over the next few days.

## 2020-06-27 NOTE — Progress Notes (Signed)
    Procedures performed today:    None.  Independent interpretation of notes and tests performed by another provider:   None.  Brief History, Exam, Impression, and Recommendations:    Fall This is a pleasant 65 year old female, she had just recovered from her previous fall, while in [place] unfortunately she fell backwards onto her butt, approximately 2 to 3 weeks ago, she now has worsening pain near her left sacroiliac joint, radiation down into the posterolateral thigh. She is ambulatory. No bowel or bladder dysfunction, saddle numbness, constitutional symptoms. We will add prescription strength naproxen, she will keep her tramadol on hand for breakthrough pain. Getting x-rays of the sacrum, coccyx, left hip. Does have another trip coming up over the next few days.    ___________________________________________ Gwen Her. Dianah Field, M.D., ABFM., CAQSM. Primary Care and [place] Instructor of [place] of [facility] of Medicine

## 2020-07-27 ENCOUNTER — Telehealth: Payer: Self-pay

## 2020-07-27 DIAGNOSIS — M5416 Radiculopathy, lumbar region: Secondary | ICD-10-CM

## 2020-07-27 DIAGNOSIS — W19XXXD Unspecified fall, subsequent encounter: Secondary | ICD-10-CM

## 2020-07-27 NOTE — Telephone Encounter (Signed)
Patient left msg that she wants to proceed with MRI. She will return from her trip at the beginning of next week.   Left patient a msg that you were out of the office until July 19th and this would be addressed after that time.

## 2020-07-27 NOTE — Telephone Encounter (Signed)
MRI ordered

## 2020-07-30 ENCOUNTER — Ambulatory Visit (INDEPENDENT_AMBULATORY_CARE_PROVIDER_SITE_OTHER): Payer: Medicare Other

## 2020-07-30 ENCOUNTER — Other Ambulatory Visit: Payer: Self-pay

## 2020-07-30 DIAGNOSIS — W19XXXD Unspecified fall, subsequent encounter: Secondary | ICD-10-CM

## 2020-07-30 DIAGNOSIS — M5127 Other intervertebral disc displacement, lumbosacral region: Secondary | ICD-10-CM | POA: Diagnosis not present

## 2020-07-30 DIAGNOSIS — M48061 Spinal stenosis, lumbar region without neurogenic claudication: Secondary | ICD-10-CM | POA: Diagnosis not present

## 2020-07-30 DIAGNOSIS — W19XXXA Unspecified fall, initial encounter: Secondary | ICD-10-CM

## 2020-07-30 DIAGNOSIS — M47897 Other spondylosis, lumbosacral region: Secondary | ICD-10-CM

## 2020-07-30 DIAGNOSIS — M4699 Unspecified inflammatory spondylopathy, multiple sites in spine: Secondary | ICD-10-CM | POA: Diagnosis not present

## 2020-07-30 DIAGNOSIS — M5126 Other intervertebral disc displacement, lumbar region: Secondary | ICD-10-CM | POA: Diagnosis not present

## 2020-07-30 NOTE — Telephone Encounter (Signed)
Left msg for patient that MRI had been ordered. We would work on getting it approved through insurance. Once this was accomplished, she would receive a call to schedule.

## 2020-08-03 DIAGNOSIS — S63653A Sprain of metacarpophalangeal joint of left middle finger, initial encounter: Secondary | ICD-10-CM | POA: Diagnosis not present

## 2020-08-03 DIAGNOSIS — M65342 Trigger finger, left ring finger: Secondary | ICD-10-CM | POA: Diagnosis not present

## 2020-08-03 DIAGNOSIS — S6992XA Unspecified injury of left wrist, hand and finger(s), initial encounter: Secondary | ICD-10-CM | POA: Diagnosis not present

## 2020-08-05 DIAGNOSIS — M47897 Other spondylosis, lumbosacral region: Secondary | ICD-10-CM

## 2020-08-06 DIAGNOSIS — Z1231 Encounter for screening mammogram for malignant neoplasm of breast: Secondary | ICD-10-CM | POA: Diagnosis not present

## 2020-08-07 ENCOUNTER — Ambulatory Visit: Payer: Self-pay

## 2020-08-07 ENCOUNTER — Other Ambulatory Visit: Payer: Self-pay

## 2020-08-07 ENCOUNTER — Ambulatory Visit (INDEPENDENT_AMBULATORY_CARE_PROVIDER_SITE_OTHER): Payer: Medicare Other | Admitting: Sports Medicine

## 2020-08-07 DIAGNOSIS — M47897 Other spondylosis, lumbosacral region: Secondary | ICD-10-CM

## 2020-08-07 DIAGNOSIS — M170 Bilateral primary osteoarthritis of knee: Secondary | ICD-10-CM

## 2020-08-07 NOTE — Progress Notes (Addendum)
    Procedures performed today:    Procedure: Real-time Ultrasound Guided injection of the left sacroiliac joint Device: Samsung HS60  Verbal informed consent obtained.  Time-out conducted.  Noted no overlying erythema, induration, or other signs of local infection.  Skin prepped in a sterile fashion.  Local anesthesia: Topical Ethyl chloride.  With sterile technique and under real time ultrasound guidance: Noted normal-appearing sacroiliac joint, 1 cc Kenalog 40, 2 cc lidocaine, 2 cc bupivacaine injected easily Completed without difficulty  Advised to call if fevers/chills, erythema, induration, drainage, or persistent bleeding.  Images permanently stored and available for review in PACS.  Impression: Technically successful ultrasound guided injection.   Independent interpretation of notes and tests performed by another provider:   None.  Brief History, Exam, Impression, and Recommendations:    Lumbosacral spondylosis This is a very pleasant 65 year old female, she has had a few falls, unfortunately she continues to have chronic pain over the past 2+ months. She is had multiple medications including naproxen, tramadol, home exercises. Continues to have discomfort in the left buttock with radiation down the back of the thigh, occasionally into the groin. Lumbar spine x-rays did show significant degenerative processes, hip x-rays were normal, lumbar spine MRI showed fairly normal discs, dominant finding was left L5-S1 facet joint osteoarthritis, clinically she has pain at the left L5-S1 facet as well as at the left sacroiliac joint, today we did a diagnostic and therapeutic left sacroiliac joint injection. Return in a month, if insufficient improvement we will target the left L5-S1 facet. We did discuss follow-up treatments including RFA.  Of note she had about 30% relief immediately following the SI joint injection.  Primary osteoarthritis of both knees Tammy Weeks has done really well  with Orthovisc in Weeks right knee, she is starting to have some discomfort in the left, I think we can just go ahead and start left knee Orthovisc when she returns if desired.    ___________________________________________ Tammy Weeks. Tammy Weeks, M.D., ABFM., CAQSM. Primary Care and [place] Instructor of [place] of [facility] of Medicine

## 2020-08-07 NOTE — Assessment & Plan Note (Addendum)
This is a very pleasant 65 year old female, she has had a few falls, unfortunately she continues to have chronic pain over the past 2+ months. She is had multiple medications including naproxen, tramadol, home exercises. Continues to have discomfort in the left buttock with radiation down the back of the thigh, occasionally into the groin. Lumbar spine x-rays did show significant degenerative processes, hip x-rays were normal, lumbar spine MRI showed fairly normal discs, dominant finding was left L5-S1 facet joint osteoarthritis, clinically she has pain at the left L5-S1 facet as well as at the left sacroiliac joint, today we did a diagnostic and therapeutic left sacroiliac joint injection. Return in a month, if insufficient improvement we will target the left L5-S1 facet. We did discuss follow-up treatments including RFA.  Of note she had about 30% relief immediately following the SI joint injection.

## 2020-08-07 NOTE — Assessment & Plan Note (Signed)
Tammy Weeks has done really well with Orthovisc in her right knee, she is starting to have some discomfort in the left, I think we can just go ahead and start left knee Orthovisc when she returns if desired.

## 2020-08-09 NOTE — Progress Notes (Signed)
error 

## 2020-08-10 ENCOUNTER — Ambulatory Visit (INDEPENDENT_AMBULATORY_CARE_PROVIDER_SITE_OTHER): Payer: Medicare Other | Admitting: Rehabilitative and Restorative Service Providers"

## 2020-08-10 ENCOUNTER — Other Ambulatory Visit: Payer: Self-pay

## 2020-08-10 ENCOUNTER — Encounter: Payer: Self-pay | Admitting: Rehabilitative and Restorative Service Providers"

## 2020-08-10 DIAGNOSIS — R29898 Other symptoms and signs involving the musculoskeletal system: Secondary | ICD-10-CM | POA: Diagnosis not present

## 2020-08-10 DIAGNOSIS — M545 Low back pain, unspecified: Secondary | ICD-10-CM | POA: Diagnosis not present

## 2020-08-10 NOTE — Therapy (Signed)
[place], [place], [address] Phone: [phone]   Fax:  [phone]  Physical Therapy Evaluation  Patient Details  Name: Tammy Weeks MRN: [id number] Date of Birth: [birth year] Referring Provider (PT): Dr Dianah Field   Encounter Date: 08/10/2020   PT End of Session - 08/10/20 1205     Visit Number 1    Number of Visits 12    Date for PT Re-Evaluation 09/21/20    PT Start Time 0845    PT Stop Time 0933    PT Time Calculation (min) 48 min    Activity Tolerance Patient tolerated treatment well             Past Medical History:  Diagnosis Date   Anxiety    High cholesterol    Hypertension     Past Surgical History:  Procedure Laterality Date   ABDOMINAL HYSTERECTOMY      There were no vitals filed for this visit.    Subjective Assessment - 08/10/20 0857     Subjective Patient reports that she has had LBP for several years. She fell twice in the past 3 months which increased the LBP. She received injection this week with some improvement. She sat and drove yesterday which incresaed symptoms    Pertinent History history of LBP on an intermittent basis over the past 20 years; hysterectomy; HTN    Patient Stated Goals get rid of the LBP and Lt hip pain    Currently in Pain? Yes    Pain Score 2     Pain Location Back    Pain Orientation Left;Posterior;Lower    Pain Descriptors / Indicators Nagging    Pain Type Acute pain;Chronic pain    Pain Radiating Towards Lt posterior hip to groin    Pain Onset More than a month ago    Pain Frequency Intermittent    Aggravating Factors  sitting; driving    Pain Relieving Factors rest - lying on Rt side with pillow between legs                [facility] PT Assessment - 08/10/20 0001       Assessment   Medical Diagnosis Lt lumbar pain to Lt posterior hip to groin    Referring Provider (PT) Dr Dianah Field    Onset Date/Surgical Date  04/13/20    Hand Dominance Right    Next MD Visit 09/04/20    Prior Therapy yes for LB yrs ago      Precautions   Precautions None      Restrictions   Weight Bearing Restrictions No      Balance Screen   Has the patient fallen in the past 6 months Yes    How many times? 2    Has the patient had a decrease in activity level because of a fear of falling?  No    Is the patient reluctant to leave their home because of a fear of falling?  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    Leisure household chores; walking ~ 4-5 times/wk x 5 miles; travel; pickle ball      Observation/Other Assessments   Focus on Therapeutic Outcomes (FOTO)  49      Sensation   Additional Comments WFL's per pt  report      Posture/Postural Control   Posture Comments WFL's      AROM   Lumbar Flexion 80%    Lumbar Extension 50% discomfort Lt SI area    Lumbar - Right Side Bend 90%    Lumbar - Left Side Bend 90% pinch Lt LB/SI area    Lumbar - Right Rotation 30% discomfort Lt    Lumbar - Left Rotation 40%      Strength   Overall Strength Comments functional bilat LE strength      Flexibility   Hamstrings tight Lt > RT    Quadriceps WFL's bilat    ITB tight Lt    Piriformis tight Lt    Quadratus Lumborum tight Lt      Palpation   Spinal mobility hypomobile lumbar spine with PA mobs    SI assessment  tight/tender Lt    Palpation comment muscular tightness Lt > Rt; Lt posterior hip - piriformis and gluts      Special Tests   Other special tests (-) SLR                        Objective measurements completed on examination: See above findings.       [place] Adult PT Treatment/Exercise - 08/10/20 0001       Self-Care   Self-Care Other Self-Care Comments    Other Self-Care Comments  avoid crossing legs when sitting       Therapeutic Activites    Therapeutic Activities Other Therapeutic Activities    Other Therapeutic Activities provided posture handout      Lumbar Exercises: Stretches   Passive Hamstring Stretch Left;2 reps;30 seconds   supine with strap   Hip Flexor Stretch Left;Right;3 reps;30 seconds    Hip Flexor Stretch Limitations seated    Press Ups 5 reps;5 seconds    ITB Stretch Left;2 reps;30 seconds   supine with strap   Piriformis Stretch Left;2 reps;30 seconds   supine travell                   PT Education - 08/10/20 0928     Education Details HEP POC posture TENS    Person(s) Educated Patient    Methods Explanation;Demonstration;Tactile cues;Verbal cues    Comprehension Verbalized understanding;Returned demonstration;Verbal cues required;Tactile cues required                 PT Long Term Goals - 08/10/20 1227       PT LONG TERM GOAL #1   Title Decrease LBP and Lt posterior hip pain by 75-100% allowing patient to return to all normal functional activities    Time 6    Period Weeks    Status New    Target Date 09/21/20      PT LONG TERM GOAL #2   Title Increase AROM lumbar spine and Lt hip to WNL's and pain free    Time 6    Period Weeks    Status New    Target Date 09/21/20      PT LONG TERM GOAL #3   Title Patient reports return to walking program without increase in pain    Time 6    Period Weeks    Status New    Target Date 09/21/20      PT LONG TERM GOAL #4   Title Independent in HEP (including aquatic therapy program as indicated)    Time 6    Period Weeks  Status New    Target Date 09/21/20      PT LONG TERM GOAL #5   Title Improve functional limitation score to 65    Time 6    Period Weeks    Status New    Target Date 09/21/20                    Plan - 08/10/20 1212     Clinical Impression Statement Patient presents with ~ 4 month history of Lt LB and posterior hip pain wiht no known injury. 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 Patient has some limited ROM and mobility through  the lumbar spine and Lt > Rt LE; muscular tightness to palpation through hip flexors; posterior Lt hip; pain with sitting and driving. She will benefit from PT to address problems identified.    Stability/Clinical Decision Making Stable/Uncomplicated    Clinical Decision Making Low    Rehab Potential Good    PT Frequency 3x / week    PT Duration 6 weeks    PT Treatment/Interventions ADLs/Self Care Home Management;Aquatic Therapy;Cryotherapy;Electrical Stimulation;Iontophoresis '4mg'$ /ml Dexamethasone;Moist Heat;Ultrasound;Functional mobility training;Therapeutic activities;Therapeutic exercise;Balance training;Neuromuscular re-education;Patient/family education;Manual techniques;Dry needling;Taping    PT Next Visit Plan review HEP; progress with core stabilization and strengthening; manual work and possibly DN(has had DN in the past and did not like it); postural education    PT Home Exercise Plan [email or web address]    Consulted and Agree with Plan of Care Patient             Patient will benefit from skilled therapeutic intervention in order to improve the following deficits and impairments:  Pain, Decreased activity tolerance, Hypomobility, Impaired flexibility, Improper body mechanics, Decreased mobility, Decreased strength, Postural dysfunction  Visit Diagnosis: Acute left-sided low back pain without sciatica  Other symptoms and signs involving the musculoskeletal system     Problem List Patient Active Problem List   Diagnosis Date Noted   Lumbosacral spondylosis 05/21/2020   Coughing 01/09/2020   Herpes simplex 07/21/2019   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

## 2020-08-10 NOTE — Patient Instructions (Addendum)
  Access Code: [email or web address] URL: [email or web address] Date: 08/10/2020 Prepared by: Gillermo Murdoch  Exercises Prone Press Up - 2 x daily - 7 x weekly - 1 sets - 10 reps - 2-3 sec hold Supine Piriformis Stretch with Leg Straight - 2 x daily - 7 x weekly - 1 sets - 3 reps - 30 sec hold Hooklying Hamstring Stretch with Strap - 2 x daily - 7 x weekly - 1 sets - 3 reps - 30 sec hold Supine ITB Stretch with Strap - 2 x daily - 7 x weekly - 1 sets - 3 reps - 30 sec hold  Patient Education Posture and Body Mechanics TENS Unit

## 2020-08-12 DIAGNOSIS — Z20822 Contact with and (suspected) exposure to covid-19: Secondary | ICD-10-CM | POA: Diagnosis not present

## 2020-08-13 ENCOUNTER — Encounter: Payer: Medicare Other | Admitting: Rehabilitative and Restorative Service Providers"

## 2020-08-14 ENCOUNTER — Telehealth: Payer: Self-pay | Admitting: Rehabilitative and Restorative Service Providers"

## 2020-08-14 NOTE — Telephone Encounter (Signed)
Patient called to cancel appointments due to [place]. She reports ~ 70% improvement in LBP and posterior hip pain. She has purchased a TENS unit and had some questions about the unit which were addressed today. Patient will continue with her HEP and is scheduled to return to PT next week.   Reka Wist P. Helene Kelp PT, MPH 08/14/20 12:56 PM

## 2020-08-15 ENCOUNTER — Encounter: Payer: Medicare Other | Admitting: Rehabilitative and Restorative Service Providers"

## 2020-08-16 ENCOUNTER — Encounter: Payer: Medicare Other | Admitting: Rehabilitative and Restorative Service Providers"

## 2020-08-17 DIAGNOSIS — Z20822 Contact with and (suspected) exposure to covid-19: Secondary | ICD-10-CM | POA: Diagnosis not present

## 2020-08-20 ENCOUNTER — Encounter: Payer: Self-pay | Admitting: Rehabilitative and Restorative Service Providers"

## 2020-08-20 ENCOUNTER — Ambulatory Visit (INDEPENDENT_AMBULATORY_CARE_PROVIDER_SITE_OTHER): Payer: Medicare Other | Admitting: Rehabilitative and Restorative Service Providers"

## 2020-08-20 ENCOUNTER — Other Ambulatory Visit: Payer: Self-pay

## 2020-08-20 DIAGNOSIS — M545 Low back pain, unspecified: Secondary | ICD-10-CM | POA: Diagnosis not present

## 2020-08-20 DIAGNOSIS — R29898 Other symptoms and signs involving the musculoskeletal system: Secondary | ICD-10-CM | POA: Diagnosis not present

## 2020-08-20 NOTE — Therapy (Signed)
[place], [place], [address] Phone: [phone]   Fax:  [phone]  Physical Therapy Treatment  Patient Details  Name: Tammy Weeks MRN: [id number] Date of Birth: 06-21-1955 Referring Provider (PT): Dr Dianah Field   Encounter Date: 08/20/2020   PT End of Session - 08/20/20 1429     Visit Number 2    Number of Visits 12    Date for PT Re-Evaluation 09/21/20    PT Start Time [id number]    PT Stop Time 1516    PT Time Calculation (min) 48 min    Activity Tolerance Patient tolerated treatment well             Past Medical History:  Diagnosis Date   Anxiety    High cholesterol    Hypertension     Past Surgical History:  Procedure Laterality Date   ABDOMINAL HYSTERECTOMY      There were no vitals filed for this visit.   Subjective Assessment - 08/20/20 1430     Subjective Patient reports that hip and groin have improved. She has 90% improvement since last visit. She has had COVID and has had some body aches with COVID. She has been doing her exercises and has used the TENS unit as well.    Currently in Pain? Yes    Pain Score 1     Pain Location Back    Pain Orientation Right;Left;Posterior;Lower    Pain Type Acute pain;Chronic pain                [facility] PT Assessment - 08/20/20 0001       Assessment   Medical Diagnosis Lt lumbar pain to Lt posterior hip to groin    Referring Provider (PT) Dr Dianah Field    Onset Date/Surgical Date 04/13/20    Hand Dominance Right    Next MD Visit 09/04/20    Prior Therapy yes for LB yrs ago      AROM   Lumbar Flexion 90%    Lumbar Extension 50% discomfort Lt SI area    Lumbar - Right Side Bend 100%    Lumbar - Left Side Bend 90% pinch Lt LB/SI area    Lumbar - Right Rotation 50% discomfort Lt    Lumbar - Left Rotation 50% discomfort                           [facility] Adult PT Treatment/Exercise - 08/20/20 0001        Lumbar Exercises: Stretches   Passive Hamstring Stretch Left;2 reps;30 seconds   supine with strap   Hip Flexor Stretch Left;Right;3 reps;30 seconds    Hip Flexor Stretch Limitations seated    Press Ups 10 reps   2-3 sec hold   ITB Stretch Left;2 reps;30 seconds   supine with strap   Piriformis Stretch Left;3 reps;30 seconds   supine travell     Lumbar Exercises: Aerobic   Nustep L6 x 5 min      Lumbar Exercises: Standing   Other Standing Lumbar Exercises wall slide 10 sec x 10 reps      Lumbar Exercises: Seated   Sit to Stand 10 reps   slow stand to sit     Lumbar Exercises: Supine   AB Set Limitations 3 part core 10 sec x 5 reps      Moist Heat Therapy   Number Minutes Moist Heat 10 Minutes  Moist Heat Location Hip   posterior     Electrical Stimulation   Electrical Stimulation Location 10    Electrical Stimulation Action TENS    Electrical Stimulation Parameters to tolerance    Electrical Stimulation Goals Tone;Pain      Manual Therapy   Manual therapy comments pt prone    Joint Mobilization PA mobs Lt hip    Soft tissue mobilization deep tissue work through posterior hip- piriformis and hip abductors                         PT Long Term Goals - 08/10/20 1227       PT LONG TERM GOAL #1   Title Decrease LBP and Lt posterior hip pain by 75-100% allowing patient to return to all normal functional activities    Time 6    Period Weeks    Status New    Target Date 09/21/20      PT LONG TERM GOAL #2   Title Increase AROM lumbar spine and Lt hip to WNL's and pain free    Time 6    Period Weeks    Status New    Target Date 09/21/20      PT LONG TERM GOAL #3   Title Patient reports return to walking program without increase in pain    Time 6    Period Weeks    Status New    Target Date 09/21/20      PT LONG TERM GOAL #4   Title Independent in HEP (including aquatic therapy program as indicated)    Time 6    Period Weeks    Status New     Target Date 09/21/20      PT LONG TERM GOAL #5   Title Improve functional limitation score to 65    Time 6    Period Weeks    Status New    Target Date 09/21/20                   Plan - 08/20/20 1516     Clinical Impression Statement Patient returns with significant improvement in Lt LB and posterior hip pain after beginning exercises following initial visit. She continues to have muscular tightness in the posterior Lt hip which responded will to stretching and manual work followed by TENS and moist heat. Added core stabilization exercises without difficulty.    Rehab Potential Fair    PT Frequency 3x / week    PT Duration 6 weeks    PT Treatment/Interventions ADLs/Self Care Home Management;Aquatic Therapy;Cryotherapy;Electrical Stimulation;Iontophoresis '4mg'$ /ml Dexamethasone;Moist Heat;Ultrasound;Functional mobility training;Therapeutic activities;Therapeutic exercise;Balance training;Neuromuscular re-education;Patient/family education;Manual techniques;Dry needling;Taping    PT Next Visit Plan review HEP; progress with core stabilization and strengthening; manual work and possibly DN(has had DN in the past and did not like it); postural education    PT Home Exercise Plan [email or web address]    Consulted and Agree with Plan of Care Patient             Patient will benefit from skilled therapeutic intervention in order to improve the following deficits and impairments:     Visit Diagnosis: Acute left-sided low back pain without sciatica  Other symptoms and signs involving the musculoskeletal system     Problem List Patient Active Problem List   Diagnosis Date Noted   Lumbosacral spondylosis 05/21/2020   Coughing 01/09/2020   Herpes simplex 07/21/2019   Orbital wrinkles 08/19/2018   Primary osteoarthritis of  both knees 07/13/2015   Low grade squamous intraepithelial lesion (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

## 2020-08-22 ENCOUNTER — Ambulatory Visit (INDEPENDENT_AMBULATORY_CARE_PROVIDER_SITE_OTHER): Payer: Medicare Other | Admitting: Rehabilitative and Restorative Service Providers"

## 2020-08-22 ENCOUNTER — Other Ambulatory Visit: Payer: Self-pay

## 2020-08-22 ENCOUNTER — Encounter: Payer: Self-pay | Admitting: Rehabilitative and Restorative Service Providers"

## 2020-08-22 DIAGNOSIS — R29898 Other symptoms and signs involving the musculoskeletal system: Secondary | ICD-10-CM | POA: Diagnosis not present

## 2020-08-22 DIAGNOSIS — M545 Low back pain, unspecified: Secondary | ICD-10-CM

## 2020-08-22 NOTE — Patient Instructions (Addendum)
Trigger Point Dry Needling  What is Trigger Point Dry Needling (DN)? DN is a physical therapy technique used to treat muscle pain and dysfunction. Specifically, DN helps deactivate muscle trigger points (muscle knots).  A thin filiform needle is used to penetrate the skin and stimulate the underlying trigger point. The goal is for a local twitch response (LTR) to occur and for the trigger point to relax. No medication of any kind is injected during the procedure.   What Does Trigger Point Dry Needling Feel Like?  The procedure feels different for each individual patient. Some patients report that they do not actually feel the needle enter the skin and overall the process is not painful. Very mild bleeding may occur. However, many patients feel a deep cramping in the muscle in which the needle was inserted. This is the local twitch response.   How Will I feel after the treatment? Soreness is normal, and the onset of soreness may not occur for a few hours. Typically this soreness does not last longer than two days.  Bruising is uncommon, however; ice can be used to decrease any possible bruising.  In rare cases feeling tired or nauseous after the treatment is normal. In addition, your symptoms may get worse before they get better, this period will typically not last longer than 24 hours.   What Can I do After My Treatment? Increase your hydration by drinking more water for the next 24 hours. You may place ice or heat on the areas treated that have become sore, however, do not use heat on inflamed or bruised areas. Heat often brings more relief post needling. You can continue your regular activities, but vigorous activity is not recommended initially after the treatment for 24 hours. DN is best combined with other physical therapy such as strengthening, stretching, and other therapies.  Access Code: [email or web address] URL: [email or web address] Date: 08/22/2020 Prepared by: Gillermo Murdoch  Exercises Prone Press Up - 2 x daily - 7 x weekly - 1 sets - 10 reps - 2-3 sec hold Supine Piriformis Stretch with Leg Straight - 2 x daily - 7 x weekly - 1 sets - 3 reps - 30 sec hold Hooklying Hamstring Stretch with Strap - 2 x daily - 7 x weekly - 1 sets - 3 reps - 30 sec hold Supine ITB Stretch with Strap - 2 x daily - 7 x weekly - 1 sets - 3 reps - 30 sec hold Sit to Stand - 2 x daily - 7 x weekly - 1 sets - 10 reps - 3-5 sec hold Wall Quarter Squat - 2 x daily - 7 x weekly - 1-2 sets - 10 reps - 5-10 sec hold Shoulder External Rotation and Scapular Retraction with Resistance - 2 x daily - 7 x weekly - 10 reps - 3 sets Scapular Retraction with Resistance - 2 x daily - 7 x weekly - 10 reps - 3 sets Scapular Retraction with Resistance Advanced - 2 x daily - 7 x weekly - 10 reps - 3 sets Anti-Rotation Lateral Stepping with Press - 2 x daily - 7 x weekly - 1-2 sets - 10 reps - 2-3 sec hold

## 2020-08-22 NOTE — Therapy (Signed)
[place], [place], [address] Phone: [phone]   Fax:  [phone]  Physical Therapy Treatment  Patient Details  Name: Tammy Weeks MRN: [id number] Date of Birth: 03-02-1955 Referring Provider (PT): Dr Dianah Field   Encounter Date: 08/22/2020   PT End of Session - 08/22/20 0849     Visit Number 3    Number of Visits 12    Date for PT Re-Evaluation 09/21/20    PT Start Time 0845    PT Stop Time 0940    PT Time Calculation (min) 55 min    Activity Tolerance Patient tolerated treatment well             Past Medical History:  Diagnosis Date   Anxiety    High cholesterol    Hypertension     Past Surgical History:  Procedure Laterality Date   ABDOMINAL HYSTERECTOMY      There were no vitals filed for this visit.   Subjective Assessment - 08/22/20 0850     Subjective Patient reports that she is sore in the posterior Lt hip - maybe from ball release work with a tennis ball, could not find a 4 inch ball    Currently in Pain? No/denies    Pain Score 0-No pain    Pain Location Back    Pain Orientation Right;Posterior                               [facility] Adult PT Treatment/Exercise - 08/22/20 0001       Lumbar Exercises: Stretches   Passive Hamstring Stretch Left;2 reps;30 seconds   supine with strap   Press Ups 10 reps   2-3 sec hold   ITB Stretch Left;2 reps;30 seconds   supine with strap   Piriformis Stretch Left;3 reps;30 seconds   supine travell     Lumbar Exercises: Aerobic   Nustep L6 x 5 min      Lumbar Exercises: Standing   Lifting From 12";10 reps    Lifting Weights (lbs) 10    Lifting Limitations hinged hip    Wall Slides 10 reps   10 sec   Wall Slides Limitations 10 # KB    Row Strengthening;Both;10 reps;Theraband    Theraband Level (Row) Level 3 (Green)    Shoulder Extension Strengthening;Both;10 reps;Theraband    Theraband Level  (Shoulder Extension) Level 3 (Green)    Other Standing Lumbar Exercises antirotation green TB x 10 each side      Lumbar Exercises: Seated   Sit to Stand 10 reps   slow stand to sit 10# KB at chest     Lumbar Exercises: Supine   AB Set Limitations 3 part core 10 sec x 5 reps      Moist Heat Therapy   Number Minutes Moist Heat 15 Minutes    Moist Heat Location Hip   posterior     Electrical Stimulation   Electrical Stimulation Location 15    Electrical Stimulation Action TENS    Electrical Stimulation Parameters to tolerance    Electrical Stimulation Goals Tone;Pain      Manual Therapy   Manual therapy comments skilled palpatioin for assessment of DN and manual work    Soft tissue mobilization deep tissue work through posterior hip- piriformis and hip abductors              Trigger Point Dry Needling - 08/22/20 0001  Consent Given? Yes    Education Handout Provided Yes    Gluteus Maximus Response Palpable increased muscle length    Piriformis Response Palpable increased muscle length                  PT Education - 08/22/20 0917     Education Details HEP DN    Person(s) Educated Patient    Methods Explanation;Demonstration;Tactile cues;Verbal cues;Handout    Comprehension Verbalized understanding;Returned demonstration;Verbal cues required;Tactile cues required                 PT Long Term Goals - 08/10/20 1227       PT LONG TERM GOAL #1   Title Decrease LBP and Lt posterior hip pain by 75-100% allowing patient to return to all normal functional activities    Time 6    Period Weeks    Status New    Target Date 09/21/20      PT LONG TERM GOAL #2   Title Increase AROM lumbar spine and Lt hip to WNL's and pain free    Time 6    Period Weeks    Status New    Target Date 09/21/20      PT LONG TERM GOAL #3   Title Patient reports return to walking program without increase in pain    Time 6    Period Weeks    Status New    Target Date  09/21/20      PT LONG TERM GOAL #4   Title Independent in HEP (including aquatic therapy program as indicated)    Time 6    Period Weeks    Status New    Target Date 09/21/20      PT LONG TERM GOAL #5   Title Improve functional limitation score to 65    Time 6    Period Weeks    Status New    Target Date 09/21/20                   Plan - 08/22/20 0904     Clinical Impression Statement Continued improvement in symptoms. Soem soreness in the Lt posterior hip likely form myofacial release with tennis ball. Added core stabilization exercises in standing. Trial of DN Lt posterior hip tolerated well.    Rehab Potential Good    PT Frequency 3x / week    PT Duration 6 weeks    PT Treatment/Interventions ADLs/Self Care Home Management;Aquatic Therapy;Cryotherapy;Electrical Stimulation;Iontophoresis '4mg'$ /ml Dexamethasone;Moist Heat;Ultrasound;Functional mobility training;Therapeutic activities;Therapeutic exercise;Balance training;Neuromuscular re-education;Patient/family education;Manual techniques;Dry needling;Taping    PT Next Visit Plan review HEP; progress with core stabilization and strengthening; manual work and assess response to DN; postural education    PT Home Exercise Plan [id number]             Patient will benefit from skilled therapeutic intervention in order to improve the following deficits and impairments:     Visit Diagnosis: Acute left-sided low back pain without sciatica  Other symptoms and signs involving the musculoskeletal system     Problem List Patient Active Problem List   Diagnosis Date Noted   Lumbosacral spondylosis 05/21/2020   Coughing 01/09/2020   Herpes simplex 07/21/2019   Orbital wrinkles 08/19/2018   Primary osteoarthritis of both knees 07/13/2015   Low grade squamous intraepithelial lesion (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

## 2020-08-24 ENCOUNTER — Ambulatory Visit (INDEPENDENT_AMBULATORY_CARE_PROVIDER_SITE_OTHER): Payer: Medicare Other | Admitting: Rehabilitative and Restorative Service Providers"

## 2020-08-24 ENCOUNTER — Other Ambulatory Visit: Payer: Self-pay

## 2020-08-24 ENCOUNTER — Encounter: Payer: Self-pay | Admitting: Rehabilitative and Restorative Service Providers"

## 2020-08-24 DIAGNOSIS — R29898 Other symptoms and signs involving the musculoskeletal system: Secondary | ICD-10-CM | POA: Diagnosis not present

## 2020-08-24 DIAGNOSIS — M545 Low back pain, unspecified: Secondary | ICD-10-CM | POA: Diagnosis not present

## 2020-08-24 NOTE — Patient Instructions (Signed)
   Aquatic Therapy: What to Expect!  Where:  [facility] [address] [place], [place] [address] [phone]           How to Prepare:  If you require assistance with dressing, with transportation (ie: wheel chair), or toileting, a caregiver must attend the entire session with you (unless your primary therapists feels this is not necessary).   Please bring your own swim towel to dry off with.   There are Men's and Women's locker rooms with showers, as well as gender neutral bathrooms in the pool area.  Please arrive IN YOUR SUIT and a few minutes prior to your appointment - this helps to avoid delays in starting your session. Head to the pool and await your appointment on the bench on the pool deck. Please make sure to attend to any toileting needs prior to entering the pool. Once on the pool deck your therapist will ask you to sign the Patient  Consent and Assignment of Benefits form. Your therapist may take your blood pressure prior to, during and after your session if indicated. We usually try and create a home exercise program based on activities we do in the pool. Some patients do not want to or do not have the ability to participate in an aquatic home program - this is not a barrier in any way to you participating in aquatic therapy as part of your current therapy plan!  Appointments:  All sessions are 45 minutes  About the pool: Entering the pool Your therapist will assist you; there are two ways to enter the pool - stairs or a mechanical lift. Your therapist will determine the most appropriate way for you. Water temperature is usually around 91-95.  There is a lap pool with a temperature around 84 There may be other therapists and patients in the pool at the same time.   Contact Info:            To cancel appointment, please call [facility] at [phone] If you are running late, please call [facility] at  [phone]

## 2020-08-24 NOTE — Therapy (Signed)
[place], [place], [address] Phone: [phone]   Fax:  [phone]  Physical Therapy Treatment  Patient Details  Name: Tammy Weeks MRN: [id number] Date of Birth: 1955-04-26 Referring Provider (PT): Dr Dianah Field   Encounter Date: 08/24/2020   PT End of Session - 08/24/20 0851     Visit Number 4    Number of Visits 12    Date for PT Re-Evaluation 09/21/20    PT Start Time 0845    PT Stop Time 0934    PT Time Calculation (min) 49 min    Activity Tolerance Patient tolerated treatment well             Past Medical History:  Diagnosis Date   Anxiety    High cholesterol    Hypertension     Past Surgical History:  Procedure Laterality Date   ABDOMINAL HYSTERECTOMY      There were no vitals filed for this visit.   Subjective Assessment - 08/24/20 0851     Subjective Patient reports continued gradual improvement. Working on exercises at home. Had a massage this week.    Currently in Pain? No/denies    Pain Score 0-No pain                               [facility] Adult PT Treatment/Exercise - 08/24/20 0001       Self-Care   Self-Care Other Self-Care Comments    Other Self-Care Comments  sitting posture and modification with pillow/noodle for prolonged sitting      Lumbar Exercises: Stretches   Press Ups 10 reps   2-3 sec hold   ITB Stretch Left;2 reps;30 seconds   supine with strap   Piriformis Stretch Left;3 reps;30 seconds   supine travell     Lumbar Exercises: Aerobic   Nustep L6 x 6 min      Moist Heat Therapy   Number Minutes Moist Heat 10 Minutes    Moist Heat Location Hip   posterior     Electrical Stimulation   Electrical Stimulation Location 10    Electrical Stimulation Action TENS    Electrical Stimulation Parameters to tolerance    Electrical Stimulation Goals Tone;Pain      Manual Therapy   Manual therapy comments skilled palpatioin  for assessment of DN and manual work    Joint Mobilization PA mobs Lt hip    Soft tissue mobilization deep tissue work through posterior hip- piriformis and hip abductors              Trigger Point Dry Needling - 08/24/20 0001     Consent Given? Yes    Education Handout Provided Previously provided    Other Dry Needling Lt/RT    Gluteus Maximus Response Palpable increased muscle length    Piriformis Response Palpable increased muscle length                  PT Education - 08/24/20 0855     Education Details aquatic therapy    Person(s) Educated Patient    Methods Explanation    Comprehension Verbalized understanding                 PT Long Term Goals - 08/10/20 1227       PT LONG TERM GOAL #1   Title Decrease LBP and Lt posterior hip pain by 75-100% allowing patient to return to all  normal functional activities    Time 6    Period Weeks    Status New    Target Date 09/21/20      PT LONG TERM GOAL #2   Title Increase AROM lumbar spine and Lt hip to WNL's and pain free    Time 6    Period Weeks    Status New    Target Date 09/21/20      PT LONG TERM GOAL #3   Title Patient reports return to walking program without increase in pain    Time 6    Period Weeks    Status New    Target Date 09/21/20      PT LONG TERM GOAL #4   Title Independent in HEP (including aquatic therapy program as indicated)    Time 6    Period Weeks    Status New    Target Date 09/21/20      PT LONG TERM GOAL #5   Title Improve functional limitation score to 65    Time 6    Period Weeks    Status New    Target Date 09/21/20                   Plan - 08/24/20 0852     Clinical Impression Statement Continued improvement in the Rt posterior hip. Now more discomfort in the sacral area. Good response to DN with decreased muscular tightness in posterior hip. Progressing gradually toward stated goals.    Rehab Potential Good    PT Frequency 3x / week    PT  Duration 6 weeks    PT Treatment/Interventions ADLs/Self Care Home Management;Aquatic Therapy;Cryotherapy;Electrical Stimulation;Iontophoresis '4mg'$ /ml Dexamethasone;Moist Heat;Ultrasound;Functional mobility training;Therapeutic activities;Therapeutic exercise;Balance training;Neuromuscular re-education;Patient/family education;Manual techniques;Dry needling;Taping    PT Next Visit Plan review HEP; progress with core stabilization and strengthening; manual work and assess response to DN; postural education    PT Home Exercise Plan [id number]    Consulted and Agree with Plan of Care Patient             Patient will benefit from skilled therapeutic intervention in order to improve the following deficits and impairments:     Visit Diagnosis: Acute left-sided low back pain without sciatica  Other symptoms and signs involving the musculoskeletal system     Problem List Patient Active Problem List   Diagnosis Date Noted   Lumbosacral spondylosis 05/21/2020   Coughing 01/09/2020   Herpes simplex 07/21/2019   Orbital wrinkles 08/19/2018   Primary osteoarthritis of both knees 07/13/2015   Low grade squamous intraepithelial lesion (LGSIL) on cervicovaginal cytologic smear 03/19/2011    Mery Guadalupe Nilda Simmer PT, MPH 08/24/2020, 9:31 AM  [facility] [place], [place], [address] Phone: [phone]   Fax:  [phone]  Name: GENOWEFA ELKHATIB MRN: [id number] Date of Birth: 1955/07/23

## 2020-09-04 ENCOUNTER — Ambulatory Visit (INDEPENDENT_AMBULATORY_CARE_PROVIDER_SITE_OTHER): Payer: Medicare Other

## 2020-09-04 ENCOUNTER — Other Ambulatory Visit: Payer: Self-pay

## 2020-09-04 ENCOUNTER — Ambulatory Visit (INDEPENDENT_AMBULATORY_CARE_PROVIDER_SITE_OTHER): Payer: Medicare Other | Admitting: Sports Medicine

## 2020-09-04 ENCOUNTER — Encounter: Payer: Self-pay | Admitting: Rehabilitative and Restorative Service Providers"

## 2020-09-04 ENCOUNTER — Ambulatory Visit (INDEPENDENT_AMBULATORY_CARE_PROVIDER_SITE_OTHER): Payer: Medicare Other | Admitting: Rehabilitative and Restorative Service Providers"

## 2020-09-04 DIAGNOSIS — M47897 Other spondylosis, lumbosacral region: Secondary | ICD-10-CM | POA: Diagnosis not present

## 2020-09-04 DIAGNOSIS — M545 Low back pain, unspecified: Secondary | ICD-10-CM | POA: Diagnosis not present

## 2020-09-04 DIAGNOSIS — M170 Bilateral primary osteoarthritis of knee: Secondary | ICD-10-CM | POA: Diagnosis not present

## 2020-09-04 DIAGNOSIS — R29898 Other symptoms and signs involving the musculoskeletal system: Secondary | ICD-10-CM

## 2020-09-04 NOTE — Assessment & Plan Note (Addendum)
Kyli's right knee is doing really well post Orthovisc, we are starting in the left today. Orthovisc No. 1 of 4, return in 1 week for #2 of 4.

## 2020-09-04 NOTE — Progress Notes (Signed)
    Procedures performed today:    Procedure: Real-time Ultrasound Guided injection of the left knee Device: Samsung HS60  Verbal informed consent obtained.  Time-out conducted.  Noted no overlying erythema, induration, or other signs of local infection.  Skin prepped in a sterile fashion.  Local anesthesia: Topical Ethyl chloride.  With sterile technique and under real time ultrasound guidance: 30 mg/2 mL of OrthoVisc (sodium hyaluronate) in a prefilled syringe was injected easily into the knee through a 22-gauge needle.  Noted only trace effusion. Completed without difficulty  Advised to call if fevers/chills, erythema, induration, drainage, or persistent bleeding.  Images permanently stored and available for review in PACS.  Impression: Technically successful ultrasound guided injection.  Independent interpretation of notes and tests performed by another provider:   None.  Brief History, Exam, Impression, and Recommendations:    Primary osteoarthritis of both knees Tammy Weeks's right knee is doing really well post Orthovisc, we are starting in the left today. Orthovisc No. 1 of 4, return in 1 week for #2 of 4.  Lumbosacral spondylosis Tammy Weeks returns, she is a very pleasant 65 year old female, she had a few falls, and continued to have chronic pain in her back over the past 2 to 3 months, multiple medications were tried including naproxen, tramadol, home rehab exercises, discomfort was in the left buttock with radiation down to the back of the thigh, occasionally into the groin. X-rays at that time showed significant degenerative processes, hip x-rays were normal, L-spine MRI showed normal discs, the dominant finding was left L5-S1 facet joint osteoarthritis, clinically at the last visit she had pain at the left L5-S1 facet as well as the left sacroiliac joint, at the last visit we did diagnostic and therapeutic left SI joint injection, this did not provide much relief, maybe 20 to 30%  indicating that her left L5-S1 facet is the dominant pain generator, we did add some physical therapy as well at the last visit and she returns today feeling significantly better, she will continue physical therapy for now, we can add a facet joint injection in the future if she desires.  Chronic process with exacerbation and pharmacologic intervention  ___________________________________________ Gwen Her. Dianah Field, M.D., ABFM., CAQSM. Primary Care and [place] Instructor of [employer] [facility] [employer] [facility]

## 2020-09-04 NOTE — Assessment & Plan Note (Signed)
Rowynn returns, she is a very pleasant 65 year old female, she had a few falls, and continued to have chronic pain in her back over the past 2 to 3 months, multiple medications were tried including naproxen, tramadol, home rehab exercises, discomfort was in the left buttock with radiation down to the back of the thigh, occasionally into the groin. X-rays at that time showed significant degenerative processes, hip x-rays were normal, L-spine MRI showed normal discs, the dominant finding was left L5-S1 facet joint osteoarthritis, clinically at the last visit she had pain at the left L5-S1 facet as well as the left sacroiliac joint, at the last visit we did diagnostic and therapeutic left SI joint injection, this did not provide much relief, maybe 20 to 30% indicating that her left L5-S1 facet is the dominant pain generator, we did add some physical therapy as well at the last visit and she returns today feeling significantly better, she will continue physical therapy for now, we can add a facet joint injection in the future if she desires.

## 2020-09-04 NOTE — Therapy (Signed)
[place], [place], [address] Phone: [phone]   Fax:  [phone]  Physical Therapy Treatment  Patient Details  Name: Tammy Weeks MRN: [id number] Date of Birth: 10-20-1955 Referring Provider (PT): Dr Dianah Field   Encounter Date: 09/04/2020   PT End of Session - 09/04/20 0931     Visit Number 5    Number of Visits 12    Date for PT Re-Evaluation 09/21/20    PT Start Time 0929    PT Stop Time 1015    PT Time Calculation (min) 46 min    Activity Tolerance Patient tolerated treatment well             Past Medical History:  Diagnosis Date   Anxiety    High cholesterol    Hypertension     Past Surgical History:  Procedure Laterality Date   ABDOMINAL HYSTERECTOMY      There were no vitals filed for this visit.   Subjective Assessment - 09/04/20 0931     Subjective Patient reports that she has been working on her exercises at home. Found a ball to do ball massage. Just had injection Lt knee today. Is not to do anything with Lt knee today. Hips are feeling good. No pain. Still some pain in the LB. She had some pain Sunday with driving and riding several hours    Currently in Pain? Yes    Pain Location Back    Pain Orientation Lower    Pain Descriptors / Indicators --   tingy   Pain Type Chronic pain                [facility] PT Assessment - 09/04/20 0001       Assessment   Medical Diagnosis Lt lumbar pain to Lt posterior hip to groin    Referring Provider (PT) Dr Dianah Field    Onset Date/Surgical Date 04/13/20    Hand Dominance Right    Next MD Visit 09/04/20    Prior Therapy yes for LB yrs ago      AROM   Lumbar Flexion 90%    Lumbar Extension 65%    Lumbar - Right Side Bend 100%    Lumbar - Left Side Bend 100%    Lumbar - Right Rotation 65%    Lumbar - Left Rotation 65%                           [facility] Adult PT Treatment/Exercise - 09/04/20  0001       Lumbar Exercises: Stretches   Press Ups 10 reps   2-3 sec hold exhale allowing belly to sag to increase ROM     Lumbar Exercises: Standing   Row Strengthening    Theraband Level (Row) Level 3 (Green)    Shoulder Extension Strengthening;Both;10 reps;Theraband    Theraband Level (Shoulder Extension) Level 3 (Green)    Other Standing Lumbar Exercises antirotation green TB x 20 each side with 3 sec hold      Lumbar Exercises: Supine   Other Supine Lumbar Exercises alternate shoulder flexion from 90 deg; press up x 10; alternate flexion and extension x 10; biceps curl x 10; hammer curl x 10 all with 4# wt      Moist Heat Therapy   Number Minutes Moist Heat 10 Minutes    Moist Heat Location Hip   posterior     Electrical Stimulation   Electrical Stimulation  Location 10    Electrical Stimulation Action TENs    Electrical Stimulation Parameters to tolerance    Electrical Stimulation Goals Tone;Pain      Manual Therapy   Joint Mobilization PA mobs Lt/Rt hip    Soft tissue mobilization deep tissue work through posterior hip- piriformis and hip abductors                         PT Long Term Goals - 08/10/20 1227       PT LONG TERM GOAL #1   Title Decrease LBP and Lt posterior hip pain by 75-100% allowing patient to return to all normal functional activities    Time 6    Period Weeks    Status New    Target Date 09/21/20      PT LONG TERM GOAL #2   Title Increase AROM lumbar spine and Lt hip to WNL's and pain free    Time 6    Period Weeks    Status New    Target Date 09/21/20      PT LONG TERM GOAL #3   Title Patient reports return to walking program without increase in pain    Time 6    Period Weeks    Status New    Target Date 09/21/20      PT LONG TERM GOAL #4   Title Independent in HEP (including aquatic therapy program as indicated)    Time 6    Period Weeks    Status New    Target Date 09/21/20      PT LONG TERM GOAL #5   Title  Improve functional limitation score to 65    Time 6    Period Weeks    Status New    Target Date 09/21/20                   Plan - 09/04/20 0935     Clinical Impression Statement Good improvement in the Lt/Rt hips. Some discomfort in the LB. Injection in Lt knee today - unable to do exercises involving LE. Continued with core stabilization avoiding LE work, working [employer]. Excellent gains in lumbar ROM with no pain with AROM.    Rehab Potential Good    PT Frequency 3x / week    PT Duration 6 weeks    PT Treatment/Interventions ADLs/Self Care Home Management;Aquatic Therapy;Cryotherapy;Electrical Stimulation;Iontophoresis '4mg'$ /ml Dexamethasone;Moist Heat;Ultrasound;Functional mobility training;Therapeutic activities;Therapeutic exercise;Balance training;Neuromuscular re-education;Patient/family education;Manual techniques;Dry needling;Taping    PT Next Visit Plan review HEP; progress with core stabilization and strengthening; manual work and assess response to DN; postural education    PT Home Exercise Plan [id number]    Consulted and Agree with Plan of Care Patient             Patient will benefit from skilled therapeutic intervention in order to improve the following deficits and impairments:     Visit Diagnosis: Acute left-sided low back pain without sciatica  Other symptoms and signs involving the musculoskeletal system     Problem List Patient Active Problem List   Diagnosis Date Noted   Lumbosacral spondylosis 05/21/2020   Coughing 01/09/2020   Herpes simplex 07/21/2019   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

## 2020-09-05 ENCOUNTER — Encounter: Payer: Self-pay | Admitting: Rehabilitative and Restorative Service Providers"

## 2020-09-05 ENCOUNTER — Ambulatory Visit (INDEPENDENT_AMBULATORY_CARE_PROVIDER_SITE_OTHER): Payer: Medicare Other | Admitting: Rehabilitative and Restorative Service Providers"

## 2020-09-05 DIAGNOSIS — R29898 Other symptoms and signs involving the musculoskeletal system: Secondary | ICD-10-CM

## 2020-09-05 DIAGNOSIS — M545 Low back pain, unspecified: Secondary | ICD-10-CM | POA: Diagnosis not present

## 2020-09-05 NOTE — Therapy (Signed)
[place], [place], [address] Phone: [phone]   Fax:  [phone]  Physical Therapy Treatment  Patient Details  Name: Tammy Weeks MRN: [id number] Date of Birth: 12/11/1955 Referring Provider (PT): Dr Dianah Field   Encounter Date: 09/05/2020   PT End of Session - 09/05/20 0858     Visit Number 6    Number of Visits 12    Date for PT Re-Evaluation 09/21/20    PT Start Time 0847    PT Stop Time 0935    PT Time Calculation (min) 48 min    Activity Tolerance Patient tolerated treatment well             Past Medical History:  Diagnosis Date   Anxiety    High cholesterol    Hypertension     Past Surgical History:  Procedure Laterality Date   ABDOMINAL HYSTERECTOMY      There were no vitals filed for this visit.   Subjective Assessment - 09/05/20 0917     Subjective Patient has been working on HEP.    Currently in Pain? Yes    Pain Score 1     Pain Location Back    Pain Orientation Lower;Left;Right                               [facility] Adult PT Treatment/Exercise - 09/05/20 0001       Lumbar Exercises: Stretches   Passive Hamstring Stretch Left;Right;2 reps;30 seconds    Press Ups 10 reps   2-3 sec hold exhale allowing belly to sag to increase ROM   ITB Stretch Left;Right;1 rep;30 seconds    Piriformis Stretch Left;Right;3 reps;30 seconds      Lumbar Exercises: Aerobic   Nustep L5 x 5 min UE 10      Lumbar Exercises: Supine   Other Supine Lumbar Exercises hip abduction in hooklying green TB alternating LE's keeping core tight x 10 reps      Lumbar Exercises: Sidelying   Hip Abduction Right;Left;10 reps;3 seconds   leading  with heel     Moist Heat Therapy   Number Minutes Moist Heat 10 Minutes    Moist Heat Location Hip   posterior     Electrical Stimulation   Electrical Stimulation Location 10    Electrical Stimulation Action TENS     Electrical Stimulation Parameters to tolerance    Electrical Stimulation Goals Tone;Pain      Manual Therapy   Manual therapy comments skilled palpatioin for assessment of DN and manual work    Joint Mobilization PA mobs Lt/Rt hip    Soft tissue mobilization deep tissue work through posterior hip- piriformis and hip abductors              Trigger Point Dry Needling - 09/05/20 0001     Consent Given? Yes    Education Handout Provided Previously provided    Other Dry Needling Lt/RT    Gluteus Maximus Response Palpable increased muscle length    Piriformis Response Palpable increased muscle length                  PT Education - 09/05/20 0924     Education Details HEP    Person(s) Educated Patient    Methods Explanation;Demonstration;Tactile cues;Verbal cues;Handout    Comprehension Verbalized understanding;Returned demonstration;Verbal cues required  PT Long Term Goals - 08/10/20 1227       PT LONG TERM GOAL #1   Title Decrease LBP and Lt posterior hip pain by 75-100% allowing patient to return to all normal functional activities    Time 6    Period Weeks    Status New    Target Date 09/21/20      PT LONG TERM GOAL #2   Title Increase AROM lumbar spine and Lt hip to WNL's and pain free    Time 6    Period Weeks    Status New    Target Date 09/21/20      PT LONG TERM GOAL #3   Title Patient reports return to walking program without increase in pain    Time 6    Period Weeks    Status New    Target Date 09/21/20      PT LONG TERM GOAL #4   Title Independent in HEP (including aquatic therapy program as indicated)    Time 6    Period Weeks    Status New    Target Date 09/21/20      PT LONG TERM GOAL #5   Title Improve functional limitation score to 65    Time 6    Period Weeks    Status New    Target Date 09/21/20                   Plan - 09/05/20 0919     Clinical Impression Statement Persistent tightness in  the Lt/Rt posterior hips - piriformis/gluts. Good response to DN/manual work. Added strengthening exercises for bilat hips without difficulty.    Rehab Potential Good    PT Frequency 3x / week    PT Duration 6 weeks    PT Treatment/Interventions ADLs/Self Care Home Management;Aquatic Therapy;Cryotherapy;Electrical Stimulation;Iontophoresis '4mg'$ /ml Dexamethasone;Moist Heat;Ultrasound;Functional mobility training;Therapeutic activities;Therapeutic exercise;Balance training;Neuromuscular re-education;Patient/family education;Manual techniques;Dry needling;Taping    PT Next Visit Plan review HEP; progress with core stabilization and strengthening; manual work and assess response to DN; postural education    PT Home Exercise Plan [id number]    Consulted and Agree with Plan of Care Patient             Patient will benefit from skilled therapeutic intervention in order to improve the following deficits and impairments:     Visit Diagnosis: Acute left-sided low back pain without sciatica  Other symptoms and signs involving the musculoskeletal system     Problem List Patient Active Problem List   Diagnosis Date Noted   Lumbosacral spondylosis 05/21/2020   Coughing 01/09/2020   Herpes simplex 07/21/2019   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

## 2020-09-05 NOTE — Patient Instructions (Signed)
Access Code: [email or web address] URL: [email or web address] Date: 09/05/2020 Prepared by: Gillermo Murdoch  Exercises Prone Press Up - 2 x daily - 7 x weekly - 1 sets - 10 reps - 2-3 sec hold Supine Piriformis Stretch with Leg Straight - 2 x daily - 7 x weekly - 1 sets - 3 reps - 30 sec hold Hooklying Hamstring Stretch with Strap - 2 x daily - 7 x weekly - 1 sets - 3 reps - 30 sec hold Supine ITB Stretch with Strap - 2 x daily - 7 x weekly - 1 sets - 3 reps - 30 sec hold Sit to Stand - 2 x daily - 7 x weekly - 1 sets - 10 reps - 3-5 sec hold Wall Quarter Squat - 2 x daily - 7 x weekly - 1-2 sets - 10 reps - 5-10 sec hold Shoulder External Rotation and Scapular Retraction with Resistance - 2 x daily - 7 x weekly - 10 reps - 3 sets Scapular Retraction with Resistance - 2 x daily - 7 x weekly - 10 reps - 3 sets Scapular Retraction with Resistance Advanced - 2 x daily - 7 x weekly - 10 reps - 3 sets Anti-Rotation Lateral Stepping with Press - 2 x daily - 7 x weekly - 1-2 sets - 10 reps - 2-3 sec hold Hooklying Isometric Clamshell - 2 x daily - 7 x weekly - 1 sets - 10 reps - 3 sec hold Sidelying Hip Abduction - 2 x daily - 7 x weekly - 2-3 sets - 10 reps - 3-5 sec hold

## 2020-09-07 ENCOUNTER — Other Ambulatory Visit: Payer: Self-pay

## 2020-09-07 ENCOUNTER — Ambulatory Visit (INDEPENDENT_AMBULATORY_CARE_PROVIDER_SITE_OTHER): Payer: Medicare Other | Admitting: Rehabilitative and Restorative Service Providers"

## 2020-09-07 ENCOUNTER — Encounter: Payer: Self-pay | Admitting: Rehabilitative and Restorative Service Providers"

## 2020-09-07 DIAGNOSIS — M545 Low back pain, unspecified: Secondary | ICD-10-CM | POA: Diagnosis not present

## 2020-09-07 DIAGNOSIS — R29898 Other symptoms and signs involving the musculoskeletal system: Secondary | ICD-10-CM | POA: Diagnosis not present

## 2020-09-07 NOTE — Therapy (Signed)
[place], [place], [address] Phone: [phone]   Fax:  [phone]  Physical Therapy Treatment  Patient Details  Name: Tammy Weeks MRN: [id number] Date of Birth: 1955-03-03 Referring Provider (PT): Dr Dianah Field   Encounter Date: 09/07/2020   PT End of Session - 09/07/20 0931     Visit Number 7    Number of Visits 12    Date for PT Re-Evaluation 09/21/20    PT Start Time 0930    PT Stop Time 1018    PT Time Calculation (min) 48 min    Activity Tolerance Patient tolerated treatment well             Past Medical History:  Diagnosis Date   Anxiety    High cholesterol    Hypertension     Past Surgical History:  Procedure Laterality Date   ABDOMINAL HYSTERECTOMY      There were no vitals filed for this visit.   Subjective Assessment - 09/07/20 0931     Subjective Patient reports that she has increased pain in LB and Lt hip into the Lt LE.    Currently in Pain? Yes    Pain Score 2     Pain Location Back    Pain Orientation Lower;Left;Right    Pain Descriptors / Indicators Tightness;Radiating    Pain Type Chronic pain                               [facility] Adult PT Treatment/Exercise - 09/07/20 0001       Therapeutic Activites    Therapeutic Activities Lifting    Other Therapeutic Activities dead lift from 8 inch stool 10# KB x 10 reps VC to hinge hips and keep core tight chest up      Lumbar Exercises: Stretches   Passive Hamstring Stretch Left;Right;2 reps;30 seconds    Press Ups 10 reps   2-3 sec hold exhale allowing belly to sag to increase ROM   ITB Stretch Left;Right;1 rep;30 seconds    Piriformis Stretch Left;Right;3 reps;30 seconds      Lumbar Exercises: Aerobic   Nustep L5 x 6 min UE 10      Lumbar Exercises: Standing   Row Strengthening    Theraband Level (Row) Level 3 (Green)    Row Limitations bow and arrow green TB x 10     Shoulder Extension Strengthening;Both;10 reps;Theraband    Theraband Level (Shoulder Extension) Level 3 (Green)    Other Standing Lumbar Exercises antirotation green TB x 20 each side with 3 sec hold   correcting technique to avoid rotation of trunk     Moist Heat Therapy   Number Minutes Moist Heat 10 Minutes    Moist Heat Location Hip   posterior     Electrical Stimulation   Electrical Stimulation Location 10    Electrical Stimulation Action TENS    Electrical Stimulation Parameters to tolerance    Electrical Stimulation Goals Tone;Pain      Manual Therapy   Soft tissue mobilization deep tissue work through posterior hip- piriformis and hip abductors                    PT Education - 09/07/20 0959     Education Details HEP    Person(s) Educated Patient    Methods Explanation;Demonstration;Tactile cues;Verbal cues;Handout    Comprehension Verbalized understanding;Returned demonstration;Verbal cues required;Tactile cues required  PT Long Term Goals - 08/10/20 1227       PT LONG TERM GOAL #1   Title Decrease LBP and Lt posterior hip pain by 75-100% allowing patient to return to all normal functional activities    Time 6    Period Weeks    Status New    Target Date 09/21/20      PT LONG TERM GOAL #2   Title Increase AROM lumbar spine and Lt hip to WNL's and pain free    Time 6    Period Weeks    Status New    Target Date 09/21/20      PT LONG TERM GOAL #3   Title Patient reports return to walking program without increase in pain    Time 6    Period Weeks    Status New    Target Date 09/21/20      PT LONG TERM GOAL #4   Title Independent in HEP (including aquatic therapy program as indicated)    Time 6    Period Weeks    Status New    Target Date 09/21/20      PT LONG TERM GOAL #5   Title Improve functional limitation score to 65    Time 6    Period Weeks    Status New    Target Date 09/21/20                    Plan - 09/07/20 0936     Clinical Impression Statement Patient reports that she had an increase in pain when she awoke with increased pain. She is not sure what she may have done to increase symptoms. She did her exercises yesterday and rested n supine with knees bent. Reviewed and corrected all exercises to avoid any rotation or twisting. Note continued musuclar tightness Lt posterior hip    Rehab Potential Good    PT Frequency 3x / week    PT Duration 6 weeks    PT Treatment/Interventions ADLs/Self Care Home Management;Aquatic Therapy;Cryotherapy;Electrical Stimulation;Iontophoresis '4mg'$ /ml Dexamethasone;Moist Heat;Ultrasound;Functional mobility training;Therapeutic activities;Therapeutic exercise;Balance training;Neuromuscular re-education;Patient/family education;Manual techniques;Dry needling;Taping    PT Next Visit Plan review HEP; progress with core stabilization and strengthening; manual work and assess response to DN; postural education    PT Home Exercise Plan [id number]    Consulted and Agree with Plan of Care Patient             Patient will benefit from skilled therapeutic intervention in order to improve the following deficits and impairments:     Visit Diagnosis: Acute left-sided low back pain without sciatica  Other symptoms and signs involving the musculoskeletal system     Problem List Patient Active Problem List   Diagnosis Date Noted   Lumbosacral spondylosis 05/21/2020   Coughing 01/09/2020   Herpes simplex 07/21/2019   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

## 2020-09-07 NOTE — Patient Instructions (Addendum)
Access Code: [email or web address] URL: [email or web address] Date: 09/07/2020 Prepared by: Gillermo Murdoch  Exercises Prone Press Up - 2 x daily - 7 x weekly - 1 sets - 10 reps - 2-3 sec hold Supine Piriformis Stretch with Leg Straight - 2 x daily - 7 x weekly - 1 sets - 3 reps - 30 sec hold Hooklying Hamstring Stretch with Strap - 2 x daily - 7 x weekly - 1 sets - 3 reps - 30 sec hold Supine ITB Stretch with Strap - 2 x daily - 7 x weekly - 1 sets - 3 reps - 30 sec hold Sit to Stand - 2 x daily - 7 x weekly - 1 sets - 10 reps - 3-5 sec hold Wall Quarter Squat - 2 x daily - 7 x weekly - 1-2 sets - 10 reps - 5-10 sec hold Shoulder External Rotation and Scapular Retraction with Resistance - 2 x daily - 7 x weekly - 10 reps - 3 sets Scapular Retraction with Resistance - 2 x daily - 7 x weekly - 10 reps - 3 sets Scapular Retraction with Resistance Advanced - 2 x daily - 7 x weekly - 10 reps - 3 sets Anti-Rotation Lateral Stepping with Press - 2 x daily - 7 x weekly - 1-2 sets - 10 reps - 2-3 sec hold Hooklying Isometric Clamshell - 2 x daily - 7 x weekly - 1 sets - 10 reps - 3 sec hold Sidelying Hip Abduction - 2 x daily - 7 x weekly - 2-3 sets - 10 reps - 3-5 sec hold Drawing Bow - 1 x daily - 7 x weekly - 1 sets - 10 reps - 3 sec hold

## 2020-09-10 ENCOUNTER — Other Ambulatory Visit: Payer: Self-pay

## 2020-09-10 ENCOUNTER — Encounter: Payer: Self-pay | Admitting: Rehabilitative and Restorative Service Providers"

## 2020-09-10 ENCOUNTER — Ambulatory Visit (INDEPENDENT_AMBULATORY_CARE_PROVIDER_SITE_OTHER): Payer: Medicare Other | Admitting: Rehabilitative and Restorative Service Providers"

## 2020-09-10 DIAGNOSIS — R29898 Other symptoms and signs involving the musculoskeletal system: Secondary | ICD-10-CM | POA: Diagnosis not present

## 2020-09-10 DIAGNOSIS — S6992XA Unspecified injury of left wrist, hand and finger(s), initial encounter: Secondary | ICD-10-CM | POA: Diagnosis not present

## 2020-09-10 DIAGNOSIS — M65342 Trigger finger, left ring finger: Secondary | ICD-10-CM | POA: Diagnosis not present

## 2020-09-10 DIAGNOSIS — M545 Low back pain, unspecified: Secondary | ICD-10-CM

## 2020-09-10 NOTE — Therapy (Signed)
[place], [place], [address] Phone: [phone]   Fax:  [phone]  Physical Therapy Treatment  Patient Details  Name: Tammy Weeks MRN: [id number] Date of Birth: 01-02-1956 Referring Provider (PT): Dr Dianah Field   Encounter Date: 09/10/2020   PT End of Session - 09/10/20 0759     Visit Number 8    Number of Visits 12    Date for PT Re-Evaluation 09/21/20    PT Start Time 0858    PT Stop Time 0946    PT Time Calculation (min) 48 min    Activity Tolerance Patient tolerated treatment well             Past Medical History:  Diagnosis Date   Anxiety    High cholesterol    Hypertension     Past Surgical History:  Procedure Laterality Date   ABDOMINAL HYSTERECTOMY      There were no vitals filed for this visit.   Subjective Assessment - 09/10/20 0800     Subjective Patient reports that she is better than last week. She has some pain in the Lt lateral thigh/hip area.    Currently in Pain? Yes    Pain Score 2     Pain Location Back    Pain Orientation Lower;Left;Right    Pain Descriptors / Indicators Tightness;Radiating    Pain Type Chronic pain    Pain Onset More than a month ago    Pain Frequency Intermittent                [facility] PT Assessment - 09/10/20 0001       Assessment   Medical Diagnosis Lt lumbar pain to Lt posterior hip to groin    Referring Provider (PT) Dr Dianah Field    Onset Date/Surgical Date 04/13/20    Hand Dominance Right    Next MD Visit PRN    Prior Therapy yes for LB yrs ago      Palpation   Palpation comment muscular tightness Lt > Rt; Lt lateral hip/ITB/lateral hamstrings/posterior hip - piriformis and gluts                           [facility] Adult PT Treatment/Exercise - 09/10/20 0001       Lumbar Exercises: Stretches   Passive Hamstring Stretch Left;Right;2 reps;30 seconds    ITB Stretch Left;Right;1 rep;30  seconds    Piriformis Stretch Left;Right;3 reps;30 seconds      Lumbar Exercises: Aerobic   Nustep L6 x 6 min UE 10      Moist Heat Therapy   Number Minutes Moist Heat 10 Minutes    Moist Heat Location Hip   posterior     Electrical Stimulation   Electrical Stimulation Location 10    Electrical Stimulation Action TENS    Electrical Stimulation Parameters to tolerance    Electrical Stimulation Goals Tone;Pain      Manual Therapy   Manual therapy comments skilled palpation for assessment of DN and manual work    Joint Mobilization PA mobs Lt/Rt hip    Soft tissue mobilization deep tissue work through posterior/lateral hip- ITB piriformis and hip abductors              Trigger Point Dry Needling - 09/10/20 0001     Consent Given? Yes    Education Handout Provided Previously provided    Other Dry Needling Lt    Tensor Fascia Tammy Weeks  Response Palpable increased muscle length                       PT Long Term Goals - 08/10/20 1227       PT LONG TERM GOAL #1   Title Decrease LBP and Lt posterior hip pain by 75-100% allowing patient to return to all normal functional activities    Time 6    Period Weeks    Status New    Target Date 09/21/20      PT LONG TERM GOAL #2   Title Increase AROM lumbar spine and Lt hip to WNL's and pain free    Time 6    Period Weeks    Status New    Target Date 09/21/20      PT LONG TERM GOAL #3   Title Patient reports return to walking program without increase in pain    Time 6    Period Weeks    Status New    Target Date 09/21/20      PT LONG TERM GOAL #4   Title Independent in HEP (including aquatic therapy program as indicated)    Time 6    Period Weeks    Status New    Target Date 09/21/20      PT LONG TERM GOAL #5   Title Improve functional limitation score to 65    Time 6    Period Weeks    Status New    Target Date 09/21/20                   Plan - 09/10/20 [id number]     Clinical Impression  Statement Tightness and pain in the Lt posterior lateral hip and into the upper thigh. Tender and tight to palpation through the TFL/ITB. Good response to DN and manual work as well as stretching.    Rehab Potential Good    PT Frequency 3x / week    PT Duration 6 weeks    PT Treatment/Interventions ADLs/Self Care Home Management;Aquatic Therapy;Cryotherapy;Electrical Stimulation;Iontophoresis '4mg'$ /ml Dexamethasone;Moist Heat;Ultrasound;Functional mobility training;Therapeutic activities;Therapeutic exercise;Balance training;Neuromuscular re-education;Patient/family education;Manual techniques;Dry needling;Taping    PT Next Visit Plan review HEP; progress with core stabilization and strengthening; manual work and assess response to DN Lt TFL/ITB; postural education    PT Home Exercise Plan [id number]    Consulted and Agree with Plan of Care Patient             Patient will benefit from skilled therapeutic intervention in order to improve the following deficits and impairments:     Visit Diagnosis: Acute left-sided low back pain without sciatica  Other symptoms and signs involving the musculoskeletal system     Problem List Patient Active Problem List   Diagnosis Date Noted   Lumbosacral spondylosis 05/21/2020   Coughing 01/09/2020   Herpes simplex 07/21/2019   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

## 2020-09-11 ENCOUNTER — Encounter: Payer: Self-pay | Admitting: Rehabilitative and Restorative Service Providers"

## 2020-09-11 ENCOUNTER — Ambulatory Visit (INDEPENDENT_AMBULATORY_CARE_PROVIDER_SITE_OTHER): Payer: Medicare Other | Admitting: Rehabilitative and Restorative Service Providers"

## 2020-09-11 DIAGNOSIS — M545 Low back pain, unspecified: Secondary | ICD-10-CM

## 2020-09-11 DIAGNOSIS — R29898 Other symptoms and signs involving the musculoskeletal system: Secondary | ICD-10-CM | POA: Diagnosis not present

## 2020-09-11 NOTE — Therapy (Signed)
[place], [place], [address] Phone: [phone]   Fax:  [phone]  Physical Therapy Treatment  Patient Details  Name: Tammy Weeks MRN: [id number] Date of Birth: [birth year] Referring Provider (PT): Dr Dianah Field   Encounter Date: 09/11/2020   PT End of Session - 09/11/20 0848     Visit Number 9    Number of Visits 12    Date for PT Re-Evaluation 09/21/20    PT Start Time 0846    PT Stop Time 0935    PT Time Calculation (min) 49 min    Activity Tolerance Patient tolerated treatment well             Past Medical History:  Diagnosis Date   Anxiety    High cholesterol    Hypertension     Past Surgical History:  Procedure Laterality Date   ABDOMINAL HYSTERECTOMY      There were no vitals filed for this visit.   Subjective Assessment - 09/11/20 0852     Subjective Patient reports that she is doing a lot better. She has less pain in the Lt hip area and minimal Lt LBP. She has some intermittent pain in the groin. Working on her exercises at home    Currently in Pain? Yes    Pain Score 1     Pain Orientation Left;Right;Lower    Pain Descriptors / Indicators Tightness    Pain Type Chronic pain                               [facility] Adult PT Treatment/Exercise - 09/11/20 0001       Lumbar Exercises: Stretches   Passive Hamstring Stretch Left;Right;2 reps;30 seconds    Hip Flexor Stretch Left;Right;3 reps;30 seconds    Hip Flexor Stretch Limitations seated    ITB Stretch Left;Right;1 rep;30 seconds    Piriformis Stretch Left;Right;3 reps;30 seconds      Lumbar Exercises: Aerobic   Nustep L6 x 7 min UE 10      Lumbar Exercises: Standing   Row Strengthening    Theraband Level (Row) Level 3 (Green)    Row Limitations bow and arrow x 10 reps each side green TB    Shoulder Extension Strengthening;Both;10 reps;Theraband    Theraband Level (Shoulder  Extension) Level 3 (Green)    Other Standing Lumbar Exercises antirotation red TB x 20 each side with 3 sec hold    Other Standing Lumbar Exercises side steps with green TB above knees x 46f total each side; step back with green TB 5 sec hold x 10 reps elbows 90 deg flexioin at side      Knee/Hip Exercises: Standing   Forward Step Up Right;Left;20 reps;Hand Hold: 0    Forward Step Up Limitations reaching overhead x 5 reps      Moist Heat Therapy   Number Minutes Moist Heat 10 Minutes    Moist Heat Location Hip   posterior     Electrical Stimulation   Electrical Stimulation Location 10    Electrical Stimulation Action TENS    Electrical Stimulation Parameters to tolerance    Electrical Stimulation Goals Tone;Pain                    PT Education - 09/11/20 0936     Education Details HEP; community resources    [employer] Educated Patient    Methods Explanation;Demonstration;Tactile cues;Verbal cues;Handout  Comprehension Verbalized understanding;Returned demonstration;Verbal cues required;Tactile cues required                 PT Long Term Goals - 08/10/20 1227       PT LONG TERM GOAL #1   Title Decrease LBP and Lt posterior hip pain by 75-100% allowing patient to return to all normal functional activities    Time 6    Period Weeks    Status New    Target Date 09/21/20      PT LONG TERM GOAL #2   Title Increase AROM lumbar spine and Lt hip to WNL's and pain free    Time 6    Period Weeks    Status New    Target Date 09/21/20      PT LONG TERM GOAL #3   Title Patient reports return to walking program without increase in pain    Time 6    Period Weeks    Status New    Target Date 09/21/20      PT LONG TERM GOAL #4   Title Independent in HEP (including aquatic therapy program as indicated)    Time 6    Period Weeks    Status New    Target Date 09/21/20      PT LONG TERM GOAL #5   Title Improve functional limitation score to 65    Time 6     Period Weeks    Status New    Target Date 09/21/20                   Plan - 09/11/20 0858     Clinical Impression Statement Good response to DN in Lt TFL/ITB. Some persistent tightness in that area as well as hamstrings. some tightness and discomfort in the Lt hip and SI area. Continued with core stabilization and strengthening.    Rehab Potential Good    PT Frequency 3x / week    PT Duration 6 weeks    PT Treatment/Interventions ADLs/Self Care Home Management;Aquatic Therapy;Cryotherapy;Electrical Stimulation;Iontophoresis '4mg'$ /ml Dexamethasone;Moist Heat;Ultrasound;Functional mobility training;Therapeutic activities;Therapeutic exercise;Balance training;Neuromuscular re-education;Patient/family education;Manual techniques;Dry needling;Taping    PT Next Visit Plan review HEP; progress with core stabilization and strengthening; manual work and assess response to DN Lt TFL/ITB; postural education    PT Home Exercise Plan [id number]    Consulted and Agree with Plan of Care Patient             Patient will benefit from skilled therapeutic intervention in order to improve the following deficits and impairments:     Visit Diagnosis: Acute left-sided low back pain without sciatica  Other symptoms and signs involving the musculoskeletal system     Problem List Patient Active Problem List   Diagnosis Date Noted   Lumbosacral spondylosis 05/21/2020   Coughing 01/09/2020   Herpes simplex 07/21/2019   Orbital wrinkles 08/19/2018   Primary osteoarthritis of both knees 07/13/2015   Low grade 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

## 2020-09-11 NOTE — Patient Instructions (Addendum)
  Access Code: [email or web address] URL: [email or web address] Date: 09/11/2020 Prepared by: Gillermo Murdoch  Exercises Prone Press Up - 2 x daily - 7 x weekly - 1 sets - 10 reps - 2-3 sec hold Supine Piriformis Stretch with Leg Straight - 2 x daily - 7 x weekly - 1 sets - 3 reps - 30 sec hold Hooklying Hamstring Stretch with Strap - 2 x daily - 7 x weekly - 1 sets - 3 reps - 30 sec hold Supine ITB Stretch with Strap - 2 x daily - 7 x weekly - 1 sets - 3 reps - 30 sec hold Sit to Stand - 2 x daily - 7 x weekly - 1 sets - 10 reps - 3-5 sec hold Wall Quarter Squat - 2 x daily - 7 x weekly - 1-2 sets - 10 reps - 5-10 sec hold Shoulder External Rotation and Scapular Retraction with Resistance - 2 x daily - 7 x weekly - 10 reps - 3 sets Scapular Retraction with Resistance - 2 x daily - 7 x weekly - 10 reps - 3 sets Scapular Retraction with Resistance Advanced - 2 x daily - 7 x weekly - 10 reps - 3 sets Anti-Rotation Lateral Stepping with Press - 2 x daily - 7 x weekly - 1-2 sets - 10 reps - 2-3 sec hold Hooklying Isometric Clamshell - 2 x daily - 7 x weekly - 1 sets - 10 reps - 3 sec hold Sidelying Hip Abduction - 2 x daily - 7 x weekly - 2-3 sets - 10 reps - 3-5 sec hold Drawing Bow - 1 x daily - 7 x weekly - 1 sets - 10 reps - 3 sec hold Side Stepping with Resistance at Thighs - 1 x daily - 7 x weekly   [place] [facility] Location and Psychologist, counselling description of services cost  [facility] [employer] [facility] [place], [place] [phone]  Aquatics, silver sneakers classes, yoga, exercise equipment $18/month for 55+ $25/month individual $40-$60/month family  [facility] [address] [place], [place] [phone] Transportation to appointments. Activities (stretching, dancing, Tai Chi)  Mostly free  [facility] [address] [place], [place] [phone]  [facility] Fee based  healthwise exercise program Various locations [phone]  [place] exercise free  [facility] Various locations [phone] [place], [place], [place], Table tennis, chair volleyball, [place]  free  [facility] [place]. [place], [place]  [phone] Various activities (drum exercise, yoga, line dancing, etc)  free

## 2020-09-12 ENCOUNTER — Ambulatory Visit (INDEPENDENT_AMBULATORY_CARE_PROVIDER_SITE_OTHER): Payer: Medicare Other

## 2020-09-12 ENCOUNTER — Ambulatory Visit (INDEPENDENT_AMBULATORY_CARE_PROVIDER_SITE_OTHER): Payer: Medicare Other | Admitting: Sports Medicine

## 2020-09-12 ENCOUNTER — Other Ambulatory Visit: Payer: Self-pay

## 2020-09-12 DIAGNOSIS — M1712 Unilateral primary osteoarthritis, left knee: Secondary | ICD-10-CM | POA: Diagnosis not present

## 2020-09-12 DIAGNOSIS — M170 Bilateral primary osteoarthritis of knee: Secondary | ICD-10-CM | POA: Diagnosis not present

## 2020-09-12 NOTE — Assessment & Plan Note (Signed)
Ranetta returns, she is pleasant 65 year old female with knee osteoarthritis, right knee is doing well post Orthovisc, we did Orthovisc injection #2 of 4 into the left knee today, return for Orthovisc No. 3 of 4 left knee.

## 2020-09-12 NOTE — Progress Notes (Signed)
    Procedures performed today:    Procedure: Real-time Ultrasound Guided injection of the left knee Device: Samsung HS60  Verbal informed consent obtained.  Time-out conducted.  Noted no overlying erythema, induration, or other signs of local infection.  Skin prepped in a sterile fashion.  Local anesthesia: Topical Ethyl chloride.  With sterile technique and under real time ultrasound guidance: Noted trace effusion, 30 mg/2 mL of OrthoVisc (sodium hyaluronate) in a prefilled syringe was injected easily into the knee through a 22-gauge needle. Completed without difficulty  Advised to call if fevers/chills, erythema, induration, drainage, or persistent bleeding.  Images permanently stored and available for review in PACS.  Impression: Technically successful ultrasound guided injection.  Independent interpretation of notes and tests performed by another provider:   None.  Brief History, Exam, Impression, and Recommendations:    Primary osteoarthritis of both knees Tammy Weeks returns, she is pleasant 65 year old female with knee osteoarthritis, right knee is doing well post Orthovisc, we did Orthovisc injection #2 of 4 into the left knee today, return for Orthovisc No. 3 of 4 left knee.    ___________________________________________ Gwen Her. Dianah Field, M.D., ABFM., CAQSM. Primary Care and [place] Instructor of [place] [facility] Division of Medicine

## 2020-09-14 ENCOUNTER — Other Ambulatory Visit: Payer: Self-pay

## 2020-09-14 ENCOUNTER — Encounter: Payer: Self-pay | Admitting: Physical Therapy

## 2020-09-14 ENCOUNTER — Ambulatory Visit (INDEPENDENT_AMBULATORY_CARE_PROVIDER_SITE_OTHER): Payer: Medicare Other | Admitting: Physical Therapy

## 2020-09-14 ENCOUNTER — Encounter: Payer: Medicare Other | Admitting: Rehabilitative and Restorative Service Providers"

## 2020-09-14 DIAGNOSIS — R29898 Other symptoms and signs involving the musculoskeletal system: Secondary | ICD-10-CM | POA: Diagnosis not present

## 2020-09-14 DIAGNOSIS — M545 Low back pain, unspecified: Secondary | ICD-10-CM

## 2020-09-14 NOTE — Therapy (Addendum)
[place] [id number] [place], [place], [address] Phone: [phone]   Fax:  [phone]  Physical Therapy Treatment 10th visit medicare note   Progress Note Reporting Period 08/10/20 to 09/14/20  See note below for Objective Data and Assessment of Progress/Goals.      Patient Details  Name: Tammy Weeks MRN: [id number] Date of Birth: 10/05/1955 Referring Provider (PT): Dr Dianah Field   Encounter Date: 09/14/2020   PT End of Session - 09/14/20 1152     Visit Number 10    Number of Visits 12    Date for PT Re-Evaluation 09/21/20    PT Start Time [id number]    PT Stop Time 1225    PT Time Calculation (min) 40 min    Activity Tolerance Patient tolerated treatment well    Behavior During Therapy [facility] for tasks assessed/performed             Past Medical History:  Diagnosis Date   Anxiety    High cholesterol    Hypertension     Past Surgical History:  Procedure Laterality Date   ABDOMINAL HYSTERECTOMY      There were no vitals filed for this visit.   Subjective Assessment - 09/14/20 1206     Subjective Pt reports no new changes since last visit.  DN has helped.  Pain in posterior hip remains constant.    Patient is accompained by: Family member   husband   Patient Stated Goals get rid of the LBP and Lt hip pain    Currently in Pain? Yes    Pain Score 2     Pain Location Sacrum    Pain Orientation Left    Pain Descriptors / Indicators Aching    Aggravating Factors  walking up hill, driving.    Pain Relieving Factors massage, TENS, DN             Pt seen for aquatic therapy today.  Treatment took place in water 3.25-4 ft in depth at the [employer] pool. Temp of water was 96.  Pt entered/exited the pool via stairs independently with bilat rail.  Treatment:   Without floatation assistance:   Forward and backward gait, side stepping -repeated during session.  Side step with squat.   High knee marching forward and backward.   Ai Chi postures:  soothing, enclosing, uplifting x 10 each. Core engaged.   Holding onto wall: Hip ext x 10 each leg.  Single leg press x 10 into hip flexion, x 10 into hip abdct.   Lt /Rt knee quad stretch with foot supported by noodle, then with forward lean and back hip ext for additional stretch x 5 each. Calf stretch each leg x 15 x 2   Pt requires buoyancy for support and to offload joints with strengthening exercises. Viscosity of the water is needed for resistance of strengthening; water current perturbations provides challenge to standing balance unsupported, requiring increased core activation.    PT Long Term Goals - 09/14/20 1224       PT LONG TERM GOAL #1   Title Decrease LBP and Lt posterior hip pain by 75-100% allowing patient to return to all normal functional activities    Baseline reduced by 70%    Time 6    Period Weeks    Status On-going      PT LONG TERM GOAL #2   Title Increase AROM lumbar spine and Lt hip to WNL's and pain free  Time 6    Period Weeks    Status On-going      PT LONG TERM GOAL #3   Title Patient reports return to walking program without increase in pain    Baseline Able to walk, but pain up to 3/10 afterwards.    Time 6    Period Weeks    Status On-going      PT LONG TERM GOAL #4   Title Independent in HEP (including aquatic therapy program as indicated)    Time 6    Period Weeks    Status On-going      PT LONG TERM GOAL #5   Title Improve functional limitation score to 65    Time 6    Period Weeks    Status On-going                   Plan - 09/14/20 1209     Clinical Impression Statement Pt responded well with aquatic exercises, with some reduction of pain in Lt hip during session.  Pt is reporting 70% reduction in pain from when she initiated therapy; FOTO remains unchanged from eval.  She is making gradual progress towards LTGs.    Rehab Potential Good    PT Frequency 3x  / week    PT Duration 6 weeks    PT Treatment/Interventions ADLs/Self Care Home Management;Aquatic Therapy;Cryotherapy;Electrical Stimulation;Iontophoresis '4mg'$ /ml Dexamethasone;Moist Heat;Ultrasound;Functional mobility training;Therapeutic activities;Therapeutic exercise;Balance training;Neuromuscular re-education;Patient/family education;Manual techniques;Dry needling;Taping    PT Next Visit Plan progress with core stabilization and strengthening; manual work and assess response to DN Lt TFL/ITB; postural education. establish aquatic HEP.    PT Home Exercise Plan [id number]    Consulted and Agree with Plan of Care Patient             Patient will benefit from skilled therapeutic intervention in order to improve the following deficits and impairments:  Pain, Decreased activity tolerance, Hypomobility, Impaired flexibility, Improper body mechanics, Decreased mobility, Decreased strength, Postural dysfunction  Visit Diagnosis: Acute left-sided low back pain without sciatica  Other symptoms and signs involving the musculoskeletal system     Problem List Patient Active Problem List   Diagnosis Date Noted   Lumbosacral spondylosis 05/21/2020   Coughing 01/09/2020   Herpes simplex 07/21/2019   Orbital wrinkles 08/19/2018   Primary osteoarthritis of both knees 07/13/2015   Low grade squamous intraepithelial lesion (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 Helene Kelp PT, MPH 09/18/20 9:10 AM   [facility] [place], [place], [address] Phone: [phone]   Fax:  [phone]  Name: Tammy Weeks MRN: [id number] Date of Birth: May 20, 1955

## 2020-09-18 ENCOUNTER — Ambulatory Visit (INDEPENDENT_AMBULATORY_CARE_PROVIDER_SITE_OTHER): Payer: Medicare Other | Admitting: Rehabilitative and Restorative Service Providers"

## 2020-09-18 ENCOUNTER — Ambulatory Visit (INDEPENDENT_AMBULATORY_CARE_PROVIDER_SITE_OTHER): Payer: Medicare Other

## 2020-09-18 ENCOUNTER — Other Ambulatory Visit: Payer: Self-pay

## 2020-09-18 ENCOUNTER — Ambulatory Visit (INDEPENDENT_AMBULATORY_CARE_PROVIDER_SITE_OTHER): Payer: Medicare Other | Admitting: Sports Medicine

## 2020-09-18 ENCOUNTER — Encounter: Payer: Self-pay | Admitting: Rehabilitative and Restorative Service Providers"

## 2020-09-18 DIAGNOSIS — R29898 Other symptoms and signs involving the musculoskeletal system: Secondary | ICD-10-CM

## 2020-09-18 DIAGNOSIS — M170 Bilateral primary osteoarthritis of knee: Secondary | ICD-10-CM

## 2020-09-18 DIAGNOSIS — M1712 Unilateral primary osteoarthritis, left knee: Secondary | ICD-10-CM

## 2020-09-18 DIAGNOSIS — M545 Low back pain, unspecified: Secondary | ICD-10-CM | POA: Diagnosis not present

## 2020-09-18 NOTE — Therapy (Signed)
[facility] [place], [place], [address] Phone: [phone]   Fax:  [phone]  Physical Therapy Treatment  Patient Details  Name: Tammy Weeks MRN: [id number] Date of Birth: [birth year] Referring Provider (PT): Dr Dianah Field   Encounter Date: 09/18/2020   PT End of Session - 09/18/20 0801     Visit Number 11    Number of Visits 12    Date for PT Re-Evaluation 09/21/20    PT Start Time 0759    PT Stop Time 0847    PT Time Calculation (min) 48 min    Activity Tolerance Patient tolerated treatment well             Past Medical History:  Diagnosis Date   Anxiety    High cholesterol    Hypertension     Past Surgical History:  Procedure Laterality Date   ABDOMINAL HYSTERECTOMY      There were no vitals filed for this visit.   Subjective Assessment - 09/18/20 0802     Subjective Improving gradually. Working on exercises. Did well with the pool and went to her pool Saturday. Walked and did her exercises. Some pain in the Lt hip.    Currently in Pain? Yes    Pain Score 2     Pain Location Hip    Pain Orientation Left    Pain Descriptors / Indicators Aching    Pain Type Chronic pain    Pain Radiating Towards Lt posterior hip and a "little bit" in the groin    Pain Onset More than a month ago    Pain Frequency Intermittent    Aggravating Factors  walking up hills; driving    Pain Relieving Factors massage; TENS; DN                               [facility] Adult PT Treatment/Exercise - 09/18/20 0001       Therapeutic Activites    Therapeutic Activities Lifting    Other Therapeutic Activities dead lift from 8 inch stool 15# KB x 10 reps VC to hinge hips and keep core tight chest up      Lumbar Exercises: Stretches   Passive Hamstring Stretch Left;Right;2 reps;30 seconds    Hip Flexor Stretch Left;Right;3 reps;30 seconds    Hip Flexor Stretch Limitations seated     Press Ups 10 reps   2-3 sec - exhale wth belly sag   ITB Stretch Left;Right;1 rep;30 seconds    Piriformis Stretch Left;Right;3 reps;30 seconds    Piriformis Stretch Limitations varying angles two different positions including knee to opposite shoulder 30 sec x 2 each position      Lumbar Exercises: Aerobic   Nustep L6 x 7 min UE 10      Lumbar Exercises: Standing   Row Strengthening    Theraband Level (Row) Level 4 (Blue)    Shoulder Extension Strengthening    Theraband Level (Shoulder Extension) Level 4 (Blue)    Other Standing Lumbar Exercises side steps with green TB above knees x 24f total each side      Lumbar Exercises: Supine   Bridge 5 reps;5 seconds    Bridge with clamshell 5 reps;5 seconds      Lumbar Exercises: Sidelying   Clam Right;Left;5 reps;3 seconds   green TB     Knee/Hip Exercises: Standing   Forward Step Up Right;Left;20 reps;Hand Hold: 0  Moist Heat Therapy   Number Minutes Moist Heat 10 Minutes    Moist Heat Location Hip   posterior     Electrical Stimulation   Electrical Stimulation Location 10    Electrical Stimulation Action TENS    Electrical Stimulation Parameters to tolerance    Electrical Stimulation Goals Tone;Pain      Manual Therapy   Manual therapy comments skilled palpation for assessment of DN and manual work    Joint Mobilization PA mobs Lt/Rt hip    Soft tissue mobilization deep tissue work through posterior/lateral hip- ITB piriformis and hip abductors              Trigger Point Dry Needling - 09/18/20 0001     Consent Given? Yes    Education Handout Provided Previously provided    Other Dry Needling Lt    Gluteus Medius Response Palpable increased muscle length    Gluteus Maximus Response Palpable increased muscle length    Piriformis Response Palpable increased muscle length                  PT Education - 09/18/20 0906     Education Details HEP    Person(s) Educated Patient    Methods  Explanation;Demonstration;Tactile cues;Verbal cues;Handout    Comprehension Verbalized understanding;Returned demonstration;Verbal cues required;Tactile cues required                 PT Long Term Goals - 09/14/20 1224       PT LONG TERM GOAL #1   Title Decrease LBP and Lt posterior hip pain by 75-100% allowing patient to return to all normal functional activities    Baseline reduced by 70%    Time 6    Period Weeks    Status On-going      PT LONG TERM GOAL #2   Title Increase AROM lumbar spine and Lt hip to WNL's and pain free    Time 6    Period Weeks    Status On-going      PT LONG TERM GOAL #3   Title Patient reports return to walking program without increase in pain    Baseline Able to walk, but pain up to 3/10 afterwards.    Time 6    Period Weeks    Status On-going      PT LONG TERM GOAL #4   Title Independent in HEP (including aquatic therapy program as indicated)    Time 6    Period Weeks    Status On-going      PT LONG TERM GOAL #5   Title Improve functional limitation score to 65    Time 6    Period Weeks    Status On-going                   Plan - 09/18/20 0856     Clinical Impression Statement Patient reports that she continues to make gradual progress with therapy and HEP. She has persistent tightness through the Lt lateral sacral border and into the Lt posterior hip. Added exercises and continued with DN/STM without difficulty.    Rehab Potential Good    PT Frequency 3x / week    PT Duration 6 weeks    PT Treatment/Interventions ADLs/Self Care Home Management;Aquatic Therapy;Cryotherapy;Electrical Stimulation;Iontophoresis '4mg'$ /ml Dexamethasone;Moist Heat;Ultrasound;Functional mobility training;Therapeutic activities;Therapeutic exercise;Balance training;Neuromuscular re-education;Patient/family education;Manual techniques;Dry needling;Taping    PT Next Visit Plan progress with core stabilization and strengthening; manual work and assess  response to DN Lt TFL/ITB; postural  education. establish aquatic HEP.    PT Home Exercise Plan [id number]    Consulted and Agree with Plan of Care Patient             Patient will benefit from skilled therapeutic intervention in order to improve the following deficits and impairments:     Visit Diagnosis: Acute left-sided low back pain without sciatica  Other symptoms and signs involving the musculoskeletal system     Problem List Patient Active Problem List   Diagnosis Date Noted   Lumbosacral spondylosis 05/21/2020   Coughing 01/09/2020   Herpes simplex 07/21/2019   Orbital wrinkles 08/19/2018   Primary osteoarthritis of both knees 07/13/2015   Low grade squamous intraepithelial lesion (LGSIL) on cervicovaginal cytologic smear 03/19/2011    Mashonda Broski Nilda Simmer PT, MPH  09/18/2020, 9:06 AM  [facility] [place], [place], [address] Phone: [phone]   Fax:  [phone]  Name: Tammy Weeks MRN: [id number] Date of Birth: [birth year]

## 2020-09-18 NOTE — Patient Instructions (Signed)
Access Code: [email or web address] URL: [email or web address] Date: 09/18/2020 Prepared by: Gillermo Murdoch  Exercises Prone Press Up - 2 x daily - 7 x weekly - 1 sets - 10 reps - 2-3 sec hold Supine Piriformis Stretch with Leg Straight - 2 x daily - 7 x weekly - 1 sets - 3 reps - 30 sec hold Hooklying Hamstring Stretch with Strap - 2 x daily - 7 x weekly - 1 sets - 3 reps - 30 sec hold Supine ITB Stretch with Strap - 2 x daily - 7 x weekly - 1 sets - 3 reps - 30 sec hold Sit to Stand - 2 x daily - 7 x weekly - 1 sets - 10 reps - 3-5 sec hold Wall Quarter Squat - 2 x daily - 7 x weekly - 1-2 sets - 10 reps - 5-10 sec hold Shoulder External Rotation and Scapular Retraction with Resistance - 2 x daily - 7 x weekly - 10 reps - 3 sets Scapular Retraction with Resistance - 2 x daily - 7 x weekly - 10 reps - 3 sets Scapular Retraction with Resistance Advanced - 2 x daily - 7 x weekly - 10 reps - 3 sets Anti-Rotation Lateral Stepping with Press - 2 x daily - 7 x weekly - 1-2 sets - 10 reps - 2-3 sec hold Hooklying Isometric Clamshell - 2 x daily - 7 x weekly - 1 sets - 10 reps - 3 sec hold Sidelying Hip Abduction - 2 x daily - 7 x weekly - 2-3 sets - 10 reps - 3-5 sec hold Drawing Bow - 1 x daily - 7 x weekly - 1 sets - 10 reps - 3 sec hold Side Stepping with Resistance at Thighs - 1 x daily - 7 x weekly Supine Single Knee to Chest - 2 x daily - 7 x weekly - 1 sets - 3 reps - 30 sec hold

## 2020-09-18 NOTE — Progress Notes (Signed)
    Procedures performed today:    Procedure: Real-time Ultrasound Guided injection of the left knee Device: Samsung HS60  Verbal informed consent obtained.  Time-out conducted.  Noted no overlying erythema, induration, or other signs of local infection.  Skin prepped in a sterile fashion.  Local anesthesia: Topical Ethyl chloride.  With sterile technique and under real time ultrasound guidance: Noted trace effusion, 30 mg/2 mL of OrthoVisc (sodium hyaluronate) in a prefilled syringe was injected easily into the knee through a 22-gauge needle. Completed without difficulty  Advised to call if fevers/chills, erythema, induration, drainage, or persistent bleeding.  Images permanently stored and available for review in PACS.  Impression: Technically successful ultrasound guided injection.  Independent interpretation of notes and tests performed by another provider:   None.  Brief History, Exam, Impression, and Recommendations:    Primary osteoarthritis of both knees Orthovisc injection #3 of 4 left knee, return in 1 week for #4 of 4.    ___________________________________________ Gwen Her. Dianah Field, M.D., ABFM., CAQSM. Primary Care and [place] Instructor of [place] [facility] of [employer]

## 2020-09-18 NOTE — Assessment & Plan Note (Signed)
Orthovisc injection #3 of 4 left knee, return in 1 week for #4 of 4.

## 2020-09-19 DIAGNOSIS — Z85828 Personal history of other malignant neoplasm of skin: Secondary | ICD-10-CM | POA: Diagnosis not present

## 2020-09-19 DIAGNOSIS — D3613 Benign neoplasm of peripheral nerves and autonomic nervous system of lower limb, including hip: Secondary | ICD-10-CM | POA: Diagnosis not present

## 2020-09-19 DIAGNOSIS — L738 Other specified follicular disorders: Secondary | ICD-10-CM | POA: Diagnosis not present

## 2020-09-19 DIAGNOSIS — L820 Inflamed seborrheic keratosis: Secondary | ICD-10-CM | POA: Diagnosis not present

## 2020-09-20 ENCOUNTER — Encounter: Payer: Medicare Other | Admitting: Rehabilitative and Restorative Service Providers"

## 2020-09-21 ENCOUNTER — Encounter: Payer: Medicare Other | Admitting: Rehabilitative and Restorative Service Providers"

## 2020-09-24 ENCOUNTER — Ambulatory Visit (INDEPENDENT_AMBULATORY_CARE_PROVIDER_SITE_OTHER): Payer: Medicare Other | Admitting: Rehabilitative and Restorative Service Providers"

## 2020-09-24 ENCOUNTER — Other Ambulatory Visit: Payer: Self-pay

## 2020-09-24 ENCOUNTER — Encounter: Payer: Self-pay | Admitting: Rehabilitative and Restorative Service Providers"

## 2020-09-24 DIAGNOSIS — M545 Low back pain, unspecified: Secondary | ICD-10-CM

## 2020-09-24 DIAGNOSIS — R29898 Other symptoms and signs involving the musculoskeletal system: Secondary | ICD-10-CM | POA: Diagnosis not present

## 2020-09-24 NOTE — Therapy (Signed)
[place], [place], [address] Phone: [phone]   Fax:  [phone]  Physical Therapy Treatment  Patient Details  Name: Tammy Weeks MRN: [id number] Date of Birth: 21-Aug-1955 Referring Provider (PT): Dr Dianah Field   Encounter Date: 09/24/2020   PT End of Session - 09/24/20 0844     Visit Number 12    Number of Visits 24    Date for PT Re-Evaluation 11/05/20    PT Start Time 0844    PT Stop Time 0932    PT Time Calculation (min) 48 min    Activity Tolerance Patient tolerated treatment well             Past Medical History:  Diagnosis Date   Anxiety    High cholesterol    Hypertension     Past Surgical History:  Procedure Laterality Date   ABDOMINAL HYSTERECTOMY      There were no vitals filed for this visit.   Subjective Assessment - 09/24/20 0845     Subjective Patient reports that her husband awoke last Friday with vertigo - has been in bed this weekend. Tan[place]ala reports that she walked ~ 2 miles yesterday and did well. She has done some gentle yoga and working on her exercises. Feeling some LBP not much into the groin or Lt hip.    Currently in Pain? Yes    Pain Score 1     Pain Location Back    Pain Orientation Left;Right;Lower    Pain Descriptors / Indicators Aching    Pain Type Chronic pain                [facility] PT Assessment - 09/24/20 0001       Assessment   Medical Diagnosis Lt lumbar pain to Lt posterior hip to groin    Referring Provider (PT) Dr Dianah Field    Onset Date/Surgical Date 04/13/20    Hand Dominance Right    Next MD Visit PRN    Prior Therapy yes for LB yrs ago      AROM   Lumbar Flexion 95%    Lumbar Extension 65%    Lumbar - Right Side Bend 100%    Lumbar - Left Side Bend 100%    Lumbar - Right Rotation 65%    Lumbar - Left Rotation 65%      Flexibility   Hamstrings tight Lt > RT    Quadriceps WFL's bilat    ITB tight Lt     Piriformis tight Lt    Quadratus Lumborum tight Lt                           [facility] Adult PT Treatment/Exercise - 09/24/20 0001       Lumbar Exercises: Stretches   Passive Hamstring Stretch Left;Right;2 reps;30 seconds    Hip Flexor Stretch Left;Right;3 reps;30 seconds    Hip Flexor Stretch Limitations seated    Press Ups 10 reps   2-3 sec - exhale wth belly sag   ITB Stretch Left;Right;1 rep;30 seconds    Piriformis Stretch Left;Right;3 reps;30 seconds    Piriformis Stretch Limitations varying angles two different positions including knee to opposite shoulder 30 sec x 2 each position      Lumbar Exercises: Aerobic   Nustep L6 x 6 min UE 10      Moist Heat Therapy   Number Minutes Moist Heat 10 Minutes    Moist  Heat Location Hip   posterior     Clinical cytogeneticist Action TENs    Electrical Stimulation Parameters to tolerance    Electrical Stimulation Goals Tone;Pain      Manual Therapy   Manual therapy comments skilled palpation for assessment of DN and manual work    Joint Mobilization PA mobs Lt/Rt hip    Soft tissue mobilization deep tissue work through posterior/lateral hip- ITB piriformis and hip abductors              Trigger Point Dry Needling - 09/24/20 0001     Consent Given? Yes    Education Handout Provided Previously provided    Other Dry Needling Lt    Gluteus Minimus Response Palpable increased muscle length;Twitch response elicited    Gluteus Medius Response Palpable increased muscle length    Gluteus Maximus Response Palpable increased muscle length                   PT Education - 09/24/20 1041     Education Details HEP    Person(s) Educated Patient    Methods Explanation;Demonstration;Tactile cues;Verbal cues;Handout    Comprehension Verbalized understanding;Returned demonstration;Verbal cues required;Tactile cues required                 PT  Long Term Goals - 09/24/20 1043       PT LONG TERM GOAL #1   Title Decrease LBP and Lt posterior hip pain by 75-100% allowing patient to return to all normal functional activities    Baseline -    Time 6    Period Weeks    Status On-going    Target Date 11/05/20      PT LONG TERM GOAL #2   Title Increase AROM lumbar spine and Lt hip to WNL's and pain free    Time 6    Period Weeks    Status On-going    Target Date 11/05/20      PT LONG TERM GOAL #3   Title Patient reports return to walking program without increase in pain    Baseline Able to walk, but pain up to 3/10 afterwards.    Time 6    Period Weeks    Status On-going    Target Date 11/05/20      PT LONG TERM GOAL #4   Title Independent in HEP (including aquatic therapy program as indicated)    Time 6    Period Weeks    Status On-going    Target Date 11/05/20      PT LONG TERM GOAL #5   Title Improve functional limitation score to 65    Time 6    Period Weeks    Status On-going    Target Date 11/05/20                   Plan - 09/24/20 0932     Clinical Impression Statement Patient demonstrates improvement in lumbar mobility and ROM. She continues to have tightness in the hips Lt > Rt and muscular tightness to palpation along the sacral border and into posterior Lt hip. Patient needs to progress with core strengthening and stabilization. She is progressing toward stated goals of therapy but will benefit from continued treatment to address persistent problems and achieve maximum rehab potential.    Rehab Potential Good    PT Frequency 3x / week    PT Duration 6 weeks  PT Treatment/Interventions ADLs/Self Care Home Management;Aquatic Therapy;Cryotherapy;Electrical Stimulation;Iontophoresis '4mg'$ /ml Dexamethasone;Moist Heat;Ultrasound;Functional mobility training;Therapeutic activities;Therapeutic exercise;Balance training;Neuromuscular re-education;Patient/family education;Manual techniques;Dry  needling;Taping    PT Next Visit Plan progress with core stabilization and strengthening; manual work and assess response to DN Lt TFL/ITB; postural education. establish aquatic HEP.    PT Home Exercise Plan [id number]    Consulted and Agree with Plan of Care Patient             Patient will benefit from skilled therapeutic intervention in order to improve the following deficits and impairments:     Visit Diagnosis: Acute left-sided low back pain without sciatica  Other symptoms and signs involving the musculoskeletal system     Problem List Patient Active Problem List   Diagnosis Date Noted   Lumbosacral spondylosis 05/21/2020   Coughing 01/09/2020   Herpes simplex 07/21/2019   Orbital wrinkles 08/19/2018   Primary osteoarthritis of both knees 07/13/2015   Low grade squamous intraepithelial lesion (LGSIL) on cervicovaginal cytologic smear 03/19/2011    Marivel Mcclarty Nilda Simmer, PT, MPH  09/24/2020, 10:45 AM  [facility] [place], [place], [address] Phone: [phone]   Fax:  [phone]  Name: ZARIFA STODOLA MRN: [id number] Date of Birth: March 20, 1955

## 2020-09-25 ENCOUNTER — Ambulatory Visit (INDEPENDENT_AMBULATORY_CARE_PROVIDER_SITE_OTHER): Payer: Medicare Other | Admitting: Sports Medicine

## 2020-09-25 ENCOUNTER — Ambulatory Visit (INDEPENDENT_AMBULATORY_CARE_PROVIDER_SITE_OTHER): Payer: Medicare Other

## 2020-09-25 DIAGNOSIS — M170 Bilateral primary osteoarthritis of knee: Secondary | ICD-10-CM

## 2020-09-25 DIAGNOSIS — M1712 Unilateral primary osteoarthritis, left knee: Secondary | ICD-10-CM

## 2020-09-25 DIAGNOSIS — M47897 Other spondylosis, lumbosacral region: Secondary | ICD-10-CM

## 2020-09-25 NOTE — Assessment & Plan Note (Signed)
Orthovisc No. 4 of 4 left knee, return as needed.

## 2020-09-25 NOTE — Progress Notes (Signed)
    Procedures performed today:    Procedure: Real-time Ultrasound Guided injection of the left knee Device: Samsung HS60  Verbal informed consent obtained.  Time-out conducted.  Noted no overlying erythema, induration, or other signs of local infection.  Skin prepped in a sterile fashion.  Local anesthesia: Topical Ethyl chloride.  With sterile technique and under real time ultrasound guidance: Noted trace effusion, 30 mg/2 mL of OrthoVisc (sodium hyaluronate) in a prefilled syringe was injected easily into the knee through a 22-gauge needle. Completed without difficulty  Advised to call if fevers/chills, erythema, induration, drainage, or persistent bleeding.  Images permanently stored and available for review in PACS.  Impression: Technically successful ultrasound guided injection.  Independent interpretation of notes and tests performed by another provider:   None.  Brief History, Exam, Impression, and Recommendations:    Primary osteoarthritis of both knees Orthovisc No. 4 of 4 left knee, return as needed.    ___________________________________________ Gwen Her. Dianah Field, M.D., ABFM., CAQSM. Primary Care and [place] Instructor of [place] [facility] of Medicine

## 2020-09-26 ENCOUNTER — Encounter: Payer: Medicare Other | Admitting: Rehabilitative and Restorative Service Providers"

## 2020-09-28 ENCOUNTER — Encounter: Payer: Medicare Other | Admitting: Rehabilitative and Restorative Service Providers"

## 2020-09-28 ENCOUNTER — Ambulatory Visit (INDEPENDENT_AMBULATORY_CARE_PROVIDER_SITE_OTHER): Payer: Medicare Other | Admitting: Physical Therapy

## 2020-09-28 ENCOUNTER — Other Ambulatory Visit: Payer: Self-pay

## 2020-09-28 DIAGNOSIS — M545 Low back pain, unspecified: Secondary | ICD-10-CM | POA: Diagnosis not present

## 2020-09-28 DIAGNOSIS — R29898 Other symptoms and signs involving the musculoskeletal system: Secondary | ICD-10-CM

## 2020-09-28 NOTE — Therapy (Signed)
[facility] [place], [place], [address] Phone: [phone]   Fax:  [phone]  Physical Therapy Treatment  Patient Details  Name: Tammy Weeks MRN: [id number] Date of Birth: 1955/03/29 Referring Provider (PT): Dr Dianah Field   Encounter Date: 09/28/2020   PT End of Session - 09/28/20 1243     Visit Number 13    Number of Visits 24    Date for PT Re-Evaluation 11/05/20    PT Start Time 1230    PT Stop Time 1310    PT Time Calculation (min) 40 min    Activity Tolerance Patient tolerated treatment well    Behavior During Therapy [facility] for tasks assessed/performed             Past Medical History:  Diagnosis Date   Anxiety    High cholesterol    Hypertension     Past Surgical History:  Procedure Laterality Date   ABDOMINAL HYSTERECTOMY      There were no vitals filed for this visit.   Subjective Assessment - 09/28/20 1246     Subjective "This is the best my back has felt in 5 months."  She did some lifting at the food bank so back is sore today.    Pertinent History history of LBP on an intermittent basis over the past 20 years; hysterectomy; HTN    Patient Stated Goals get rid of the LBP and Lt hip pain    Currently in Pain? Yes    Pain Score 2     Pain Orientation Lower Back   Pain Descriptors / Indicators Aching    Aggravating Factors  walking up hills, lifting    Pain Relieving Factors TENS, DN, heat.              Pt seen for aquatic therapy today.  Treatment took place in water 3.25-4 ft in depth at the [facility] pool. Temp of water was 94.  Pt entered/exited the pool via stairs independently with bilat rail.   Treatment:    Without floatation assistance:   Forward and backward gait, side stepping -repeated during session.  Side step with squat.  High knee marching forward and backward.    Ai Chi postures:  soothing, enclosing, uplifting x 10 each. Core  engaged.    Holding onto wall: Single leg press x 10 into hip flexion, x 10 into hip abdct.   Lt /Rt knee quad stretch with foot supported by noodle, then with forward lean and back hip ext for additional stretch x 5 each. Calf stretch each leg x 15 x 2.  Back against wall - single leg supported by noodle- hamstring stretch, to ITB, and adductor stretch x 3 reps each leg.    Holding onto noodle: curtsey lunges. Then repeated with knee to noodle.   Holding blue floatation dummbells - forward row x 20, spinal twist with core engaged and arms abdct 90, presses down towards sides x 15.    Pt requires buoyancy for support and to offload joints with strengthening exercises. Viscosity of the water is needed for resistance of strengthening; water current perturbations provides challenge to standing balance unsupported, requiring increased core activation.      PT Long Term Goals - 09/24/20 1043       PT LONG TERM GOAL #1   Title Decrease LBP and Lt posterior hip pain by 75-100% allowing patient to return to all normal functional activities    Baseline -  Time 6    Period Weeks    Status On-going    Target Date 11/05/20      PT LONG TERM GOAL #2   Title Increase AROM lumbar spine and Lt hip to WNL's and pain free    Time 6    Period Weeks    Status On-going    Target Date 11/05/20      PT LONG TERM GOAL #3   Title Patient reports return to walking program without increase in pain    Baseline Able to walk, but pain up to 3/10 afterwards.    Time 6    Period Weeks    Status On-going    Target Date 11/05/20      PT LONG TERM GOAL #4   Title Independent in HEP (including aquatic therapy program as indicated)    Time 6    Period Weeks    Status On-going    Target Date 11/05/20      PT LONG TERM GOAL #5   Title Improve functional limitation score to 65    Time 6    Period Weeks    Status On-going    Target Date 11/05/20                   Plan - 09/28/20 1249      Clinical Impression Statement Pt tolerating water exercises well, reporting reduction of back pain afterwards. Positive response to HEP stretches and beginning to reintroduce yoga/ walking into routine.  Progressing well towards remaining goals.    Rehab Potential Good    PT Frequency 3x / week    PT Duration 6 weeks    PT Treatment/Interventions ADLs/Self Care Home Management;Aquatic Therapy;Cryotherapy;Electrical Stimulation;Iontophoresis '4mg'$ /ml Dexamethasone;Moist Heat;Ultrasound;Functional mobility training;Therapeutic activities;Therapeutic exercise;Balance training;Neuromuscular re-education;Patient/family education;Manual techniques;Dry needling;Taping    PT Next Visit Plan progress with core stabilization and strengthening; manual work and assess response to DN Lt TFL/ITB; postural education. establish aquatic HEP.    PT Home Exercise Plan [id number]    Consulted and Agree with Plan of Care Patient             Patient will benefit from skilled therapeutic intervention in order to improve the following deficits and impairments:  Pain, Decreased activity tolerance, Hypomobility, Impaired flexibility, Improper body mechanics, Decreased mobility, Decreased strength, Postural dysfunction  Visit Diagnosis: Acute left-sided low back pain without sciatica  Other symptoms and signs involving the musculoskeletal system     Problem List Patient Active Problem List   Diagnosis Date Noted   Lumbosacral spondylosis 05/21/2020   Coughing 01/09/2020   Herpes simplex 07/21/2019   Orbital wrinkles 08/19/2018   Primary osteoarthritis of both knees 07/13/2015   Low grade squamous intraepithelial lesion (LGSIL) on cervicovaginal cytologic smear 03/19/2011    Kerin Perna, PTA 09/28/20 3:34 PM   [place], [place], [address] Phone: [phone]   Fax:  [phone]  Name: KADIATOU COSSON MRN:  [id number] Date of Birth: Jun 17, 1955

## 2020-09-29 DIAGNOSIS — Z23 Encounter for immunization: Secondary | ICD-10-CM | POA: Diagnosis not present

## 2020-10-02 ENCOUNTER — Ambulatory Visit (INDEPENDENT_AMBULATORY_CARE_PROVIDER_SITE_OTHER): Payer: Medicare Other | Admitting: Rehabilitative and Restorative Service Providers"

## 2020-10-02 ENCOUNTER — Encounter: Payer: Self-pay | Admitting: Rehabilitative and Restorative Service Providers"

## 2020-10-02 ENCOUNTER — Other Ambulatory Visit: Payer: Self-pay

## 2020-10-02 DIAGNOSIS — R29898 Other symptoms and signs involving the musculoskeletal system: Secondary | ICD-10-CM | POA: Diagnosis not present

## 2020-10-02 DIAGNOSIS — M545 Low back pain, unspecified: Secondary | ICD-10-CM | POA: Diagnosis not present

## 2020-10-02 NOTE — Therapy (Signed)
[place], [place], [address] Phone: [phone]   Fax:  [phone]  Physical Therapy Treatment  Patient Details  Name: Tammy Weeks MRN: [id number] Date of Birth: [birth year] Referring Provider (PT): Dr Dianah Field   Encounter Date: 10/02/2020   PT End of Session - 10/02/20 0934     Visit Number 14    Number of Visits 24    Date for PT Re-Evaluation 11/05/20    PT Start Time 0931    PT Stop Time 1016    PT Time Calculation (min) 45 min    Activity Tolerance Patient tolerated treatment well             Past Medical History:  Diagnosis Date   Anxiety    High cholesterol    Hypertension     Past Surgical History:  Procedure Laterality Date   ABDOMINAL HYSTERECTOMY      There were no vitals filed for this visit.   Subjective Assessment - 10/02/20 0935     Subjective Did great in the pool. Felt good this weekend. Walked in the community and felt good. Did a gentle yoga class yesterday and has some soreness in the Lt hip.    Currently in Pain? Yes    Pain Score 1     Pain Location Back    Pain Orientation Lower    Pain Descriptors / Indicators Aching    Pain Type Chronic pain                [facility] PT Assessment - 10/02/20 0001       Assessment   Medical Diagnosis Lt lumbar pain to Lt posterior hip to groin    Referring Provider (PT) Dr Dianah Field    Onset Date/Surgical Date 04/13/20    Hand Dominance Right    Next MD Visit PRN    Prior Therapy yes for LB yrs ago      Palpation   SI assessment  tight/tender Lt    Palpation comment muscular tightness Lt > Rt; Lt lateral hip/ITB/lateral hamstrings/posterior hip - piriformis and gluts                           [facility] Adult PT Treatment/Exercise - 10/02/20 0001       Lumbar Exercises: Stretches   Passive Hamstring Stretch Left;Right;2 reps;30 seconds    Hip Flexor Stretch Left;Right;3 reps;30  seconds    Hip Flexor Stretch Limitations seated    Press Ups 10 reps   2-3 sec - exhale wth belly sag     Lumbar Exercises: Aerobic   Nustep L6 x 6 min UE 10      Moist Heat Therapy   Number Minutes Moist Heat 10 Minutes    Moist Heat Location Hip   posterior     Electrical Stimulation   Electrical Stimulation Location 10    Electrical Stimulation Action TENS    Electrical Stimulation Parameters to tolerance    Electrical Stimulation Goals Tone;Pain      Manual Therapy   Manual therapy comments skilled palpation for assessment of DN and manual work    Joint Mobilization PA mobs Lt/Rt hip    Soft tissue mobilization deep tissue work through posterior/lateral hip- ITB piriformis and hip abductors              Trigger Point Dry Needling - 10/02/20 0001     Consent Given? Yes  Education Handout Provided Previously provided    Printmaker Performed with Dry Needling Yes    Other Dry Needling Lt    Gluteus Minimus Response Palpable increased muscle length;Twitch response elicited    Gluteus Medius Response Palpable increased muscle length    Gluteus Maximus Response Palpable increased muscle length    Tensor Fascia Lata Response Palpable increased muscle length;Twitch response elicited                        PT Long Term Goals - 09/24/20 1043       PT LONG TERM GOAL #1   Title Decrease LBP and Lt posterior hip pain by 75-100% allowing patient to return to all normal functional activities    Baseline -    Time 6    Period Weeks    Status On-going    Target Date 11/05/20      PT LONG TERM GOAL #2   Title Increase AROM lumbar spine and Lt hip to WNL's and pain free    Time 6    Period Weeks    Status On-going    Target Date 11/05/20      PT LONG TERM GOAL #3   Title Patient reports return to walking program without increase in pain    Baseline Able to walk, but pain up to 3/10 afterwards.    Time 6    Period Weeks    Status On-going     Target Date 11/05/20      PT LONG TERM GOAL #4   Title Independent in HEP (including aquatic therapy program as indicated)    Time 6    Period Weeks    Status On-going    Target Date 11/05/20      PT LONG TERM GOAL #5   Title Improve functional limitation score to 65    Time 6    Period Weeks    Status On-going    Target Date 11/05/20                   Plan - 10/02/20 1012     Clinical Impression Statement Good response to aquatic therapy with decreased pain. Patient is increasing activity level at home. Working on strengthening and stretching at home. Patient has continued muscular tightness Lt posterior hip to lateral hip/ITB. Good response to DN and manual work.    Rehab Potential Good    PT Frequency 3x / week    PT Duration 6 weeks    PT Treatment/Interventions ADLs/Self Care Home Management;Aquatic Therapy;Cryotherapy;Electrical Stimulation;Iontophoresis 4mg /ml Dexamethasone;Moist Heat;Ultrasound;Functional mobility training;Therapeutic activities;Therapeutic exercise;Balance training;Neuromuscular re-education;Patient/family education;Manual techniques;Dry needling;Taping    PT Next Visit Plan progress with core stabilization and strengthening; manual work and assess response to DN Lt TFL/ITB; postural education. establish aquatic HEP.    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

## 2020-10-08 ENCOUNTER — Ambulatory Visit (INDEPENDENT_AMBULATORY_CARE_PROVIDER_SITE_OTHER): Payer: Medicare Other | Admitting: Rehabilitative and Restorative Service Providers"

## 2020-10-08 ENCOUNTER — Other Ambulatory Visit: Payer: Self-pay

## 2020-10-08 ENCOUNTER — Encounter: Payer: Self-pay | Admitting: Rehabilitative and Restorative Service Providers"

## 2020-10-08 DIAGNOSIS — R29898 Other symptoms and signs involving the musculoskeletal system: Secondary | ICD-10-CM | POA: Diagnosis not present

## 2020-10-08 DIAGNOSIS — M545 Low back pain, unspecified: Secondary | ICD-10-CM | POA: Diagnosis not present

## 2020-10-08 NOTE — Patient Instructions (Signed)
Access Code: [email or web address] URL: [email or web address] Date: 10/08/2020 Prepared by: Gillermo Murdoch  Exercises Prone Press Up - 2 x daily - 7 x weekly - 1 sets - 10 reps - 2-3 sec hold Supine Piriformis Stretch with Leg Straight - 2 x daily - 7 x weekly - 1 sets - 3 reps - 30 sec hold Hooklying Hamstring Stretch with Strap - 2 x daily - 7 x weekly - 1 sets - 3 reps - 30 sec hold Supine ITB Stretch with Strap - 2 x daily - 7 x weekly - 1 sets - 3 reps - 30 sec hold Sit to Stand - 2 x daily - 7 x weekly - 1 sets - 10 reps - 3-5 sec hold Wall Quarter Squat - 2 x daily - 7 x weekly - 1-2 sets - 10 reps - 5-10 sec hold Shoulder External Rotation and Scapular Retraction with Resistance - 2 x daily - 7 x weekly - 10 reps - 3 sets Scapular Retraction with Resistance - 2 x daily - 7 x weekly - 10 reps - 3 sets Scapular Retraction with Resistance Advanced - 2 x daily - 7 x weekly - 10 reps - 3 sets Anti-Rotation Lateral Stepping with Press - 2 x daily - 7 x weekly - 1-2 sets - 10 reps - 2-3 sec hold Hooklying Isometric Clamshell - 2 x daily - 7 x weekly - 1 sets - 10 reps - 3 sec hold Drawing Bow - 1 x daily - 7 x weekly - 1 sets - 10 reps - 3 sec hold Side Stepping with Resistance at Thighs - 1 x daily - 7 x weekly Supine Single Knee to Chest - 2 x daily - 7 x weekly - 1 sets - 3 reps - 30 sec hold Sidelying Hip Abduction - 2 x daily - 7 x weekly - 2-3 sets - 10 reps - 3-5 sec hold Kettlebell Deadlift - 2 x daily - 7 x weekly - 1-2 sets - 10 reps - 2-3 sec hold

## 2020-10-08 NOTE — Therapy (Signed)
[place], [place], [address] Phone: [phone]   Fax:  [phone]  Physical Therapy Treatment  Patient Details  Name: Tammy Weeks MRN: [id number] Date of Birth: 17-Aug-1955 Referring Provider (PT): Dr Dianah Field   Encounter Date: 10/08/2020   PT End of Session - 10/08/20 0933     Visit Number 15    Number of Visits 24    Date for PT Re-Evaluation 11/05/20    PT Start Time 0930    PT Stop Time 1018    PT Time Calculation (min) 48 min    Activity Tolerance Patient tolerated treatment well             Past Medical History:  Diagnosis Date   Anxiety    High cholesterol    Hypertension     Past Surgical History:  Procedure Laterality Date   ABDOMINAL HYSTERECTOMY      There were no vitals filed for this visit.   Subjective Assessment - 10/08/20 0933     Subjective Patient reports that she went to a wedding this past weekend and did some dancing. Sore today.    Currently in Pain? Yes    Pain Score 2     Pain Location Back    Pain Orientation Left;Lower                [facility] PT Assessment - 10/08/20 0001       Assessment   Medical Diagnosis Lt lumbar pain to Lt posterior hip to groin    Referring Provider (PT) Dr Dianah Field    Onset Date/Surgical Date 04/13/20    Hand Dominance Right    Next MD Visit PRN    Prior Therapy yes for LB yrs ago      Palpation   SI assessment  tight/tender Lt    Palpation comment muscular tightness Lt > Rt; Lt lateral hip/ITB/lateral hamstrings/posterior hip - piriformis and gluts                           [facility] Adult PT Treatment/Exercise - 10/08/20 0001       Therapeutic Activites    Other Therapeutic Activities dead lift from 8 inch stool 15# KB x 20 reps VC to hinge hips and keep core tight chest up      Lumbar Exercises: Stretches   Piriformis Stretch Left;Right;3 reps;30 seconds    Piriformis Stretch  Limitations varying angles two different positions including knee to opposite shoulder 30 sec x 2 each position      Lumbar Exercises: Aerobic   Nustep L7 x 6 min UE 10      Lumbar Exercises: Standing   Other Standing Lumbar Exercises antirotation red TB x 20 each side with 3 sec hold    Other Standing Lumbar Exercises side steps with green TB above knees x 23ft total each side   HEP     Moist Heat Therapy   Number Minutes Moist Heat 10 Minutes    Moist Heat Location Hip   posterior     Electrical Stimulation   Electrical Stimulation Location 10    Electrical Stimulation Action TENS    Electrical Stimulation Parameters to tolerance    Electrical Stimulation Goals Tone;Pain      Manual Therapy   Manual therapy comments skilled palpation for assessment of DN and manual work    Joint Mobilization PA mobs Lt/Rt hip    Soft tissue  mobilization deep tissue work through posterior/lateral hip- ITB piriformis and hip abductors              Trigger Point Dry Needling - 10/08/20 0001     Consent Given? Yes    Education Handout Provided Previously provided    Printmaker Performed with Dry Needling Yes    Other Dry Needling Lt/Rt    Gluteus Minimus Response Palpable increased muscle length;Twitch response elicited    Gluteus Medius Response Palpable increased muscle length    Gluteus Maximus Response Palpable increased muscle length    Piriformis Response Palpable increased muscle length                   PT Education - 10/08/20 1017     Education Details HEP    Person(s) Educated Patient    Methods Explanation;Tactile cues;Demonstration;Verbal cues;Handout    Comprehension Verbalized understanding;Returned demonstration;Verbal cues required;Tactile cues required                 PT Long Term Goals - 09/24/20 1043       PT LONG TERM GOAL #1   Title Decrease LBP and Lt posterior hip pain by 75-100% allowing patient to return to all normal  functional activities    Baseline -    Time 6    Period Weeks    Status On-going    Target Date 11/05/20      PT LONG TERM GOAL #2   Title Increase AROM lumbar spine and Lt hip to WNL's and pain free    Time 6    Period Weeks    Status On-going    Target Date 11/05/20      PT LONG TERM GOAL #3   Title Patient reports return to walking program without increase in pain    Baseline Able to walk, but pain up to 3/10 afterwards.    Time 6    Period Weeks    Status On-going    Target Date 11/05/20      PT LONG TERM GOAL #4   Title Independent in HEP (including aquatic therapy program as indicated)    Time 6    Period Weeks    Status On-going    Target Date 11/05/20      PT LONG TERM GOAL #5   Title Improve functional limitation score to 65    Time 6    Period Weeks    Status On-going    Target Date 11/05/20                   Plan - 10/08/20 0941     Clinical Impression Statement Increased discomfort following dancing for a wedding over the weekend. Continues tightness Lt posterior hip and bilat SI area. Added sidelying hip abduction. Working on lifting with kettlebell.    Rehab Potential Good    PT Frequency 3x / week    PT Duration 6 weeks    PT Treatment/Interventions ADLs/Self Care Home Management;Aquatic Therapy;Cryotherapy;Electrical Stimulation;Iontophoresis 4mg /ml Dexamethasone;Moist Heat;Ultrasound;Functional mobility training;Therapeutic activities;Therapeutic exercise;Balance training;Neuromuscular re-education;Patient/family education;Manual techniques;Dry needling;Taping    PT Next Visit Plan progress with core stabilization and strengthening; manual work and assess response to DN Lt TFL/ITB; postural education. establish aquatic HEP.    PT Home Exercise Plan [id number]    Consulted and Agree with Plan of Care Patient             Patient will benefit from skilled therapeutic intervention in order to improve the following deficits  and impairments:      Visit Diagnosis: Acute left-sided low back pain without sciatica  Other symptoms and signs involving the musculoskeletal system     Problem List Patient Active Problem List   Diagnosis Date Noted   Lumbosacral spondylosis 05/21/2020   Coughing 01/09/2020   Herpes simplex 07/21/2019   Orbital wrinkles 08/19/2018   Primary osteoarthritis of both knees 07/13/2015   Low grade squamous intraepithelial lesion (LGSIL) on cervicovaginal cytologic smear 03/19/2011    Taisley Mordan Nilda Simmer, PT, MPH  10/08/2020, 10:18 AM  [facility] [place], [place], [address] Phone: [phone]   Fax:  [phone]  Name: Tammy Weeks MRN: [id number] Date of Birth: [birth year]

## 2020-10-09 ENCOUNTER — Encounter: Payer: Medicare Other | Admitting: Rehabilitative and Restorative Service Providers"

## 2020-10-09 DIAGNOSIS — Z8249 Family history of ischemic heart disease and other diseases of the circulatory system: Secondary | ICD-10-CM | POA: Diagnosis not present

## 2020-10-09 DIAGNOSIS — R3915 Urgency of urination: Secondary | ICD-10-CM | POA: Diagnosis not present

## 2020-10-09 DIAGNOSIS — R32 Unspecified urinary incontinence: Secondary | ICD-10-CM | POA: Diagnosis not present

## 2020-10-09 DIAGNOSIS — E782 Mixed hyperlipidemia: Secondary | ICD-10-CM | POA: Diagnosis not present

## 2020-10-10 ENCOUNTER — Other Ambulatory Visit: Payer: Self-pay | Admitting: Internal Medicine

## 2020-10-10 ENCOUNTER — Encounter: Payer: Medicare Other | Admitting: Rehabilitative and Restorative Service Providers"

## 2020-10-10 DIAGNOSIS — Z8249 Family history of ischemic heart disease and other diseases of the circulatory system: Secondary | ICD-10-CM

## 2020-10-15 ENCOUNTER — Encounter (INDEPENDENT_AMBULATORY_CARE_PROVIDER_SITE_OTHER): Payer: Medicare Other

## 2020-10-15 DIAGNOSIS — M47897 Other spondylosis, lumbosacral region: Secondary | ICD-10-CM | POA: Diagnosis not present

## 2020-10-16 ENCOUNTER — Encounter: Payer: Medicare Other | Admitting: Rehabilitative and Restorative Service Providers"

## 2020-10-16 NOTE — Telephone Encounter (Signed)
I spent 5 total minutes of online digital evaluation and management services. 

## 2020-10-19 ENCOUNTER — Encounter: Payer: Self-pay | Admitting: Physical Therapy

## 2020-10-19 ENCOUNTER — Ambulatory Visit (INDEPENDENT_AMBULATORY_CARE_PROVIDER_SITE_OTHER): Payer: Medicare Other | Admitting: Physical Therapy

## 2020-10-19 ENCOUNTER — Other Ambulatory Visit: Payer: Self-pay

## 2020-10-19 DIAGNOSIS — R29898 Other symptoms and signs involving the musculoskeletal system: Secondary | ICD-10-CM | POA: Diagnosis not present

## 2020-10-19 DIAGNOSIS — M545 Low back pain, unspecified: Secondary | ICD-10-CM | POA: Diagnosis not present

## 2020-10-19 NOTE — Therapy (Signed)
[place], [place], [address] Phone: [phone]   Fax:  [phone]  Physical Therapy Treatment  Patient Details  Name: Tammy Weeks MRN: [id number] Date of Birth: 1955/05/09 Referring Provider (PT): Dr Dianah Field   Encounter Date: 10/19/2020   PT End of Session - 10/19/20 1325     Visit Number 16    Number of Visits 24    Date for PT Re-Evaluation 11/05/20    PT Start Time 1306    PT Stop Time 1355    PT Time Calculation (min) 49 min    Activity Tolerance Patient tolerated treatment well    Behavior During Therapy [facility] for tasks assessed/performed             Past Medical History:  Diagnosis Date   Anxiety    High cholesterol    Hypertension     Past Surgical History:  Procedure Laterality Date   ABDOMINAL HYSTERECTOMY      There were no vitals filed for this visit.   Subjective Assessment - 10/19/20 1316     Subjective Pt reports that she continues to have soreness in back "like a constant clothespin pinching". She is scheduled for a facet injection on Monday.  She has been exercising in pool 2x/wk and has been completing therapy HEP.    Pertinent History history of LBP on an intermittent basis over the past 20 years; hysterectomy; HTN    Currently in Pain? Yes    Pain Score 2     Pain Location Back    Pain Orientation Left;Lower    Pain Descriptors / Indicators Aching;Dull    Aggravating Factors  walking up hills    Pain Relieving Factors TENS, DN, heat.              [facility] PT Assessment - 10/19/20 0001       Assessment   Medical Diagnosis Lt lumbar pain to Lt posterior hip to groin    Referring Provider (PT) Dr Dianah Field    Onset Date/Surgical Date 04/13/20    Hand Dominance Right    Next MD Visit PRN    Prior Therapy yes for LB yrs ago      Observation/Other Assessments   Focus on Therapeutic Outcomes (FOTO)  functional score 67      AROM   Lumbar  Flexion WNL    Lumbar Extension WNL   with a Pinch   Lumbar - Right Rotation 85%    Lumbar - Left Rotation 85%   with a pinch feeling               Pt seen for aquatic therapy today.  Treatment took place in water 3.25-4 ft in depth at the [employer] pool. Temp of water was 94.  Pt entered/exited the pool via stairs independently with bilat rail.   Treatment:    Without floatation assistance:   Forward and backward gait, side stepping -repeated during session.  Side step with squat.  High knee marching forward and backward. Monster walk. Trial of forward jogging - stopped due to increased pain.    Holding onto wall: Single leg press x 10 into hip flexion, x 10 into hip abdct, pushing yellow noodle under foot.   Lt /Rt knee quad stretch with foot supported by noodle, then with forward lean and back hip ext for additional stretch x 10 each. Heel raises.  Calf stretch each leg x 15 x 2.  Back against wall -  single leg supported by noodle- hamstring stretch, to ITB, and adductor stretch x 3 reps each leg.    Holding onto noodle: curtsey lunges. Then repeated with knee to noodle.    Holding blue floatation dummbells - forward row x 10, then rows on angle x 10 , presses down towards sides x 15. squat pushing dumbbell under water x 15.      Pt requires buoyancy for support and to offload joints with strengthening exercises. Viscosity of the water is needed for resistance of strengthening; water current perturbations provides challenge to standing balance unsupported, requiring increased core activation.    PT Long Term Goals - 10/19/20 1523       PT LONG TERM GOAL #1   Title Decrease LBP and Lt posterior hip pain by 75-100% allowing patient to return to all normal functional activities    Baseline -    Time 6    Period Weeks    Status Partially Met      PT LONG TERM GOAL #2   Title Increase AROM lumbar spine and Lt hip to WNL's and pain free    Time 6    Period Weeks     Status Partially Met      PT LONG TERM GOAL #3   Title Patient reports return to walking program without increase in pain    Baseline Able to walk, but pain up to 3/10 afterwards.    Time 6    Period Weeks    Status Partially Met      PT LONG TERM GOAL #4   Title Independent in HEP (including aquatic therapy program as indicated)    Time 6    Period Weeks    Status Partially Met      PT LONG TERM GOAL #5   Title Improve functional limitation score to 65    Time 6    Period Weeks    Status Achieved                   Plan - 10/19/20 1521     Clinical Impression Statement Pt tolerates aquatic exercises well, with less discomfort across low back/ posterior hip while moving in the water.  FOTO score has improved; has met LTG#5.  Lumbar ROM has improved since eval.  Some tightness noted in lumbar rotation, with "pinching feeling".  Pt has partially met her goals.    Rehab Potential Good    PT Frequency 3x / week    PT Duration 6 weeks    PT Treatment/Interventions ADLs/Self Care Home Management;Aquatic Therapy;Cryotherapy;Electrical Stimulation;Iontophoresis 67m/ml Dexamethasone;Moist Heat;Ultrasound;Functional mobility training;Therapeutic activities;Therapeutic exercise;Balance training;Neuromuscular re-education;Patient/family education;Manual techniques;Dry needling;Taping    PT Next Visit Plan progress core stabilization and strengthening; manual work and DN Lt TFL/ITB.    PT Home Exercise Plan [id number]    Consulted and Agree with Plan of Care Patient             Patient will benefit from skilled therapeutic intervention in order to improve the following deficits and impairments:  Pain, Decreased activity tolerance, Hypomobility, Impaired flexibility, Improper body mechanics, Decreased mobility, Decreased strength, Postural dysfunction  Visit Diagnosis: Acute left-sided low back pain without sciatica  Other symptoms and signs involving the musculoskeletal  system     Problem List Patient Active Problem List   Diagnosis Date Noted   Lumbosacral spondylosis 05/21/2020   Coughing 01/09/2020   Herpes simplex 07/21/2019   Orbital wrinkles 08/19/2018   Primary osteoarthritis of both knees 07/13/2015  Low grade squamous intraepithelial lesion (LGSIL) on cervicovaginal cytologic smear 03/19/2011    Kerin Perna, PTA 10/19/20 3:25 PM   [facility] [place], [place], [address] Phone: [phone]   Fax:  [phone]  Name: Tammy Weeks MRN: [id number] Date of Birth: 1955-07-26

## 2020-10-22 ENCOUNTER — Ambulatory Visit
Admission: RE | Admit: 2020-10-22 | Discharge: 2020-10-22 | Disposition: A | Discharge status: Home / Self Care | Payer: Medicare Other | Source: Ambulatory Visit | Attending: Sports Medicine | Admitting: Sports Medicine

## 2020-10-22 ENCOUNTER — Other Ambulatory Visit: Payer: Self-pay

## 2020-10-22 DIAGNOSIS — M47897 Other spondylosis, lumbosacral region: Secondary | ICD-10-CM

## 2020-10-22 DIAGNOSIS — M545 Low back pain, unspecified: Secondary | ICD-10-CM | POA: Diagnosis not present

## 2020-10-22 MED ORDER — IOPAMIDOL (ISOVUE-M 200) INJECTION 41%
1.0000 mL | Freq: Once | INTRAMUSCULAR | Status: AC
  Administered 2020-10-22: 1 mL via INTRA_ARTICULAR

## 2020-10-22 MED ORDER — METHYLPREDNISOLONE ACETATE 40 MG/ML INJ SUSP (RADIOLOG
80.0000 mg | Freq: Once | INTRAMUSCULAR | Status: AC
  Administered 2020-10-22: 80 mg via INTRA_ARTICULAR

## 2020-10-22 NOTE — Discharge Instructions (Signed)

## 2020-10-23 ENCOUNTER — Encounter: Payer: Medicare Other | Admitting: Rehabilitative and Restorative Service Providers"

## 2020-10-23 DIAGNOSIS — Z20822 Contact with and (suspected) exposure to covid-19: Secondary | ICD-10-CM | POA: Diagnosis not present

## 2020-10-26 ENCOUNTER — Encounter: Payer: Medicare Other | Admitting: Rehabilitative and Restorative Service Providers"

## 2020-10-26 ENCOUNTER — Encounter: Payer: Medicare Other | Admitting: Physical Therapy

## 2020-10-30 ENCOUNTER — Ambulatory Visit
Admission: RE | Admit: 2020-10-30 | Discharge: 2020-10-30 | Disposition: A | Discharge status: Home / Self Care | Payer: No Typology Code available for payment source | Source: Ambulatory Visit | Attending: Internal Medicine | Admitting: Internal Medicine

## 2020-10-30 DIAGNOSIS — Z8249 Family history of ischemic heart disease and other diseases of the circulatory system: Secondary | ICD-10-CM

## 2020-11-09 DIAGNOSIS — Z23 Encounter for immunization: Secondary | ICD-10-CM | POA: Diagnosis not present

## 2020-11-09 DIAGNOSIS — Z0100 Encounter for examination of eyes and vision without abnormal findings: Secondary | ICD-10-CM | POA: Diagnosis not present

## 2020-11-09 DIAGNOSIS — H26493 Other secondary cataract, bilateral: Secondary | ICD-10-CM | POA: Diagnosis not present

## 2020-11-09 DIAGNOSIS — M47897 Other spondylosis, lumbosacral region: Secondary | ICD-10-CM

## 2020-11-14 ENCOUNTER — Other Ambulatory Visit: Payer: Self-pay

## 2020-11-14 ENCOUNTER — Ambulatory Visit (INDEPENDENT_AMBULATORY_CARE_PROVIDER_SITE_OTHER): Payer: Medicare Other | Admitting: Rehabilitative and Restorative Service Providers"

## 2020-11-14 ENCOUNTER — Encounter: Payer: Self-pay | Admitting: Rehabilitative and Restorative Service Providers"

## 2020-11-14 DIAGNOSIS — R29898 Other symptoms and signs involving the musculoskeletal system: Secondary | ICD-10-CM

## 2020-11-14 DIAGNOSIS — M545 Low back pain, unspecified: Secondary | ICD-10-CM | POA: Diagnosis not present

## 2020-11-14 NOTE — Therapy (Addendum)
[facility] [place], [place], [address] Phone: [phone]   Fax:  [phone]  Physical Therapy Treatment and discharge summary  PHYSICAL THERAPY DISCHARGE SUMMARY  Visits from Start of Care: 17  Current functional level related to goals / functional outcomes: See progress note for discharge status   Remaining deficits: Continued intermittent pain    Education / Equipment: HEP  Patient agrees to discharge. Patient goals were partially met. Patient is being discharged due to being pleased with the current functional level.    [name] P. Helene Kelp PT, MPH 12/18/20 12:01 PM  Patient Details  Name: Tammy Weeks MRN: [id number] Date of Birth: 30-Jan-1955 Referring Provider (PT): Dr Dianah Field   Encounter Date: 11/14/2020   PT End of Session - 11/14/20 0931     Visit Number 17    Number of Visits 24    Date for PT Re-Evaluation 12/26/20    PT Start Time 0930    PT Stop Time 1018    PT Time Calculation (min) 48 min    Activity Tolerance Patient tolerated treatment well             Past Medical History:  Diagnosis Date   Anxiety    High cholesterol    Hypertension     Past Surgical History:  Procedure Laterality Date   ABDOMINAL HYSTERECTOMY      There were no vitals filed for this visit.   Subjective Assessment - 11/14/20 0932     Subjective Patient reports that she has some LBP more on the Lt side than Rt side. She is doing exercises on a regular basis and walking and doing water exercises on a regular basis.    Currently in Pain? Yes    Pain Score 1     Pain Location Back    Pain Orientation Left;Right;Lower    Pain Descriptors / Indicators Aching;Dull    Pain Type Chronic pain    Pain Onset More than a month ago    Pain Frequency Intermittent    Aggravating Factors  driving distances; weather    Pain Relieving Factors TENS; DN; heat; massage                [facility] PT  Assessment - 11/14/20 0001       Assessment   Medical Diagnosis Lt lumbar pain to Lt posterior hip to groin    Referring Provider (PT) Dr Dianah Field    Onset Date/Surgical Date 04/13/20    Hand Dominance Right    Next MD Visit PRN    Prior Therapy yes for LB yrs ago      AROM   Lumbar Flexion WNL    Lumbar Extension WNL   with a Pinch   Lumbar - Right Side Bend 100%    Lumbar - Left Side Bend 100%    Lumbar - Right Rotation 85% tightness in the Lt LB    Lumbar - Left Rotation 85%      Strength   Overall Strength Comments functional bilat LE strength      Flexibility   Hamstrings tight Lt > RT    Quadriceps WFL's bilat    ITB tight Lt    Piriformis tight Lt    Quadratus Lumborum tight Lt      Palpation   Palpation comment muscular tightness Lt > Rt; Lt lateral hip/ITB/lateral hamstrings/posterior hip - piriformis and gluts  [facility] Treatment/Exercise - 11/14/20 0001       Exercises   Exercises --   review exercises and activities     Lumbar Exercises: Stretches   Passive Hamstring Stretch Left;Right;2 reps;30 seconds    Hip Flexor Stretch Left;Right;3 reps;30 seconds    Hip Flexor Stretch Limitations seated    Press Ups 10 reps   exhale with belly sag   Piriformis Stretch Left;Right;3 reps;30 seconds    Piriformis Stretch Limitations varying angles two different positions including knee to opposite shoulder 30 sec x 2 each position      Moist Heat Therapy   Number Minutes Moist Heat 10 Minutes    Moist Heat Location Hip   posterior     Electrical Stimulation   Electrical Stimulation Location bilat SI/bilat posterior hips    Electrical Stimulation Action TENS    Electrical Stimulation Parameters to tolerance    Electrical Stimulation Goals Tone;Pain      Manual Therapy   Manual therapy comments skilled palpation for assessment of DN and manual work    Joint Mobilization PA mobs Lt/Rt hip    Soft tissue  mobilization deep tissue work through posterior/lateral hip- ITB piriformis and hip abductors              Trigger Point Dry Needling - 11/14/20 0001     Consent Given? Yes    Education Handout Provided Previously provided    Printmaker Performed with Dry Needling Yes    E-stim with Dry Needling Details per protocol    Other Dry Needling Lt/Rt    Gluteus Minimus Response Palpable increased muscle length;Twitch response elicited    Gluteus Medius Response Palpable increased muscle length    Gluteus Maximus Response Palpable increased muscle length    Piriformis Response Palpable increased muscle length    Erector spinae Response Palpable increased muscle length                        PT Long Term Goals - 11/14/20 1016       PT LONG TERM GOAL #1   Title Decrease LBP and Lt posterior hip pain by 75-100% allowing patient to return to all normal functional activities    Time 6    Period Weeks    Status Partially Met    Target Date 12/26/20      PT LONG TERM GOAL #2   Title Increase AROM lumbar spine and Lt hip to WNL's and pain free    Time 6    Period Weeks    Status On-going    Target Date 12/26/20      PT LONG TERM GOAL #3   Title Patient reports return to walking program without increase in pain    Baseline Able to walk, but pain up to 3/10 afterwards.    Time 6    Period Weeks    Status Partially Met    Target Date 12/26/20      PT LONG TERM GOAL #4   Title Independent in HEP (including aquatic therapy program as indicated)    Time 6    Period Weeks    Status Partially Met    Target Date 12/26/20      PT LONG TERM GOAL #5   Title Improve functional limitation score to 65    Time 6    Period Weeks    Status Achieved  Plan - 11/14/20 0956     Clinical Impression Statement Patient reports that she will have cataract surgery next week. She has increased LBP; persistent muscular tightness through the LB  and bilat posterior hips. Patient is working toward stated goals of therapy. She will benefit from continued therapy to accomplish goals.    Rehab Potential Good    PT Frequency 1x / week    PT Duration 6 weeks    PT Treatment/Interventions ADLs/Self Care Home Management;Aquatic Therapy;Cryotherapy;Electrical Stimulation;Iontophoresis 59m/ml Dexamethasone;Moist Heat;Ultrasound;Functional mobility training;Therapeutic activities;Therapeutic exercise;Balance training;Neuromuscular re-education;Patient/family education;Manual techniques;Dry needling;Taping    PT Next Visit Plan progress core stabilization and strengthening; manual work and DN Lt TFL/ITB.    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

## 2020-11-15 DIAGNOSIS — Z20822 Contact with and (suspected) exposure to covid-19: Secondary | ICD-10-CM | POA: Diagnosis not present

## 2020-11-19 DIAGNOSIS — M65342 Trigger finger, left ring finger: Secondary | ICD-10-CM | POA: Diagnosis not present

## 2020-11-19 DIAGNOSIS — S6992XA Unspecified injury of left wrist, hand and finger(s), initial encounter: Secondary | ICD-10-CM | POA: Diagnosis not present

## 2020-11-22 DIAGNOSIS — H2511 Age-related nuclear cataract, right eye: Secondary | ICD-10-CM | POA: Diagnosis not present

## 2020-11-22 DIAGNOSIS — H25811 Combined forms of age-related cataract, right eye: Secondary | ICD-10-CM | POA: Diagnosis not present

## 2020-12-12 DIAGNOSIS — R519 Headache, unspecified: Secondary | ICD-10-CM | POA: Diagnosis not present

## 2020-12-12 DIAGNOSIS — Z03818 Encounter for observation for suspected exposure to other biological agents ruled out: Secondary | ICD-10-CM | POA: Diagnosis not present

## 2020-12-13 DIAGNOSIS — H2512 Age-related nuclear cataract, left eye: Secondary | ICD-10-CM | POA: Diagnosis not present

## 2020-12-13 DIAGNOSIS — H25812 Combined forms of age-related cataract, left eye: Secondary | ICD-10-CM | POA: Diagnosis not present

## 2020-12-25 DIAGNOSIS — Z20822 Contact with and (suspected) exposure to covid-19: Secondary | ICD-10-CM | POA: Diagnosis not present

## 2020-12-27 DIAGNOSIS — Z85828 Personal history of other malignant neoplasm of skin: Secondary | ICD-10-CM | POA: Diagnosis not present

## 2020-12-27 DIAGNOSIS — D1801 Hemangioma of skin and subcutaneous tissue: Secondary | ICD-10-CM | POA: Diagnosis not present

## 2020-12-27 DIAGNOSIS — L570 Actinic keratosis: Secondary | ICD-10-CM | POA: Diagnosis not present

## 2020-12-27 DIAGNOSIS — L814 Other melanin hyperpigmentation: Secondary | ICD-10-CM | POA: Diagnosis not present

## 2020-12-27 DIAGNOSIS — L821 Other seborrheic keratosis: Secondary | ICD-10-CM | POA: Diagnosis not present

## 2020-12-27 DIAGNOSIS — D2271 Melanocytic nevi of right lower limb, including hip: Secondary | ICD-10-CM | POA: Diagnosis not present

## 2020-12-27 DIAGNOSIS — D225 Melanocytic nevi of trunk: Secondary | ICD-10-CM | POA: Diagnosis not present

## 2020-12-27 DIAGNOSIS — L820 Inflamed seborrheic keratosis: Secondary | ICD-10-CM | POA: Diagnosis not present

## 2020-12-27 DIAGNOSIS — D2372 Other benign neoplasm of skin of left lower limb, including hip: Secondary | ICD-10-CM | POA: Diagnosis not present

## 2021-01-10 DIAGNOSIS — Z03818 Encounter for observation for suspected exposure to other biological agents ruled out: Secondary | ICD-10-CM | POA: Diagnosis not present

## 2021-01-10 DIAGNOSIS — R6889 Other general symptoms and signs: Secondary | ICD-10-CM | POA: Diagnosis not present

## 2021-01-10 DIAGNOSIS — Z20828 Contact with and (suspected) exposure to other viral communicable diseases: Secondary | ICD-10-CM | POA: Diagnosis not present

## 2021-01-14 DIAGNOSIS — Z20822 Contact with and (suspected) exposure to covid-19: Secondary | ICD-10-CM | POA: Diagnosis not present

## 2021-01-17 DIAGNOSIS — H6123 Impacted cerumen, bilateral: Secondary | ICD-10-CM | POA: Diagnosis not present

## 2021-01-18 ENCOUNTER — Other Ambulatory Visit: Payer: Self-pay | Admitting: Orthopedic Surgery

## 2021-01-18 DIAGNOSIS — M65342 Trigger finger, left ring finger: Secondary | ICD-10-CM | POA: Diagnosis not present

## 2021-01-26 DIAGNOSIS — S9001XA Contusion of right ankle, initial encounter: Secondary | ICD-10-CM | POA: Diagnosis not present

## 2021-01-26 DIAGNOSIS — X58XXXA Exposure to other specified factors, initial encounter: Secondary | ICD-10-CM | POA: Diagnosis not present

## 2021-02-06 DIAGNOSIS — M47817 Spondylosis without myelopathy or radiculopathy, lumbosacral region: Secondary | ICD-10-CM | POA: Diagnosis not present

## 2021-02-17 DIAGNOSIS — Z20822 Contact with and (suspected) exposure to covid-19: Secondary | ICD-10-CM | POA: Diagnosis not present

## 2021-02-19 DIAGNOSIS — M47816 Spondylosis without myelopathy or radiculopathy, lumbar region: Secondary | ICD-10-CM | POA: Diagnosis not present

## 2021-02-21 ENCOUNTER — Encounter (HOSPITAL_BASED_OUTPATIENT_CLINIC_OR_DEPARTMENT_OTHER): Payer: Self-pay | Admitting: Orthopedic Surgery

## 2021-02-21 ENCOUNTER — Other Ambulatory Visit: Payer: Self-pay

## 2021-02-25 ENCOUNTER — Encounter: Payer: Self-pay | Admitting: Sports Medicine

## 2021-02-25 DIAGNOSIS — L570 Actinic keratosis: Secondary | ICD-10-CM | POA: Diagnosis not present

## 2021-02-25 DIAGNOSIS — Z85828 Personal history of other malignant neoplasm of skin: Secondary | ICD-10-CM | POA: Diagnosis not present

## 2021-02-25 DIAGNOSIS — M47897 Other spondylosis, lumbosacral region: Secondary | ICD-10-CM

## 2021-02-27 ENCOUNTER — Encounter (HOSPITAL_BASED_OUTPATIENT_CLINIC_OR_DEPARTMENT_OTHER)
Admission: RE | Admit: 2021-02-27 | Discharge: 2021-02-27 | Disposition: A | Discharge status: Home / Self Care | Payer: Medicare Other | Source: Ambulatory Visit | Attending: Orthopedic Surgery | Admitting: Orthopedic Surgery

## 2021-02-27 DIAGNOSIS — Z01812 Encounter for preprocedural laboratory examination: Secondary | ICD-10-CM | POA: Diagnosis not present

## 2021-02-27 LAB — BASIC METABOLIC PANEL
Anion gap: 8 (ref 5–15)
BUN: 15 mg/dL (ref 8–23)
CO2: 31 mmol/L (ref 22–32)
Calcium: 9.7 mg/dL (ref 8.9–10.3)
Chloride: 100 mmol/L (ref 98–111)
Creatinine, Ser: 0.88 mg/dL (ref 0.44–1.00)
GFR, Estimated: >60 mL/min (ref >60)
Glucose, Bld: 110 mg/dL — ABNORMAL HIGH (ref 70–99)
Potassium: 4.1 mmol/L (ref 3.5–5.1)
Sodium: 139 mmol/L (ref 135–145)

## 2021-02-27 NOTE — Progress Notes (Signed)

## 2021-02-28 DIAGNOSIS — M47816 Spondylosis without myelopathy or radiculopathy, lumbar region: Secondary | ICD-10-CM | POA: Diagnosis not present

## 2021-02-28 DIAGNOSIS — M545 Low back pain, unspecified: Secondary | ICD-10-CM | POA: Diagnosis not present

## 2021-02-28 DIAGNOSIS — G8929 Other chronic pain: Secondary | ICD-10-CM | POA: Diagnosis not present

## 2021-03-05 ENCOUNTER — Ambulatory Visit (HOSPITAL_BASED_OUTPATIENT_CLINIC_OR_DEPARTMENT_OTHER)
Admission: RE | Admit: 2021-03-05 | Discharge: 2021-03-05 | Disposition: A | Discharge status: Home / Self Care | Payer: Medicare Other | Source: Ambulatory Visit | Attending: Orthopedic Surgery | Admitting: Orthopedic Surgery

## 2021-03-05 ENCOUNTER — Other Ambulatory Visit: Payer: Self-pay

## 2021-03-05 ENCOUNTER — Ambulatory Visit (HOSPITAL_BASED_OUTPATIENT_CLINIC_OR_DEPARTMENT_OTHER): Payer: Medicare Other | Admitting: Anesthesiology

## 2021-03-05 ENCOUNTER — Encounter (HOSPITAL_BASED_OUTPATIENT_CLINIC_OR_DEPARTMENT_OTHER): Payer: Self-pay | Admitting: Orthopedic Surgery

## 2021-03-05 ENCOUNTER — Encounter (HOSPITAL_BASED_OUTPATIENT_CLINIC_OR_DEPARTMENT_OTHER)
Admission: RE | Disposition: A | Discharge status: Home / Self Care | Payer: Self-pay | Source: Ambulatory Visit | Attending: Orthopedic Surgery

## 2021-03-05 DIAGNOSIS — F419 Anxiety disorder, unspecified: Secondary | ICD-10-CM

## 2021-03-05 DIAGNOSIS — I10 Essential (primary) hypertension: Secondary | ICD-10-CM

## 2021-03-05 DIAGNOSIS — Z79899 Other long term (current) drug therapy: Secondary | ICD-10-CM

## 2021-03-05 DIAGNOSIS — K219 Gastro-esophageal reflux disease without esophagitis: Secondary | ICD-10-CM | POA: Diagnosis not present

## 2021-03-05 DIAGNOSIS — M659 Synovitis and tenosynovitis, unspecified: Secondary | ICD-10-CM | POA: Diagnosis not present

## 2021-03-05 DIAGNOSIS — Z87891 Personal history of nicotine dependence: Secondary | ICD-10-CM | POA: Insufficient documentation

## 2021-03-05 DIAGNOSIS — M65842 Other synovitis and tenosynovitis, left hand: Secondary | ICD-10-CM | POA: Diagnosis not present

## 2021-03-05 DIAGNOSIS — M1990 Unspecified osteoarthritis, unspecified site: Secondary | ICD-10-CM | POA: Insufficient documentation

## 2021-03-05 DIAGNOSIS — M65342 Trigger finger, left ring finger: Secondary | ICD-10-CM | POA: Diagnosis not present

## 2021-03-05 HISTORY — PX: TRIGGER FINGER RELEASE: SHX641

## 2021-03-05 HISTORY — DX: Gastro-esophageal reflux disease without esophagitis: K21.9

## 2021-03-05 SURGERY — RELEASE, A1 PULLEY, FOR TRIGGER FINGER
Anesthesia: Regional | Site: Hand | Laterality: Left

## 2021-03-05 MED ORDER — HYDROMORPHONE HCL 1 MG/ML IJ SOLN: 0.2500 mg | INTRAMUSCULAR | Status: DC | PRN

## 2021-03-05 MED ORDER — ONDANSETRON HCL 4 MG/2ML IJ SOLN
INTRAMUSCULAR | Status: DC | PRN
  Administered 2021-03-05: 4 mg via INTRAVENOUS

## 2021-03-05 MED ORDER — ONDANSETRON HCL 4 MG/2ML IJ SOLN
INTRAMUSCULAR | Status: AC
  Filled 2021-03-05: qty 2

## 2021-03-05 MED ORDER — FENTANYL CITRATE (PF) 100 MCG/2ML IJ SOLN
INTRAMUSCULAR | Status: AC
  Filled 2021-03-05: qty 2

## 2021-03-05 MED ORDER — MIDAZOLAM HCL 5 MG/5ML IJ SOLN
INTRAMUSCULAR | Status: DC | PRN
  Administered 2021-03-05: 2 mg via INTRAVENOUS

## 2021-03-05 MED ORDER — PROPOFOL 500 MG/50ML IV EMUL
INTRAVENOUS | Status: DC | PRN
  Administered 2021-03-05: 35 ug/kg/min via INTRAVENOUS

## 2021-03-05 MED ORDER — CEFAZOLIN SODIUM-DEXTROSE 2-4 GM/100ML-% IV SOLN
INTRAVENOUS | Status: AC
Start: 2021-03-05 — End: ?
  Filled 2021-03-05: qty 100

## 2021-03-05 MED ORDER — BUPIVACAINE HCL (PF) 0.25 % IJ SOLN
INTRAMUSCULAR | Status: DC | PRN
  Administered 2021-03-05: 6 mL

## 2021-03-05 MED ORDER — LACTATED RINGERS IV SOLN
INTRAVENOUS | Status: DC
Start: 2021-03-05 — End: 2021-03-05

## 2021-03-05 MED ORDER — TRAMADOL HCL 50 MG PO TABS
50.0000 mg | ORAL_TABLET | Freq: Four times a day (QID) | ORAL | 0 refills | Status: DC | PRN
Start: 2021-03-05 — End: 2022-01-29

## 2021-03-05 MED ORDER — 0.9 % SODIUM CHLORIDE (POUR BTL) OPTIME
TOPICAL | Status: DC | PRN
  Administered 2021-03-05: 75 mL

## 2021-03-05 MED ORDER — MIDAZOLAM HCL 2 MG/2ML IJ SOLN
INTRAMUSCULAR | Status: AC
  Filled 2021-03-05: qty 2

## 2021-03-05 MED ORDER — FENTANYL CITRATE (PF) 100 MCG/2ML IJ SOLN
INTRAMUSCULAR | Status: DC | PRN
  Administered 2021-03-05: 50 ug via INTRAVENOUS

## 2021-03-05 MED ORDER — CEFAZOLIN SODIUM-DEXTROSE 2-4 GM/100ML-% IV SOLN
2.0000 g | INTRAVENOUS | Status: AC
  Administered 2021-03-05: 2 g via INTRAVENOUS

## 2021-03-05 MED ORDER — PROMETHAZINE HCL 25 MG/ML IJ SOLN: 6.2500 mg | INTRAMUSCULAR | Status: DC | PRN

## 2021-03-05 MED ORDER — PROPOFOL 500 MG/50ML IV EMUL
INTRAVENOUS | Status: AC
  Filled 2021-03-05: qty 50

## 2021-03-05 SURGICAL SUPPLY — 32 items
APL PRP STRL LF DISP 70% ISPRP (MISCELLANEOUS) ×1
BLADE SURG 15 STRL LF DISP TIS (BLADE) ×2 IMPLANT
BLADE SURG 15 STRL SS (BLADE) ×2
BNDG CMPR 5X2 CHSV 1 LYR STRL (GAUZE/BANDAGES/DRESSINGS) ×1
BNDG CMPR 9X4 STRL LF SNTH (GAUZE/BANDAGES/DRESSINGS) ×1
BNDG COHESIVE 2X5 TAN ST LF (GAUZE/BANDAGES/DRESSINGS) ×3 IMPLANT
BNDG ESMARK 4X9 LF (GAUZE/BANDAGES/DRESSINGS) ×1 IMPLANT
CHLORAPREP W/TINT 26 (MISCELLANEOUS) ×3 IMPLANT
CORD BIPOLAR FORCEPS 12FT (ELECTRODE) IMPLANT
COVER BACK TABLE 60X90IN (DRAPES) ×3 IMPLANT
COVER MAYO STAND STRL (DRAPES) ×3 IMPLANT
CUFF TOURN SGL QUICK 18X4 (TOURNIQUET CUFF) ×1 IMPLANT
DRAPE EXTREMITY T 121X128X90 (DISPOSABLE) ×3 IMPLANT
DRAPE SURG 17X23 STRL (DRAPES) ×3 IMPLANT
GAUZE SPONGE 4X4 12PLY STRL (GAUZE/BANDAGES/DRESSINGS) ×3 IMPLANT
GAUZE XEROFORM 1X8 LF (GAUZE/BANDAGES/DRESSINGS) ×3 IMPLANT
GLOVE SURG ORTHO LTX SZ8 (GLOVE) ×3 IMPLANT
GLOVE SURG UNDER POLY LF SZ8.5 (GLOVE) ×3 IMPLANT
GOWN STRL REUS W/ TWL LRG LVL3 (GOWN DISPOSABLE) ×2 IMPLANT
GOWN STRL REUS W/TWL LRG LVL3 (GOWN DISPOSABLE) ×2
GOWN STRL REUS W/TWL XL LVL3 (GOWN DISPOSABLE) ×3 IMPLANT
NDL HYPO 27GX1-1/4 (NEEDLE) ×2 IMPLANT
NEEDLE HYPO 27GX1-1/4 (NEEDLE) ×2 IMPLANT
NS IRRIG 1000ML POUR BTL (IV SOLUTION) ×3 IMPLANT
PACK BASIN DAY SURGERY FS (CUSTOM PROCEDURE TRAY) ×3 IMPLANT
SPIKE FLUID TRANSFER (MISCELLANEOUS) IMPLANT
STOCKINETTE 4X48 STRL (DRAPES) ×3 IMPLANT
SUT ETHILON 4 0 PS 2 18 (SUTURE) ×3 IMPLANT
SYR BULB EAR ULCER 3OZ GRN STR (SYRINGE) ×3 IMPLANT
SYR CONTROL 10ML LL (SYRINGE) ×3 IMPLANT
TOWEL GREEN STERILE FF (TOWEL DISPOSABLE) ×6 IMPLANT
UNDERPAD 30X36 HEAVY ABSORB (UNDERPADS AND DIAPERS) ×3 IMPLANT

## 2021-03-05 NOTE — Brief Op Note (Signed)
03/05/2021  10:04 AM  PATIENT:  Tammy Weeks  66 y.o. female  PRE-OPERATIVE DIAGNOSIS:  LEFT RING FINGER TRIGGER  POST-OPERATIVE DIAGNOSIS:  LEFT RING FINGER TRIGGER  PROCEDURE:  Procedure(s) with comments: RELEASE TRIGGER FINGER/A-1 PULLEY LEFT RING FINGER (Left) - 45 MIN  SURGEON:  Surgeon(s) and Role:    * Daryll Brod, MD - Primary  PHYSICIAN ASSISTANT:   ASSISTANTS: none   ANESTHESIA:   local, regional, and IV sedation  EBL:  82ml  BLOOD ADMINISTERED:none  DRAINS: none   LOCAL MEDICATIONS USED:  BUPIVICAINE   SPECIMEN:  No Specimen  DISPOSITION OF SPECIMEN:  N/A  COUNTS:  YES  TOURNIQUET:   Total Tourniquet Time Documented: Forearm (Left) - 18 minutes Total: Forearm (Left) - 18 minutes   DICTATION: .Dragon Dictation  PLAN OF CARE: Discharge to home after PACU  PATIENT DISPOSITION:  PACU - hemodynamically stable.

## 2021-03-05 NOTE — Discharge Instructions (Addendum)

## 2021-03-05 NOTE — H&P (Signed)
°  Tammy Weeks is an 66 y.o. female.   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 She comes in complaining of her left hand she has triggering of her left ring finger which has been going up 2 months approximately week ago had her left middle finger bent back and complains of pain at the metacarpal phalangeal joint this was while traveling. She recalls no history of injury to the ring finger. She states nothing seems to make better or worse she has tried ice. She states that it is not particularly bad at any given time of the day she does not have to use her opposite hand to straighten it. She has no prior history of problems with her hand. She has no numbness or tingling. She has no history of diabetes thyroid problems or gout she does have history of arthritis. Family history is negative for each She has had 2 injections. He states that on her last visit was not bothering to consider doing anything to it. Her middle finger is doing well. She continues complain of some soreness and discomfort with it. She is continuing to complain of triggering of the left ring finger. She has no history of diabetes thyroid problems or gout. She does have a history of arthritis. Family history is negative for each Past Medical History:  Diagnosis Date   Anxiety    GERD (gastroesophageal reflux disease)    High cholesterol    Hypertension     Past Surgical History:  Procedure Laterality Date   ABDOMINAL HYSTERECTOMY     ADRENAL GLAND SURGERY     CATARACT EXTRACTION      History reviewed. No pertinent family history. Social History:  reports that she has quit smoking. She has never used smokeless tobacco. She reports current alcohol use. She reports that she does not use drugs.  Allergies:  Allergies  Allergen Reactions   Ciprofloxacin Rash   Codeine Rash   Oxycodone Rash   Quinolones Rash   Sulfa Antibiotics Rash     No medications prior to admission.    No results found for this or any previous visit (from the past 48 hour(s)).  No results found.   Pertinent items are noted in HPI.  Height 5\' 2"  (1.575 m), weight 68 kg.  General appearance: alert, cooperative, and appears stated age Head: Normocephalic, without obvious abnormality, atraumatic Neck: no JVD Resp: clear to auscultation bilaterally Cardio: regular rate and rhythm, S1, S2 normal, no murmur, click, rub or gallop GI: soft, non-tender; bowel sounds normal; no masses,  no organomegaly Extremities:  Catching left ring finger Pulses: 2+ and symmetric Skin: Skin color, texture, turgor normal. No rashes or lesions Neurologic: Grossly normal Incision/Wound: na  Assessment/Plan Assessment:  1. Trigger ring finger of left hand     Plan:  She would like to proceed to have the surgery corrector ring finger. Pre-peripostoperative course are discussed along with the risk and complications. She is aware that there is no guarantee to the surgery the possibility of infection recurrence injury to arteries nerves tendons complete relief symptoms dystrophy. She is scheduled for release A1 pulley of the ring finger as an outpatient under regional anesthesia. They are going to [place] in February. Would like to wait till they come back for surgery.  Daryll Brod 03/05/2021, 5:24 AM

## 2021-03-05 NOTE — Anesthesia Preprocedure Evaluation (Signed)
Anesthesia Evaluation  Patient identified by MRN, date of birth, ID band Patient awake    Reviewed: Allergy & Precautions, NPO status , Patient's Chart, lab work & pertinent test results  Airway Mallampati: II  TM Distance: >3 FB Neck ROM: Full    Dental no notable dental hx.    Pulmonary neg pulmonary ROS, former smoker,    Pulmonary exam normal breath sounds clear to auscultation       Cardiovascular hypertension, Pt. on medications negative cardio ROS Normal cardiovascular exam Rhythm:Regular Rate:Normal     Neuro/Psych Anxiety negative neurological ROS  negative psych ROS   GI/Hepatic Neg liver ROS, GERD  ,  Endo/Other  negative endocrine ROS  Renal/GU negative Renal ROS  negative genitourinary   Musculoskeletal  (+) Arthritis , Osteoarthritis,    Abdominal   Peds negative pediatric ROS (+)  Hematology negative hematology ROS (+)   Anesthesia Other Findings   Reproductive/Obstetrics negative OB ROS                             Anesthesia Physical Anesthesia Plan  ASA: 2  Anesthesia Plan: Bier Block and Bier Block-LIDOCAINE ONLY   Post-op Pain Management:    Induction: Intravenous  PONV Risk Score and Plan: 2 and Ondansetron, Midazolam and Treatment may vary due to age or medical condition  Airway Management Planned: Simple Face Mask  Additional Equipment:   Intra-op Plan:   Post-operative Plan:   Informed Consent: I have reviewed the patients History and Physical, chart, labs and discussed the procedure including the risks, benefits and alternatives for the proposed anesthesia with the patient or authorized representative who has indicated his/her understanding and acceptance.     Dental advisory given  Plan Discussed with: CRNA  Anesthesia Plan Comments:         Anesthesia Quick Evaluation

## 2021-03-05 NOTE — Transfer of Care (Signed)
Immediate Anesthesia Transfer of Care Note  Patient: Tammy Weeks [facility]  Procedure(s) Performed: RELEASE TRIGGER FINGER/A-1 PULLEY LEFT RING FINGER (Left: Hand)  Patient Location: PACU  Anesthesia Type:MAC and Bier block  Level of Consciousness: awake, alert  and oriented  Airway & Oxygen Therapy: Patient Spontanous Breathing and Patient connected to face mask oxygen  Post-op Assessment: Report given to RN and Post -op Vital signs reviewed and stable  Post vital signs: Reviewed and stable  Last Vitals:  Vitals Value Taken Time  BP 91/69 03/05/21 1005  Temp    Pulse 52 03/05/21 1006  Resp 18 03/05/21 1006  SpO2 92 % 03/05/21 1006  Vitals shown include unvalidated device data.  Last Pain:  Vitals:   03/05/21 0834  TempSrc: Oral  PainSc: 0-No pain         Complications: No notable events documented.

## 2021-03-05 NOTE — Anesthesia Postprocedure Evaluation (Signed)
Anesthesia Post Note  Patient: Tammy Weeks [facility]  Procedure(s) Performed: RELEASE TRIGGER FINGER/A-1 PULLEY LEFT RING FINGER (Left: Hand)     Patient location during evaluation: PACU Anesthesia Type: Bier Block Level of consciousness: awake and alert Pain management: pain level controlled Vital Signs Assessment: post-procedure vital signs reviewed and stable Respiratory status: spontaneous breathing, nonlabored ventilation and respiratory function stable Cardiovascular status: blood pressure returned to baseline and stable Postop Assessment: no apparent nausea or vomiting Anesthetic complications: no   No notable events documented.  Last Vitals:  Vitals:   03/05/21 1015 03/05/21 1027  BP: 111/78 116/82  Pulse: (!) 53 (!) 56  Resp: 13 16  Temp:  36.4 C  SpO2: 97% 94%    Last Pain:  Vitals:   03/05/21 1027  TempSrc:   PainSc: 0-No pain                 Lynda Rainwater

## 2021-03-05 NOTE — Op Note (Signed)
NAME: Tammy Weeks [facility] MEDICAL RECORD NO: [id number] DATE OF BIRTH: 10/24/1955 FAC[facility] LOCATION: [facility] PHYSICIAN: Wynonia Sours, MD   OPERATIVE REPORT   DATE OF PROCEDURE: 03/05/21    PREOPERATIVE DIAGNOSIS: Stenosing tenosynovitis left ring finger   POSTOPERATIVE DIAGNOSIS: Same   PROCEDURE: Release A1 pulley left ring finger   SURGEON: Daryll Brod, M.D.   ASSISTANT: none   ANESTHESIA:  Bier block with sedation and Local   INTRAVENOUS FLUIDS:  Per anesthesia flow sheet.   ESTIMATED BLOOD LOSS:  Minimal.   COMPLICATIONS:  None.   SPECIMENS:  none   TOURNIQUET TIME:    Total Tourniquet Time Documented: Forearm (Left) - 18 minutes Total: Forearm (Left) - 18 minutes    DISPOSITION:  Stable to PACU.   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  This not responded to conservative treatment.  She is elected undergo surgical release of the A1 pulley.  Preperi-and postoperative course been discussed along with risks and complications.  She is aware that there is no guarantee to the surgery the possibility of infection recurrence injury to arteries nerves tendons complete relief symptoms dystrophy.  Preoperative area the patient seen extremity marked by both patient and surgeon antibiotic given  OPERATIVE COURSE: Patient brought to the operating room placed in the supine position with left arm free.  A forearm IV regional anesthetic was carried out without difficulty under the direction the anesthesia department.  Prep was done with ChloraPrep.  3-minute dry time was allowed timeout taken confirm patient procedure.  An oblique incision was made over the A1 pulley of the left ring finger carried down through subcutaneous tissue.  Neurovascular structures were identified protected and the incision was made on the radial aspect of the A1 pulley a small incision made centrally and A2.  The deep  flexor tendons were then separated breaking any adhesions between the 2 tendons.  Finger was placed through full passive range of motion no further triggering was noted.  The procedure the patient had some feeling and a local infiltration quarter percent bupivacaine with was given.  This was supplemented at the end of the procedure after closure of the wound with interrupted 4 nylon sutures.  A total of approximately 5 cc was used.  Sterile compressive dressing with fingers free was applied.  Deflation of the tourniquet all fingers immediately pink.  She was taken to the recovery room for observation in satisfactory condition.  She will be discharged home to return to the hand center [facility] in 1 week with on Tylenol ibuprofen for pain with Ultram for breakthrough.   Daryll Brod, MD Electronically signed, 03/05/21

## 2021-03-06 ENCOUNTER — Encounter (HOSPITAL_BASED_OUTPATIENT_CLINIC_OR_DEPARTMENT_OTHER): Payer: Self-pay | Admitting: Orthopedic Surgery

## 2021-03-11 ENCOUNTER — Other Ambulatory Visit: Payer: Self-pay

## 2021-03-11 ENCOUNTER — Encounter: Payer: Self-pay | Admitting: Rehabilitative and Restorative Service Providers"

## 2021-03-11 ENCOUNTER — Ambulatory Visit: Payer: Medicare Other | Attending: Sports Medicine | Admitting: Rehabilitative and Restorative Service Providers"

## 2021-03-11 DIAGNOSIS — G8929 Other chronic pain: Secondary | ICD-10-CM | POA: Diagnosis not present

## 2021-03-11 DIAGNOSIS — M545 Low back pain, unspecified: Secondary | ICD-10-CM | POA: Diagnosis not present

## 2021-03-11 DIAGNOSIS — R29898 Other symptoms and signs involving the musculoskeletal system: Secondary | ICD-10-CM

## 2021-03-11 DIAGNOSIS — M47897 Other spondylosis, lumbosacral region: Secondary | ICD-10-CM | POA: Insufficient documentation

## 2021-03-11 NOTE — Patient Instructions (Signed)

## 2021-03-11 NOTE — Therapy (Signed)
Tammy Weeks, [place], [address] Phone: [phone]   Fax:  [phone]  Physical Therapy Evaluation  Patient Details  Name: Tammy Weeks MRN: [id number] Date of Birth: Apr 12, 1955 Referring Provider (PT): Dr Dianah Field   Encounter Date: 03/11/2021   PT End of Session - 03/11/21 0933     Visit Number 1    Number of Visits 12    Date for PT Re-Evaluation 04/22/21    PT Start Time 0845    PT Stop Time 0935    PT Time Calculation (min) 50 min    Activity Tolerance Patient tolerated treatment well             Past Medical History:  Diagnosis Date   Anxiety    GERD (gastroesophageal reflux disease)    High cholesterol    Hypertension     Past Surgical History:  Procedure Laterality Date   ABDOMINAL HYSTERECTOMY     ADRENAL GLAND SURGERY     CATARACT EXTRACTION     TRIGGER FINGER RELEASE Left 03/05/2021   Procedure: RELEASE TRIGGER FINGER/A-1 PULLEY LEFT RING FINGER;  Surgeon: Daryll Brod, MD;  Location: [facility];  Service: Orthopedics;  Laterality: Left;  45 MIN    There were no vitals filed for this visit.    Subjective Assessment - 03/11/21 0851     Subjective Patient reports pain in the Lt > Rt posterior hip and LB. She has had pain for the past 8-9 months. She notices improvement with DN in past. She continuing to work on exercises on a regular basis. She is walking 5 days a week. Symptoms have improved since last PT but she continues to have pain.    Pertinent History OA    Patient Stated Goals help decrease pain in the LB and Lt hip    Currently in Pain? Yes    Pain Score 1     Pain Location Back    Pain Orientation Left;Right;Lower    Pain Descriptors / Indicators Nagging;Aching    Pain Type Chronic pain    Pain Radiating Towards Lt posterior hip    Pain Onset More than a month ago    Pain Frequency Constant    Aggravating Factors  walking up hills;  driving; prolonged sitting                [facility] PT Assessment - 03/11/21 0001       Assessment   Medical Diagnosis LBP; Lt > Rt posterior hip pain    Referring Provider (PT) Dr Dianah Field    Onset Date/Surgical Date 05/13/20    Hand Dominance Right    Next MD Visit PRN    Prior Therapy here for LBP      Precautions   Precautions None      Restrictions   Weight Bearing Restrictions No      Balance Screen   Has the patient fallen in the past 6 months No    Has the patient had a decrease in activity level because of a fear of falling?  No    Is the patient reluctant to leave their home because of a fear of falling?  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    Leisure  household chores; walking 5 days per week; exercises 3-5 days/wk; stretches daily      Observation/Other Assessments   Focus on Therapeutic Outcomes (FOTO)  51      Sensation   Additional Comments WFL's per pt report      AROM   Lumbar Flexion 80% pulling Lt posterior hip    Lumbar Extension 65% discomfort Lt    Lumbar - Right Side Bend 95%    Lumbar - Left Side Bend 90% discomfort Lt    Lumbar - Right Rotation 70%    Lumbar - Left Rotation 65% discomfort Lt      Strength   Overall Strength Comments WFL's not tested resistively      Flexibility   Hamstrings WNL's    Quadriceps tight Rt > Lt    ITB WFL's bilat    Piriformis tight Lt      Palpation   Spinal mobility hypomobile lumbar spine    Palpation comment significant tightness in the Lt piriformis, glut med/min      Special Tests   Other special tests leg length equal with supine to sit and in standing; hooklying Lt knee shorter than Rt      Ambulation/Gait   Gait Comments WFL's                        Objective measurements completed on  examination: See above findings.       [place] Adult PT Treatment/Exercise - 03/11/21 0001       Moist Heat Therapy   Number Minutes Moist Heat 5 Minutes    Moist Heat Location Cervical;Hip      Manual Therapy   Manual therapy comments skilled palpation for assessment of DN and manual work    Joint Mobilization PA mobs Lt greater pt prone    Soft tissue mobilization deep tissue work through posterior/lateral hip- ITB piriformis and hip abductors              Trigger Point Dry Needling - 03/11/21 0001     Consent Given? Yes    Education Handout Provided Yes    Gluteus Minimus Response Palpable increased muscle length;Twitch response elicited    Gluteus Maximus Response Palpable increased muscle length;Twitch response elicited    Piriformis Response Palpable increased muscle length;Twitch response elicited                   PT Education - 03/11/21 0933     Education Details DN    Person(s) Educated Patient    Methods Explanation;Handout    Comprehension Verbalized understanding                 PT Long Term Goals - 03/11/21 1257       PT LONG TERM GOAL #1   Title Decrease/eliminate LBP and Lt posterior hip pain 75-100% of the day, allowing patient to return to all normal functional activities    Time 6    Period Weeks    Status New    Target Date 04/22/21      PT LONG TERM GOAL #2   Title Increase AROM lumbar spine and Lt hip to WNL's and pain free    Time 6    Period Weeks    Status New    Target Date 04/22/21      PT LONG TERM GOAL #3   Title Patient reports return to walking program without increase in pain    Baseline Able to  walk, but pain up to 3/10 afterwards.    Time 6    Period Weeks    Status New    Target Date 04/22/21      PT LONG TERM GOAL #4   Title Independent in HEP    Time 6    Period Weeks    Status New    Target Date 04/22/21      PT LONG TERM GOAL #5   Title Improve functional limitation score to 65    Time 6     Period Weeks    Status New    Target Date 04/22/21                    Plan - 03/11/21 0933     Clinical Impression Statement Patient presents with persistent pain in the Lt > Rt lumbar and posterior hip area following a fall landing on the Lt hip 5/22. She has been treated with DN, manual work, stretching and strengthening. She received an ESI without improvement. Patient states that she will have acupuncture per recommendation from [facility] whom she is seeing, Patient has limited trunk and Lt LE mobility/ROM; muscular tightness through the Lt lateral sacrum, piriformis, glut med, glut min; pain limiting functional activities. Patient will benefit from PT to address problems identified. Patient has had good results with DN, manual work, exercises and modalities in the past.    Stability/Clinical Decision Making Stable/Uncomplicated    Clinical Decision Making Low    Rehab Potential Good    PT Frequency 2x / week    PT Duration 6 weeks    PT Treatment/Interventions ADLs/Self Care Home Management;Aquatic Therapy;Cryotherapy;Electrical Stimulation;Iontophoresis 4mg /ml Dexamethasone;Moist Heat;Ultrasound;Therapeutic activities;Therapeutic exercise;Neuromuscular re-education;Balance training;Patient/family education;Manual techniques;Dry needling;Taping    PT Next Visit Plan review HEP; further assessment of SI; DN/manual work Lt posterior hip; modalities as indicated    PT Home Exercise Plan [id number]    Consulted and Agree with Plan of Care Patient             Patient will benefit from skilled therapeutic intervention in order to improve the following deficits and impairments:  Decreased range of motion, Decreased activity tolerance, Pain, Impaired flexibility, Hypomobility, Improper body mechanics, Decreased mobility, Decreased strength, Postural dysfunction  Visit Diagnosis: Chronic left-sided low back pain without sciatica  Other symptoms and signs involving the  musculoskeletal system     Problem List Patient Active Problem List   Diagnosis Date Noted   Lumbosacral spondylosis 05/21/2020   Coughing 01/09/2020   Herpes simplex 07/21/2019   Orbital wrinkles 08/19/2018   Primary osteoarthritis of both knees 07/13/2015   Low grade squamous intraepithelial lesion (LGSIL) on cervicovaginal cytologic smear 03/19/2011    Timea Breed Nilda Simmer, PT, MPH 03/11/2021, 1:02 PM  [facility] [address] [place], [place], [address] Phone: [phone]   Fax:  [phone]  Name: LATRESHIA BEAUCHAINE MRN: [id number] Date of Birth: 07/11/1955

## 2021-03-13 ENCOUNTER — Ambulatory Visit: Payer: Medicare Other | Admitting: Rehabilitative and Restorative Service Providers"

## 2021-03-18 DIAGNOSIS — G8929 Other chronic pain: Secondary | ICD-10-CM | POA: Diagnosis not present

## 2021-03-18 DIAGNOSIS — M47817 Spondylosis without myelopathy or radiculopathy, lumbosacral region: Secondary | ICD-10-CM | POA: Diagnosis not present

## 2021-03-18 DIAGNOSIS — M545 Low back pain, unspecified: Secondary | ICD-10-CM | POA: Diagnosis not present

## 2021-03-20 DIAGNOSIS — Z20822 Contact with and (suspected) exposure to covid-19: Secondary | ICD-10-CM | POA: Diagnosis not present

## 2021-03-21 ENCOUNTER — Ambulatory Visit: Payer: Medicare Other | Admitting: Rehabilitative and Restorative Service Providers"

## 2021-03-26 ENCOUNTER — Encounter: Payer: Medicare Other | Admitting: Rehabilitative and Restorative Service Providers"

## 2021-04-01 ENCOUNTER — Encounter: Payer: Medicare Other | Admitting: Rehabilitative and Restorative Service Providers"

## 2021-04-03 ENCOUNTER — Encounter: Payer: Medicare Other | Admitting: Rehabilitative and Restorative Service Providers"

## 2021-04-08 DIAGNOSIS — E782 Mixed hyperlipidemia: Secondary | ICD-10-CM | POA: Diagnosis not present

## 2021-04-08 DIAGNOSIS — I10 Essential (primary) hypertension: Secondary | ICD-10-CM | POA: Diagnosis not present

## 2021-04-11 DIAGNOSIS — Z0000 Encounter for general adult medical examination without abnormal findings: Secondary | ICD-10-CM | POA: Diagnosis not present

## 2021-04-11 DIAGNOSIS — I10 Essential (primary) hypertension: Secondary | ICD-10-CM | POA: Diagnosis not present

## 2021-04-11 DIAGNOSIS — E782 Mixed hyperlipidemia: Secondary | ICD-10-CM | POA: Diagnosis not present

## 2021-04-11 DIAGNOSIS — I700 Atherosclerosis of aorta: Secondary | ICD-10-CM | POA: Diagnosis not present

## 2021-04-11 DIAGNOSIS — D3500 Benign neoplasm of unspecified adrenal gland: Secondary | ICD-10-CM | POA: Diagnosis not present

## 2021-04-11 DIAGNOSIS — R911 Solitary pulmonary nodule: Secondary | ICD-10-CM | POA: Diagnosis not present

## 2021-04-11 DIAGNOSIS — Z1389 Encounter for screening for other disorder: Secondary | ICD-10-CM | POA: Diagnosis not present

## 2021-04-11 DIAGNOSIS — F419 Anxiety disorder, unspecified: Secondary | ICD-10-CM | POA: Diagnosis not present

## 2021-04-11 DIAGNOSIS — M545 Low back pain, unspecified: Secondary | ICD-10-CM | POA: Diagnosis not present

## 2021-04-12 ENCOUNTER — Other Ambulatory Visit: Payer: Self-pay | Admitting: Internal Medicine

## 2021-04-12 DIAGNOSIS — G8929 Other chronic pain: Secondary | ICD-10-CM | POA: Diagnosis not present

## 2021-04-12 DIAGNOSIS — R911 Solitary pulmonary nodule: Secondary | ICD-10-CM

## 2021-04-12 DIAGNOSIS — M545 Low back pain, unspecified: Secondary | ICD-10-CM | POA: Diagnosis not present

## 2021-04-12 DIAGNOSIS — M47817 Spondylosis without myelopathy or radiculopathy, lumbosacral region: Secondary | ICD-10-CM | POA: Diagnosis not present

## 2021-04-16 DIAGNOSIS — H6122 Impacted cerumen, left ear: Secondary | ICD-10-CM | POA: Diagnosis not present

## 2021-04-23 DIAGNOSIS — M47817 Spondylosis without myelopathy or radiculopathy, lumbosacral region: Secondary | ICD-10-CM | POA: Diagnosis not present

## 2021-04-23 DIAGNOSIS — M545 Low back pain, unspecified: Secondary | ICD-10-CM | POA: Diagnosis not present

## 2021-04-23 DIAGNOSIS — G8929 Other chronic pain: Secondary | ICD-10-CM | POA: Diagnosis not present

## 2021-04-29 DIAGNOSIS — Z20822 Contact with and (suspected) exposure to covid-19: Secondary | ICD-10-CM | POA: Diagnosis not present

## 2021-05-02 DIAGNOSIS — Z6829 Body mass index (BMI) 29.0-29.9, adult: Secondary | ICD-10-CM | POA: Diagnosis not present

## 2021-05-02 DIAGNOSIS — N319 Neuromuscular dysfunction of bladder, unspecified: Secondary | ICD-10-CM | POA: Diagnosis not present

## 2021-05-02 DIAGNOSIS — Z124 Encounter for screening for malignant neoplasm of cervix: Secondary | ICD-10-CM | POA: Diagnosis not present

## 2021-05-02 DIAGNOSIS — Z779 Other contact with and (suspected) exposures hazardous to health: Secondary | ICD-10-CM | POA: Diagnosis not present

## 2021-05-06 DIAGNOSIS — G8929 Other chronic pain: Secondary | ICD-10-CM | POA: Diagnosis not present

## 2021-05-06 DIAGNOSIS — M47817 Spondylosis without myelopathy or radiculopathy, lumbosacral region: Secondary | ICD-10-CM | POA: Diagnosis not present

## 2021-05-06 DIAGNOSIS — M545 Low back pain, unspecified: Secondary | ICD-10-CM | POA: Diagnosis not present

## 2021-05-13 ENCOUNTER — Ambulatory Visit
Admission: RE | Admit: 2021-05-13 | Discharge: 2021-05-13 | Disposition: A | Discharge status: Home / Self Care | Payer: Medicare Other | Source: Ambulatory Visit | Attending: Internal Medicine | Admitting: Internal Medicine

## 2021-05-13 DIAGNOSIS — R911 Solitary pulmonary nodule: Secondary | ICD-10-CM | POA: Diagnosis not present

## 2021-05-13 DIAGNOSIS — Z20822 Contact with and (suspected) exposure to covid-19: Secondary | ICD-10-CM | POA: Diagnosis not present

## 2021-05-31 DIAGNOSIS — G8929 Other chronic pain: Secondary | ICD-10-CM | POA: Diagnosis not present

## 2021-05-31 DIAGNOSIS — M47817 Spondylosis without myelopathy or radiculopathy, lumbosacral region: Secondary | ICD-10-CM | POA: Diagnosis not present

## 2021-05-31 DIAGNOSIS — M545 Low back pain, unspecified: Secondary | ICD-10-CM | POA: Diagnosis not present

## 2021-07-01 DIAGNOSIS — H04123 Dry eye syndrome of bilateral lacrimal glands: Secondary | ICD-10-CM | POA: Diagnosis not present

## 2021-07-01 DIAGNOSIS — H2189 Other specified disorders of iris and ciliary body: Secondary | ICD-10-CM | POA: Diagnosis not present

## 2021-07-02 DIAGNOSIS — L821 Other seborrheic keratosis: Secondary | ICD-10-CM | POA: Diagnosis not present

## 2021-07-02 DIAGNOSIS — L820 Inflamed seborrheic keratosis: Secondary | ICD-10-CM | POA: Diagnosis not present

## 2021-07-02 DIAGNOSIS — L814 Other melanin hyperpigmentation: Secondary | ICD-10-CM | POA: Diagnosis not present

## 2021-07-02 DIAGNOSIS — L570 Actinic keratosis: Secondary | ICD-10-CM | POA: Diagnosis not present

## 2021-07-02 DIAGNOSIS — Z85828 Personal history of other malignant neoplasm of skin: Secondary | ICD-10-CM | POA: Diagnosis not present

## 2021-07-04 DIAGNOSIS — M545 Low back pain, unspecified: Secondary | ICD-10-CM | POA: Diagnosis not present

## 2021-07-04 DIAGNOSIS — G8929 Other chronic pain: Secondary | ICD-10-CM | POA: Diagnosis not present

## 2021-07-04 DIAGNOSIS — M5459 Other low back pain: Secondary | ICD-10-CM | POA: Diagnosis not present

## 2021-07-15 DIAGNOSIS — R928 Other abnormal and inconclusive findings on diagnostic imaging of breast: Secondary | ICD-10-CM | POA: Diagnosis not present

## 2021-07-15 DIAGNOSIS — N644 Mastodynia: Secondary | ICD-10-CM | POA: Diagnosis not present

## 2021-07-18 ENCOUNTER — Ambulatory Visit
Admission: RE | Admit: 2021-07-18 | Discharge: 2021-07-18 | Disposition: A | Discharge status: Home / Self Care | Payer: Medicare Other | Source: Ambulatory Visit | Attending: Physician Assistant | Admitting: Physician Assistant

## 2021-07-18 ENCOUNTER — Other Ambulatory Visit: Payer: Self-pay | Admitting: Physician Assistant

## 2021-07-18 DIAGNOSIS — Z87440 Personal history of urinary (tract) infections: Secondary | ICD-10-CM | POA: Diagnosis not present

## 2021-07-18 DIAGNOSIS — R0789 Other chest pain: Secondary | ICD-10-CM | POA: Diagnosis not present

## 2021-07-18 DIAGNOSIS — M5412 Radiculopathy, cervical region: Secondary | ICD-10-CM | POA: Diagnosis not present

## 2021-07-18 DIAGNOSIS — R392 Extrarenal uremia: Secondary | ICD-10-CM | POA: Diagnosis not present

## 2021-07-18 DIAGNOSIS — R200 Anesthesia of skin: Secondary | ICD-10-CM

## 2021-08-01 DIAGNOSIS — R202 Paresthesia of skin: Secondary | ICD-10-CM | POA: Diagnosis not present

## 2021-08-01 DIAGNOSIS — M6281 Muscle weakness (generalized): Secondary | ICD-10-CM | POA: Diagnosis not present

## 2021-08-01 DIAGNOSIS — R3129 Other microscopic hematuria: Secondary | ICD-10-CM | POA: Diagnosis not present

## 2021-08-02 DIAGNOSIS — G8929 Other chronic pain: Secondary | ICD-10-CM | POA: Diagnosis not present

## 2021-08-02 DIAGNOSIS — M545 Low back pain, unspecified: Secondary | ICD-10-CM | POA: Diagnosis not present

## 2021-08-02 DIAGNOSIS — M47817 Spondylosis without myelopathy or radiculopathy, lumbosacral region: Secondary | ICD-10-CM | POA: Diagnosis not present

## 2021-08-05 DIAGNOSIS — K5792 Diverticulitis of intestine, part unspecified, without perforation or abscess without bleeding: Secondary | ICD-10-CM | POA: Diagnosis not present

## 2021-08-07 DIAGNOSIS — M6281 Muscle weakness (generalized): Secondary | ICD-10-CM | POA: Diagnosis not present

## 2021-08-07 DIAGNOSIS — R202 Paresthesia of skin: Secondary | ICD-10-CM | POA: Diagnosis not present

## 2021-08-13 DIAGNOSIS — R202 Paresthesia of skin: Secondary | ICD-10-CM | POA: Diagnosis not present

## 2021-08-13 DIAGNOSIS — M6281 Muscle weakness (generalized): Secondary | ICD-10-CM | POA: Diagnosis not present

## 2021-09-09 DIAGNOSIS — L820 Inflamed seborrheic keratosis: Secondary | ICD-10-CM | POA: Diagnosis not present

## 2021-09-09 DIAGNOSIS — Z85828 Personal history of other malignant neoplasm of skin: Secondary | ICD-10-CM | POA: Diagnosis not present

## 2021-09-13 DIAGNOSIS — M47816 Spondylosis without myelopathy or radiculopathy, lumbar region: Secondary | ICD-10-CM | POA: Diagnosis not present

## 2021-09-13 DIAGNOSIS — M545 Low back pain, unspecified: Secondary | ICD-10-CM | POA: Diagnosis not present

## 2021-09-13 DIAGNOSIS — M47817 Spondylosis without myelopathy or radiculopathy, lumbosacral region: Secondary | ICD-10-CM | POA: Diagnosis not present

## 2021-09-13 DIAGNOSIS — G8929 Other chronic pain: Secondary | ICD-10-CM | POA: Diagnosis not present

## 2021-09-25 DIAGNOSIS — Z23 Encounter for immunization: Secondary | ICD-10-CM | POA: Diagnosis not present

## 2021-10-08 DIAGNOSIS — Z7189 Other specified counseling: Secondary | ICD-10-CM | POA: Diagnosis not present

## 2021-10-08 DIAGNOSIS — I10 Essential (primary) hypertension: Secondary | ICD-10-CM | POA: Diagnosis not present

## 2021-10-08 DIAGNOSIS — F419 Anxiety disorder, unspecified: Secondary | ICD-10-CM | POA: Diagnosis not present

## 2021-10-08 DIAGNOSIS — M8580 Other specified disorders of bone density and structure, unspecified site: Secondary | ICD-10-CM | POA: Diagnosis not present

## 2021-10-08 DIAGNOSIS — K219 Gastro-esophageal reflux disease without esophagitis: Secondary | ICD-10-CM | POA: Diagnosis not present

## 2021-10-08 DIAGNOSIS — I700 Atherosclerosis of aorta: Secondary | ICD-10-CM | POA: Diagnosis not present

## 2021-10-08 DIAGNOSIS — E782 Mixed hyperlipidemia: Secondary | ICD-10-CM | POA: Diagnosis not present

## 2021-10-09 DIAGNOSIS — Z23 Encounter for immunization: Secondary | ICD-10-CM | POA: Diagnosis not present

## 2021-10-29 DIAGNOSIS — S0501XA Injury of conjunctiva and corneal abrasion without foreign body, right eye, initial encounter: Secondary | ICD-10-CM | POA: Diagnosis not present

## 2021-11-11 DIAGNOSIS — M5030 Other cervical disc degeneration, unspecified cervical region: Secondary | ICD-10-CM | POA: Diagnosis not present

## 2021-11-11 DIAGNOSIS — M542 Cervicalgia: Secondary | ICD-10-CM | POA: Diagnosis not present

## 2021-11-13 DIAGNOSIS — M542 Cervicalgia: Secondary | ICD-10-CM | POA: Diagnosis not present

## 2021-11-13 DIAGNOSIS — M5030 Other cervical disc degeneration, unspecified cervical region: Secondary | ICD-10-CM | POA: Diagnosis not present

## 2021-11-19 DIAGNOSIS — M542 Cervicalgia: Secondary | ICD-10-CM | POA: Diagnosis not present

## 2021-11-19 DIAGNOSIS — M5030 Other cervical disc degeneration, unspecified cervical region: Secondary | ICD-10-CM | POA: Diagnosis not present

## 2021-11-21 DIAGNOSIS — M542 Cervicalgia: Secondary | ICD-10-CM | POA: Diagnosis not present

## 2021-11-21 DIAGNOSIS — M5030 Other cervical disc degeneration, unspecified cervical region: Secondary | ICD-10-CM | POA: Diagnosis not present

## 2021-11-26 DIAGNOSIS — M542 Cervicalgia: Secondary | ICD-10-CM | POA: Diagnosis not present

## 2021-11-26 DIAGNOSIS — M5030 Other cervical disc degeneration, unspecified cervical region: Secondary | ICD-10-CM | POA: Diagnosis not present

## 2021-12-10 DIAGNOSIS — M5030 Other cervical disc degeneration, unspecified cervical region: Secondary | ICD-10-CM | POA: Diagnosis not present

## 2021-12-10 DIAGNOSIS — M542 Cervicalgia: Secondary | ICD-10-CM | POA: Diagnosis not present

## 2021-12-13 DIAGNOSIS — M542 Cervicalgia: Secondary | ICD-10-CM | POA: Diagnosis not present

## 2021-12-13 DIAGNOSIS — M5030 Other cervical disc degeneration, unspecified cervical region: Secondary | ICD-10-CM | POA: Diagnosis not present

## 2021-12-17 ENCOUNTER — Other Ambulatory Visit (HOSPITAL_COMMUNITY): Payer: Self-pay | Admitting: Internal Medicine

## 2021-12-17 DIAGNOSIS — R519 Headache, unspecified: Secondary | ICD-10-CM

## 2021-12-17 DIAGNOSIS — M5090 Cervical disc disorder, unspecified, unspecified cervical region: Secondary | ICD-10-CM | POA: Diagnosis not present

## 2021-12-18 ENCOUNTER — Other Ambulatory Visit (HOSPITAL_COMMUNITY): Payer: Self-pay | Admitting: Internal Medicine

## 2021-12-18 ENCOUNTER — Ambulatory Visit
Admission: RE | Admit: 2021-12-18 | Discharge: 2021-12-18 | Disposition: A | Discharge status: Home / Self Care | Payer: Medicare Other | Source: Ambulatory Visit | Attending: Internal Medicine | Admitting: Internal Medicine

## 2021-12-18 DIAGNOSIS — R519 Headache, unspecified: Secondary | ICD-10-CM

## 2021-12-19 DIAGNOSIS — M5030 Other cervical disc degeneration, unspecified cervical region: Secondary | ICD-10-CM | POA: Diagnosis not present

## 2021-12-19 DIAGNOSIS — M542 Cervicalgia: Secondary | ICD-10-CM | POA: Diagnosis not present

## 2021-12-30 DIAGNOSIS — M5030 Other cervical disc degeneration, unspecified cervical region: Secondary | ICD-10-CM | POA: Diagnosis not present

## 2021-12-30 DIAGNOSIS — M542 Cervicalgia: Secondary | ICD-10-CM | POA: Diagnosis not present

## 2022-01-01 DIAGNOSIS — L570 Actinic keratosis: Secondary | ICD-10-CM | POA: Diagnosis not present

## 2022-01-01 DIAGNOSIS — L814 Other melanin hyperpigmentation: Secondary | ICD-10-CM | POA: Diagnosis not present

## 2022-01-01 DIAGNOSIS — L821 Other seborrheic keratosis: Secondary | ICD-10-CM | POA: Diagnosis not present

## 2022-01-01 DIAGNOSIS — D1801 Hemangioma of skin and subcutaneous tissue: Secondary | ICD-10-CM | POA: Diagnosis not present

## 2022-01-01 DIAGNOSIS — L720 Epidermal cyst: Secondary | ICD-10-CM | POA: Diagnosis not present

## 2022-01-01 DIAGNOSIS — Z85828 Personal history of other malignant neoplasm of skin: Secondary | ICD-10-CM | POA: Diagnosis not present

## 2022-01-10 NOTE — Progress Notes (Signed)
This encounter was created in error - please disregard.

## 2022-01-15 DIAGNOSIS — M542 Cervicalgia: Secondary | ICD-10-CM | POA: Diagnosis not present

## 2022-01-15 DIAGNOSIS — M5030 Other cervical disc degeneration, unspecified cervical region: Secondary | ICD-10-CM | POA: Diagnosis not present

## 2022-01-22 DIAGNOSIS — H43812 Vitreous degeneration, left eye: Secondary | ICD-10-CM | POA: Diagnosis not present

## 2022-01-22 DIAGNOSIS — Z961 Presence of intraocular lens: Secondary | ICD-10-CM | POA: Diagnosis not present

## 2022-01-22 DIAGNOSIS — H52203 Unspecified astigmatism, bilateral: Secondary | ICD-10-CM | POA: Diagnosis not present

## 2022-01-27 DIAGNOSIS — H43812 Vitreous degeneration, left eye: Secondary | ICD-10-CM | POA: Diagnosis not present

## 2022-01-28 ENCOUNTER — Other Ambulatory Visit: Payer: Self-pay

## 2022-01-28 ENCOUNTER — Encounter (HOSPITAL_BASED_OUTPATIENT_CLINIC_OR_DEPARTMENT_OTHER): Payer: Self-pay | Admitting: Emergency Medicine

## 2022-01-28 ENCOUNTER — Ambulatory Visit (HOSPITAL_BASED_OUTPATIENT_CLINIC_OR_DEPARTMENT_OTHER)
Admission: EM | Admit: 2022-01-28 | Discharge: 2022-01-29 | Disposition: A | Discharge status: Home / Self Care | Payer: Medicare Other | Attending: General Surgery | Admitting: General Surgery

## 2022-01-28 ENCOUNTER — Emergency Department (HOSPITAL_BASED_OUTPATIENT_CLINIC_OR_DEPARTMENT_OTHER): Payer: Medicare Other

## 2022-01-28 DIAGNOSIS — E7800 Pure hypercholesterolemia, unspecified: Secondary | ICD-10-CM | POA: Insufficient documentation

## 2022-01-28 DIAGNOSIS — K353 Acute appendicitis with localized peritonitis, without perforation or gangrene: Secondary | ICD-10-CM | POA: Diagnosis not present

## 2022-01-28 DIAGNOSIS — Z79899 Other long term (current) drug therapy: Secondary | ICD-10-CM | POA: Diagnosis not present

## 2022-01-28 DIAGNOSIS — I700 Atherosclerosis of aorta: Secondary | ICD-10-CM | POA: Insufficient documentation

## 2022-01-28 DIAGNOSIS — R109 Unspecified abdominal pain: Secondary | ICD-10-CM | POA: Diagnosis not present

## 2022-01-28 DIAGNOSIS — K449 Diaphragmatic hernia without obstruction or gangrene: Secondary | ICD-10-CM | POA: Insufficient documentation

## 2022-01-28 DIAGNOSIS — K219 Gastro-esophageal reflux disease without esophagitis: Secondary | ICD-10-CM | POA: Diagnosis not present

## 2022-01-28 DIAGNOSIS — Z87891 Personal history of nicotine dependence: Secondary | ICD-10-CM | POA: Insufficient documentation

## 2022-01-28 DIAGNOSIS — K3580 Unspecified acute appendicitis: Secondary | ICD-10-CM

## 2022-01-28 DIAGNOSIS — K381 Appendicular concretions: Secondary | ICD-10-CM | POA: Diagnosis not present

## 2022-01-28 DIAGNOSIS — I10 Essential (primary) hypertension: Secondary | ICD-10-CM | POA: Insufficient documentation

## 2022-01-28 LAB — COMPREHENSIVE METABOLIC PANEL
ALT: 18 U/L (ref 0–44)
AST: 19 U/L (ref 15–41)
Albumin: 4.9 g/dL (ref 3.5–5.0)
Alkaline Phosphatase: 91 U/L (ref 38–126)
Anion gap: 11 (ref 5–15)
BUN: 15 mg/dL (ref 8–23)
CO2: 28 mmol/L (ref 22–32)
Calcium: 10 mg/dL (ref 8.9–10.3)
Chloride: 101 mmol/L (ref 98–111)
Creatinine, Ser: 0.83 mg/dL (ref 0.44–1.00)
GFR, Estimated: >60 mL/min (ref >60)
Glucose, Bld: 104 mg/dL — ABNORMAL HIGH (ref 70–99)
Potassium: 3.9 mmol/L (ref 3.5–5.1)
Sodium: 140 mmol/L (ref 135–145)
Total Bilirubin: 0.6 mg/dL (ref 0.3–1.2)
Total Protein: 7.8 g/dL (ref 6.5–8.1)

## 2022-01-28 LAB — CBC
HCT: 44.9 % (ref 36.0–46.0)
Hemoglobin: 15.5 g/dL — ABNORMAL HIGH (ref 12.0–15.0)
MCH: 32 pg (ref 26.0–34.0)
MCHC: 34.5 g/dL (ref 30.0–36.0)
MCV: 92.8 fL (ref 80.0–100.0)
Platelets: 289 10*3/uL (ref 150–400)
RBC: 4.84 MIL/uL (ref 3.87–5.11)
RDW: 12 % (ref 11.5–15.5)
WBC: 17.7 10*3/uL — ABNORMAL HIGH (ref 4.0–10.5)
nRBC: 0 % (ref 0.0–0.2)

## 2022-01-28 LAB — LIPASE, BLOOD: Lipase: 22 U/L (ref 11–51)

## 2022-01-28 MED ORDER — MORPHINE SULFATE (PF) 4 MG/ML IV SOLN
4.0000 mg | Freq: Once | INTRAVENOUS | Status: AC
  Administered 2022-01-28: 4 mg via INTRAVENOUS
  Filled 2022-01-28: qty 1

## 2022-01-28 MED ORDER — ONDANSETRON HCL 4 MG/2ML IJ SOLN
4.0000 mg | Freq: Once | INTRAMUSCULAR | Status: AC
  Administered 2022-01-28: 4 mg via INTRAVENOUS
  Filled 2022-01-28: qty 2

## 2022-01-28 MED ORDER — METRONIDAZOLE 500 MG/100ML IV SOLN
500.0000 mg | Freq: Once | INTRAVENOUS | Status: AC
  Administered 2022-01-28: 500 mg via INTRAVENOUS
  Filled 2022-01-28: qty 100

## 2022-01-28 MED ORDER — IOHEXOL 300 MG/ML  SOLN
100.0000 mL | Freq: Once | INTRAMUSCULAR | Status: AC | PRN
  Administered 2022-01-28: 80 mL via INTRAVENOUS

## 2022-01-28 MED ORDER — SODIUM CHLORIDE 0.9 % IV BOLUS
1000.0000 mL | Freq: Once | INTRAVENOUS | Status: AC
  Administered 2022-01-28: 1000 mL via INTRAVENOUS

## 2022-01-28 MED ORDER — ONDANSETRON HCL 4 MG/2ML IJ SOLN
4.0000 mg | Freq: Four times a day (QID) | INTRAMUSCULAR | Status: DC | PRN
  Administered 2022-01-28 – 2022-01-29 (×2): 4 mg via INTRAVENOUS
  Filled 2022-01-28 (×2): qty 2

## 2022-01-28 MED ORDER — SODIUM CHLORIDE 0.9 % IV SOLN
1.0000 g | Freq: Once | INTRAVENOUS | Status: AC
  Administered 2022-01-28: 1 g via INTRAVENOUS
  Filled 2022-01-28: qty 10

## 2022-01-28 MED ORDER — MORPHINE SULFATE (PF) 4 MG/ML IV SOLN
4.0000 mg | INTRAVENOUS | Status: DC | PRN
  Administered 2022-01-28 – 2022-01-29 (×3): 4 mg via INTRAVENOUS
  Filled 2022-01-28 (×4): qty 1

## 2022-01-28 NOTE — ED Triage Notes (Signed)
Abdominal pain woke from sleep.  Able to go back to sleep. Today pain as gotten worse. Reports burning type feeling up middle abdomen Not relieved with maalox, omeprazole  Hx of GERD

## 2022-01-28 NOTE — ED Notes (Signed)
Following pain medication administration, patient's SpO2 maintaining 88%. RT instructed patient on deep breathing. SpO2 recovers momentarily, then falls back to high 80's. Patient placed on [facility].

## 2022-01-28 NOTE — ED Provider Notes (Signed)
[facility] [place] EMERGENCY DEPT Provider Note   CSN: [id number] Arrival date & time: 01/28/22  1701     History  Chief Complaint  Patient presents with   Abdominal Pain    Tammy Weeks is a 67 y.o. female.  Patient is a 67 year old female presenting for abdominal pain.  Patient admits to generalized abdominal pain that awoke her from sleep this morning.  Admits to nausea without vomiting.  Denies diarrhea.  Denies black or bloody stools.  Denies fevers or chills.  Abdominal surgical history pertinent for hysterectomy only.  The history is provided by the patient. No language interpreter was used.  Abdominal Pain Associated symptoms: nausea   Associated symptoms: no chest pain, no chills, no constipation, no cough, no diarrhea, no dysuria, no fever, no hematuria, no shortness of breath, no sore throat and no vomiting        Home Medications Prior to Admission medications   Medication Sig Start Date End Date Taking? Authorizing Provider  ALPRAZolam Duanne Moron) 0.5 MG tablet take one by mouth daily as needed for anxiety 10/31/14   [provider]  atorvastatin (LIPITOR) 20 MG tablet take one by mouth daily 10/23/14   [provider]  hydrochlorothiazide (MICROZIDE) 12.5 MG capsule Take 12.5 mg by mouth every morning. 06/20/19   [provider]  losartan (COZAAR) 50 MG tablet Take 50 mg by mouth daily. 11/15/18   [provider]  omeprazole (PRILOSEC) 40 MG capsule Take 40 mg by mouth as needed.  12/26/14   [provider]  traMADol (ULTRAM) 50 MG tablet Take 1 tablet (50 mg total) by mouth every 6 (six) hours as needed. 03/05/21   Daryll Brod, MD      Allergies    Ciprofloxacin, Codeine, Oxycodone, Quinolones, and Sulfa antibiotics    Review of Systems   Review of Systems  Constitutional:  Negative for chills and fever.  HENT:  Negative for ear pain and sore throat.   Eyes:  Negative for pain and visual disturbance.   Respiratory:  Negative for cough and shortness of breath.   Cardiovascular:  Negative for chest pain and palpitations.  Gastrointestinal:  Positive for abdominal pain and nausea. Negative for constipation, diarrhea and vomiting.  Genitourinary:  Negative for dysuria and hematuria.  Musculoskeletal:  Negative for arthralgias and back pain.  Skin:  Negative for color change and rash.  Neurological:  Negative for seizures and syncope.  All other systems reviewed and are negative.   Physical Exam Updated Vital Signs BP (!) 147/85   Pulse 66   Temp 97.6 F (36.4 C)   Resp 18   SpO2 91%  Physical Exam Vitals and nursing note reviewed.  Constitutional:      General: She is not in acute distress.    Appearance: She is well-developed.  HENT:     Head: Normocephalic and atraumatic.  Eyes:     Conjunctiva/sclera: Conjunctivae normal.  Cardiovascular:     Rate and Rhythm: Normal rate and regular rhythm.     Heart sounds: No murmur heard. Pulmonary:     Effort: Pulmonary effort is normal. No respiratory distress.     Breath sounds: Normal breath sounds.  Abdominal:     Palpations: Abdomen is soft.     Tenderness: There is generalized abdominal tenderness. There is no guarding or rebound.  Musculoskeletal:        General: No swelling.     Cervical back: Neck supple.  Skin:    General: Skin is  warm and dry.     Capillary Refill: Capillary refill takes less than 2 seconds.  Neurological:     Mental Status: She is alert.  Psychiatric:        Mood and Affect: Mood normal.     ED Results / Procedures / Treatments   Labs (all labs ordered are listed, but only abnormal results are displayed) Labs Reviewed  COMPREHENSIVE METABOLIC PANEL - Abnormal; Notable for the following components:      Result Value   Glucose, Bld 104 (*)    All other components within normal limits  CBC - Abnormal; Notable for the following components:   WBC 17.7 (*)    Hemoglobin 15.5 (*)    All other  components within normal limits  LIPASE, BLOOD  URINALYSIS, ROUTINE W REFLEX MICROSCOPIC    EKG None  Radiology CT ABDOMEN PELVIS W CONTRAST  Result Date: 01/28/2022 CLINICAL DATA:  Acute nonlocalized abdominal pain. EXAM: CT ABDOMEN AND PELVIS WITH CONTRAST TECHNIQUE: Multidetector CT imaging of the abdomen and pelvis was performed using the standard protocol following bolus administration of intravenous contrast. RADIATION DOSE REDUCTION: This exam was performed according to the departmental dose-optimization program which includes automated exposure control, adjustment of the mA and/or kV according to patient size and/or use of iterative reconstruction technique. CONTRAST:  41m OMNIPAQUE IOHEXOL 300 MG/ML  SOLN COMPARISON:  CT 04/16/2012.  CT colonography 07/15/2012 FINDINGS: Lower chest: Lung bases are clear.  Small esophageal hiatal hernia. Hepatobiliary: No focal liver abnormality is seen. No gallstones, gallbladder wall thickening, or biliary dilatation. Pancreas: Unremarkable. No pancreatic ductal dilatation or surrounding inflammatory changes. Spleen: Normal in size without focal abnormality. Adrenals/Urinary Tract: Surgical absence of the right adrenal gland. Left adrenal gland appears normal. Kidneys, ureters, and the bladder are unremarkable. Stomach/Bowel: Stomach, small bowel, and colon are not abnormally distended. No wall thickening or inflammatory changes are seen. The appendix is distended and fluid-filled with appendiceal diameter measuring 1.4 cm. There is an appendicolith at the base of the appendix. Mild stranding around the appendix. No loculated collections. Vascular/Lymphatic: Aortic atherosclerosis. No enlarged abdominal or pelvic lymph nodes. Reproductive: Status post hysterectomy. No adnexal masses. Other: No free air or free fluid in the abdomen. Minimal periumbilical hernias containing fat. Musculoskeletal: Degenerative changes in the spine. No destructive bone lesions.  IMPRESSION: 1. Acute appendicitis with distended fluid-filled appendix. Appendicolith is present. No abscess. 2. No evidence of bowel obstruction or inflammation. 3. Aortic atherosclerosis. Electronically Signed   By: WLucienne CapersM.D.   On: 01/28/2022 20:04    Procedures Procedures    Medications Ordered in ED Medications  cefTRIAXone (ROCEPHIN) 1 g in sodium chloride 0.9 % 100 mL IVPB (has no administration in time range)  metroNIDAZOLE (FLAGYL) IVPB 500 mg (has no administration in time range)  morphine (PF) 4 MG/ML injection 4 mg (4 mg Intravenous Given 01/28/22 1932)  ondansetron (ZOFRAN) injection 4 mg (4 mg Intravenous Given 01/28/22 1930)  sodium chloride 0.9 % bolus 1,000 mL (1,000 mLs Intravenous New Bag/Given 01/28/22 1936)  iohexol (OMNIPAQUE) 300 MG/ML solution 100 mL (80 mLs Intravenous Contrast Given 01/28/22 1947)    ED Course/ Medical Decision Making/ A&P                             Medical Decision Making Amount and/or Complexity of Data Reviewed Labs: ordered. Radiology: ordered.  Risk Prescription drug management. Decision regarding hospitalization.   [id number]  67year old female  presenting for abdominal pain.  She is alert oriented x 3, no acute distress, afebrile, some vital signs.  Physical exam demonstrates generalized abdominal tenderness.  Some focal tenderness in the right upper quadrant and epigastric regions.  IV fluids, Zofran, morphine given for symptomatic management of symptoms.  Laboratory studies as interpreted by myself demonstrates leukocytosis.  Stable liver profile, bilirubins, lipase, and renal function.  CT abdomen and pelvis pending.  8:46 PM CT abdomen and pelvis demonstrates acute appenditis. Rocephin and flagyl ordered.  I spoke with general surgeon Dr. Bobbye Morton who recommends keeping patient at [facility] overnight with IV antibiotics and will arrange for OR tomorrow morning. States OR will call us for transport when they are ready.           Final Clinical Impression(s) / ED Diagnoses Final diagnoses:  Acute appendicitis, unspecified acute appendicitis type    Rx / DC Orders ED Discharge Orders     None         Lianne Cure, DO 03/00/92 2046

## 2022-01-29 ENCOUNTER — Encounter (HOSPITAL_COMMUNITY)
Admission: EM | Disposition: A | Discharge status: Home / Self Care | Payer: Self-pay | Source: Home / Self Care | Attending: Emergency Medicine

## 2022-01-29 ENCOUNTER — Other Ambulatory Visit: Payer: Self-pay

## 2022-01-29 ENCOUNTER — Ambulatory Visit: Admit: 2022-01-29 | Payer: Medicare Other | Admitting: General Surgery

## 2022-01-29 ENCOUNTER — Emergency Department (HOSPITAL_COMMUNITY): Payer: Medicare Other | Admitting: Certified Registered"

## 2022-01-29 ENCOUNTER — Emergency Department (EMERGENCY_DEPARTMENT_HOSPITAL): Payer: Medicare Other | Admitting: Certified Registered"

## 2022-01-29 ENCOUNTER — Encounter (HOSPITAL_BASED_OUTPATIENT_CLINIC_OR_DEPARTMENT_OTHER): Payer: Self-pay | Admitting: General Surgery

## 2022-01-29 DIAGNOSIS — E7800 Pure hypercholesterolemia, unspecified: Secondary | ICD-10-CM | POA: Diagnosis not present

## 2022-01-29 DIAGNOSIS — I700 Atherosclerosis of aorta: Secondary | ICD-10-CM | POA: Diagnosis not present

## 2022-01-29 DIAGNOSIS — K219 Gastro-esophageal reflux disease without esophagitis: Secondary | ICD-10-CM | POA: Diagnosis not present

## 2022-01-29 DIAGNOSIS — K3580 Unspecified acute appendicitis: Secondary | ICD-10-CM

## 2022-01-29 DIAGNOSIS — K381 Appendicular concretions: Secondary | ICD-10-CM | POA: Diagnosis not present

## 2022-01-29 DIAGNOSIS — K353 Acute appendicitis with localized peritonitis, without perforation or gangrene: Secondary | ICD-10-CM | POA: Diagnosis not present

## 2022-01-29 DIAGNOSIS — I10 Essential (primary) hypertension: Secondary | ICD-10-CM | POA: Diagnosis not present

## 2022-01-29 DIAGNOSIS — Z79899 Other long term (current) drug therapy: Secondary | ICD-10-CM | POA: Diagnosis not present

## 2022-01-29 DIAGNOSIS — K449 Diaphragmatic hernia without obstruction or gangrene: Secondary | ICD-10-CM | POA: Diagnosis not present

## 2022-01-29 DIAGNOSIS — Z87891 Personal history of nicotine dependence: Secondary | ICD-10-CM | POA: Diagnosis not present

## 2022-01-29 HISTORY — PX: LAPAROSCOPIC APPENDECTOMY: SHX408

## 2022-01-29 LAB — URINALYSIS, ROUTINE W REFLEX MICROSCOPIC
Glucose, UA: 100 mg/dL — AB
Ketones, ur: 40 mg/dL — AB
Leukocytes,Ua: NEGATIVE
Nitrite: POSITIVE — AB
Protein, ur: 30 mg/dL — AB
Specific Gravity, Urine: >1.03 — ABNORMAL HIGH (ref 1.005–1.030)
pH: 6 (ref 5.0–8.0)

## 2022-01-29 LAB — URINALYSIS, MICROSCOPIC (REFLEX)

## 2022-01-29 SURGERY — APPENDECTOMY, LAPAROSCOPIC
Anesthesia: General

## 2022-01-29 MED ORDER — METRONIDAZOLE 500 MG/100ML IV SOLN
500.0000 mg | Freq: Three times a day (TID) | INTRAVENOUS | Status: DC
  Administered 2022-01-29: 500 mg via INTRAVENOUS
  Filled 2022-01-29: qty 100

## 2022-01-29 MED ORDER — ROCURONIUM BROMIDE 10 MG/ML (PF) SYRINGE
PREFILLED_SYRINGE | INTRAVENOUS | Status: DC | PRN
  Administered 2022-01-29: 50 mg via INTRAVENOUS

## 2022-01-29 MED ORDER — FENTANYL CITRATE (PF) 100 MCG/2ML IJ SOLN
INTRAMUSCULAR | Status: AC
  Filled 2022-01-29: qty 2

## 2022-01-29 MED ORDER — ORAL CARE MOUTH RINSE: 15.0000 mL | Freq: Once | OROMUCOSAL | Status: AC

## 2022-01-29 MED ORDER — SUGAMMADEX SODIUM 200 MG/2ML IV SOLN
INTRAVENOUS | Status: DC | PRN
  Administered 2022-01-29: 175 mg via INTRAVENOUS

## 2022-01-29 MED ORDER — LIDOCAINE 2% (20 MG/ML) 5 ML SYRINGE
INTRAMUSCULAR | Status: DC | PRN
  Administered 2022-01-29: 80 mg via INTRAVENOUS

## 2022-01-29 MED ORDER — LACTATED RINGERS IV SOLN: INTRAVENOUS | Status: DC

## 2022-01-29 MED ORDER — ACETAMINOPHEN 500 MG PO TABS: 1000.0000 mg | ORAL_TABLET | Freq: Four times a day (QID) | ORAL | 2 refills | Status: AC | PRN

## 2022-01-29 MED ORDER — ONDANSETRON HCL 4 MG/2ML IJ SOLN
INTRAMUSCULAR | Status: DC | PRN
  Administered 2022-01-29: 4 mg via INTRAVENOUS

## 2022-01-29 MED ORDER — FENTANYL CITRATE (PF) 100 MCG/2ML IJ SOLN
INTRAMUSCULAR | Status: DC | PRN
  Administered 2022-01-29 (×2): 50 ug via INTRAVENOUS

## 2022-01-29 MED ORDER — IBUPROFEN 200 MG PO TABS: 400.0000 mg | ORAL_TABLET | Freq: Three times a day (TID) | ORAL | 2 refills | Status: AC | PRN

## 2022-01-29 MED ORDER — MIDAZOLAM HCL 2 MG/2ML IJ SOLN
INTRAMUSCULAR | Status: AC
  Filled 2022-01-29: qty 2

## 2022-01-29 MED ORDER — TRAMADOL HCL 50 MG PO TABS: 50.0000 mg | ORAL_TABLET | Freq: Four times a day (QID) | ORAL | 0 refills | Status: DC | PRN

## 2022-01-29 MED ORDER — DEXAMETHASONE SODIUM PHOSPHATE 10 MG/ML IJ SOLN
INTRAMUSCULAR | Status: AC
  Filled 2022-01-29: qty 1

## 2022-01-29 MED ORDER — PROPOFOL 10 MG/ML IV BOLUS
INTRAVENOUS | Status: DC | PRN
  Administered 2022-01-29: 140 mg via INTRAVENOUS

## 2022-01-29 MED ORDER — ONDANSETRON HCL 4 MG/2ML IJ SOLN: 4.0000 mg | Freq: Once | INTRAMUSCULAR | Status: DC | PRN

## 2022-01-29 MED ORDER — BUPIVACAINE-EPINEPHRINE 0.5% -1:200000 IJ SOLN
INTRAMUSCULAR | Status: DC | PRN
  Administered 2022-01-29: 15 mL

## 2022-01-29 MED ORDER — METRONIDAZOLE 500 MG/100ML IV SOLN
500.0000 mg | Freq: Two times a day (BID) | INTRAVENOUS | Status: DC
  Filled 2022-01-29: qty 100

## 2022-01-29 MED ORDER — SUCCINYLCHOLINE CHLORIDE 200 MG/10ML IV SOSY
PREFILLED_SYRINGE | INTRAVENOUS | Status: AC
  Filled 2022-01-29: qty 10

## 2022-01-29 MED ORDER — PROPOFOL 10 MG/ML IV BOLUS
INTRAVENOUS | Status: AC
  Filled 2022-01-29: qty 20

## 2022-01-29 MED ORDER — MIDAZOLAM HCL 5 MG/5ML IJ SOLN
INTRAMUSCULAR | Status: DC | PRN
  Administered 2022-01-29: 2 mg via INTRAVENOUS

## 2022-01-29 MED ORDER — LIDOCAINE HCL (PF) 2 % IJ SOLN
INTRAMUSCULAR | Status: AC
  Filled 2022-01-29: qty 5

## 2022-01-29 MED ORDER — ROCURONIUM BROMIDE 10 MG/ML (PF) SYRINGE
PREFILLED_SYRINGE | INTRAVENOUS | Status: AC
  Filled 2022-01-29: qty 10

## 2022-01-29 MED ORDER — DEXAMETHASONE SODIUM PHOSPHATE 10 MG/ML IJ SOLN
INTRAMUSCULAR | Status: DC | PRN
  Administered 2022-01-29: 10 mg via INTRAVENOUS

## 2022-01-29 MED ORDER — ONDANSETRON HCL 4 MG/2ML IJ SOLN
INTRAMUSCULAR | Status: AC
  Filled 2022-01-29: qty 2

## 2022-01-29 MED ORDER — 0.9 % SODIUM CHLORIDE (POUR BTL) OPTIME
TOPICAL | Status: DC | PRN
  Administered 2022-01-29: 1000 mL

## 2022-01-29 MED ORDER — CHLORHEXIDINE GLUCONATE 0.12 % MT SOLN
15.0000 mL | Freq: Once | OROMUCOSAL | Status: AC
  Administered 2022-01-29: 15 mL via OROMUCOSAL

## 2022-01-29 MED ORDER — KETOROLAC TROMETHAMINE 30 MG/ML IJ SOLN: 30.0000 mg | Freq: Once | INTRAMUSCULAR | Status: DC | PRN

## 2022-01-29 MED ORDER — FENTANYL CITRATE PF 50 MCG/ML IJ SOSY: 25.0000 ug | PREFILLED_SYRINGE | INTRAMUSCULAR | Status: DC | PRN

## 2022-01-29 SURGICAL SUPPLY — 39 items
ADH SKN CLS APL DERMABOND .7 (GAUZE/BANDAGES/DRESSINGS) ×1
APL PRP STRL LF DISP 70% ISPRP (MISCELLANEOUS) ×1
APPLIER CLIP 5 13 M/L LIGAMAX5 (MISCELLANEOUS)
APPLIER CLIP ROT 10 11.4 M/L (STAPLE)
APR CLP MED LRG 11.4X10 (STAPLE)
APR CLP MED LRG 5 ANG JAW (MISCELLANEOUS)
BAG COUNTER SPONGE SURGICOUNT (BAG) IMPLANT
BAG SPNG CNTER NS LX DISP (BAG)
CHLORAPREP W/TINT 26 (MISCELLANEOUS) ×2 IMPLANT
CLIP APPLIE 5 13 M/L LIGAMAX5 (MISCELLANEOUS) IMPLANT
CLIP APPLIE ROT 10 11.4 M/L (STAPLE) IMPLANT
CLIP LIGATING HEMO LOK XL GOLD (MISCELLANEOUS) ×2 IMPLANT
COVER TRANSDUCER ULTRASND (DRAPES) ×2 IMPLANT
DERMABOND ADVANCED .7 DNX12 (GAUZE/BANDAGES/DRESSINGS) ×2 IMPLANT
DEVICE TROCAR PUNCTURE CLOSURE (ENDOMECHANICALS) ×2 IMPLANT
ELECT REM PT RETURN 15FT ADLT (MISCELLANEOUS) ×2 IMPLANT
ENDOLOOP SUT PDS II  0 18 (SUTURE)
ENDOLOOP SUT PDS II 0 18 (SUTURE) IMPLANT
GLOVE BIO SURGEON STRL SZ7.5 (GLOVE) ×2 IMPLANT
GOWN STRL REUS W/ TWL XL LVL3 (GOWN DISPOSABLE) ×4 IMPLANT
GOWN STRL REUS W/TWL XL LVL3 (GOWN DISPOSABLE) ×2
IRRIG SUCT STRYKERFLOW 2 WTIP (MISCELLANEOUS) ×1
IRRIGATION SUCT STRKRFLW 2 WTP (MISCELLANEOUS) ×2 IMPLANT
KIT BASIN OR (CUSTOM PROCEDURE TRAY) ×2 IMPLANT
KIT TURNOVER KIT A (KITS) IMPLANT
NDL INSUFFLATION 14GA 120MM (NEEDLE) ×2 IMPLANT
NEEDLE INSUFFLATION 14GA 120MM (NEEDLE) ×1 IMPLANT
PENCIL SMOKE EVACUATOR (MISCELLANEOUS) IMPLANT
SCISSORS LAP 5X35 DISP (ENDOMECHANICALS) ×2 IMPLANT
SET TUBE SMOKE EVAC HIGH FLOW (TUBING) ×2 IMPLANT
SLEEVE Z-THREAD 5X100MM (TROCAR) ×2 IMPLANT
SPIKE FLUID TRANSFER (MISCELLANEOUS) ×2 IMPLANT
SUT MNCRL AB 4-0 PS2 18 (SUTURE) ×2 IMPLANT
TOWEL OR 17X26 10 PK STRL BLUE (TOWEL DISPOSABLE) ×2 IMPLANT
TOWEL OR NON WOVEN STRL DISP B (DISPOSABLE) ×2 IMPLANT
TRAY FOLEY MTR SLVR 16FR STAT (SET/KITS/TRAYS/PACK) IMPLANT
TRAY LAPAROSCOPIC (CUSTOM PROCEDURE TRAY) ×2 IMPLANT
TROCAR 11X100 Z THREAD (TROCAR) ×2 IMPLANT
TROCAR Z-THREAD OPTICAL 5X100M (TROCAR) ×2 IMPLANT

## 2022-01-29 NOTE — Anesthesia Postprocedure Evaluation (Signed)
Anesthesia Post Note  Patient: Tammy Weeks [name]  Procedure(s) Performed: APPENDECTOMY LAPAROSCOPIC     Patient location during evaluation: PACU Anesthesia Type: General Level of consciousness: awake and alert Pain management: pain level controlled Vital Signs Assessment: post-procedure vital signs reviewed and stable Respiratory status: spontaneous breathing, nonlabored ventilation, respiratory function stable and patient connected to nasal cannula oxygen Cardiovascular status: blood pressure returned to baseline and stable Postop Assessment: no apparent nausea or vomiting Anesthetic complications: no  No notable events documented.  Last Vitals:  Vitals:   01/29/22 1100 01/29/22 1116  BP: (!) 142/123 118/86  Pulse: 69 75  Resp: (!) 26 19  Temp:    SpO2: 94% 90%    Last Pain:  Vitals:   01/29/22 1125  TempSrc:   PainSc: 0-No pain                 Shima Compere S

## 2022-01-29 NOTE — Transfer of Care (Signed)
Immediate Anesthesia Transfer of Care Note  Patient: Tammy Weeks [facility]  Procedure(s) Performed: APPENDECTOMY LAPAROSCOPIC  Patient Location: PACU  Anesthesia Type:General  Level of Consciousness: awake, alert , and oriented  Airway & Oxygen Therapy: Patient Spontanous Breathing and Patient connected to face mask oxygen  Post-op Assessment: Report given to RN and Post -op Vital signs reviewed and stable  Post vital signs: Reviewed and stable  Last Vitals:  Vitals Value Taken Time  BP 135/86 01/29/22 1054  Temp    Pulse 73 01/29/22 1056  Resp 17 01/29/22 1056  SpO2 98 % 01/29/22 1056  Vitals shown include unvalidated device data.  Last Pain:  Vitals:   01/29/22 0738  TempSrc:   PainSc: 0-No pain         Complications: No notable events documented.

## 2022-01-29 NOTE — Discharge Instructions (Signed)
[facility]  Please arrive at least 30 min before your appointment to complete your check in paperwork.  If you are unable to arrive 30 min prior to your appointment time we may have to cancel or reschedule you. LAPAROSCOPIC SURGERY: POST OP INSTRUCTIONS Always review your discharge instruction sheet given to you by the facility where your surgery was performed. IF YOU HAVE DISABILITY OR FAMILY LEAVE FORMS, YOU MUST BRING THEM TO THE OFFICE FOR PROCESSING.   DO NOT GIVE THEM TO YOUR DOCTOR.  PAIN CONTROL  First take acetaminophen (Tylenol) AND/or ibuprofen (Advil) to control your pain after surgery.  Follow directions on package.  Taking acetaminophen (Tylenol) and/or ibuprofen (Advil) regularly after surgery will help to control your pain and lower the amount of prescription pain medication you may need.  You should not take more than 4,000 mg (4 grams) of acetaminophen (Tylenol) in 24 hours.  You should not take ibuprofen (Advil), aleve, motrin, naprosyn or other NSAIDS if you have a history of stomach ulcers or chronic kidney disease.  A prescription for pain medication may be given to you upon discharge.  Take your pain medication as prescribed, if you still have uncontrolled pain after taking acetaminophen (Tylenol) or ibuprofen (Advil). Use ice packs to help control pain. If you need a refill on your pain medication, please contact your pharmacy.  They will contact our office to request authorization. Prescriptions will not be filled after 5pm or on week-ends.  HOME MEDICATIONS Take your usually prescribed medications unless otherwise directed.  DIET You should follow a light diet the first few days after arrival home.  Be sure to include lots of fluids daily. Avoid fatty, fried foods.   CONSTIPATION It is common to experience some constipation after surgery and if you are taking pain medication.  Increasing fluid intake and taking a stool softener (such as Colace)  will usually help or prevent this problem from occurring.  A mild laxative (Milk of Magnesia or Miralax) should be taken according to package instructions if there are no bowel movements after 48 hours.  WOUND/INCISION CARE Most patients will experience some swelling and bruising in the area of the incisions.  Ice packs will help.  Swelling and bruising can take several days to resolve.  Unless discharge instructions indicate otherwise, follow guidelines below  STERI-STRIPS - you may remove your outer bandages 48 hours after surgery, and you may shower at that time.  You have steri-strips (small skin tapes) in place directly over the incision.  These strips should be left on the skin for 7-10 days.   DERMABOND/SKIN GLUE - you may shower in 24 hours.  The glue will flake off over the next 2-3 weeks. Any sutures or staples will be removed at the office during your follow-up visit.  ACTIVITIES You may resume regular (light) daily activities beginning the next day--such as daily self-care, walking, climbing stairs--gradually increasing activities as tolerated.  You may have sexual intercourse when it is comfortable.  Refrain from any heavy lifting or straining until approved by your doctor. You may drive when you are no longer taking prescription pain medication, you can comfortably wear a seatbelt, and you can safely maneuver your car and apply brakes.  FOLLOW-UP You should see your doctor in the office for a follow-up appointment approximately 2-3 weeks after your surgery.  You should have been given your post-op/follow-up appointment when your surgery was scheduled.  If you did not receive a post-op/follow-up appointment, make sure   that you call for this appointment within a day or two after you arrive home to insure a convenient appointment time. ° ° °WHEN TO CALL YOUR DOCTOR: °Fever over 101.0 °Inability to urinate °Continued bleeding from incision. °Increased pain, redness, or drainage from the  incision. °Increasing abdominal pain ° °The clinic staff is available to answer your questions during regular business hours.  Please don’t hesitate to call and ask to speak to one of the nurses for clinical concerns.  If you have a medical emergency, go to the nearest emergency room or call 911.  A surgeon from [facility] is always on call at the hospital. °[address], [address], [place], [place]  [address] ? [address] [address], [place], [place]   [address] °[phone] ? [phone] ? FAX [phone] ° ° ° ° °Managing Your Pain After Surgery Without Opioids ° ° ° °Thank you for participating in our program to help patients manage their pain after surgery without opioids. This is part of our effort to provide you with the best care possible, without exposing you or your family to the risk that opioids pose. ° °What pain can I expect after surgery? °You can expect to have some pain after surgery. This is normal. The pain is typically worse the day after surgery, and quickly begins to get better. °Many studies have found that many patients are able to manage their pain after surgery with Over-the-Counter (OTC) medications such as Tylenol and Motrin. If you have a condition that does not allow you to take Tylenol or Motrin, notify your surgical team. ° °How will I manage my pain? °The best strategy for controlling your pain after surgery is around the clock pain control with Tylenol (acetaminophen) and Motrin (ibuprofen or Advil). Alternating these medications with each other allows you to maximize your pain control. In addition to Tylenol and Motrin, you can use heating pads or ice packs on your incisions to help reduce your pain. ° °How will I alternate your regular strength over-the-counter pain medication? °You will take a dose of pain medication every three hours. °Start by taking 650 mg of Tylenol (2 pills of 325 mg) °3 hours later take 600 mg of Motrin (3 pills of 200 mg) °3 hours after  taking the Motrin take 650 mg of Tylenol °3 hours after that take 600 mg of Motrin. ° ° °- 1 - ° °See example - if your first dose of Tylenol is at 12:00 PM ° ° °12:00 PM Tylenol 650 mg (2 pills of 325 mg)  °3:00 PM Motrin 600 mg (3 pills of 200 mg)  °6:00 PM Tylenol 650 mg (2 pills of 325 mg)  °9:00 PM Motrin 600 mg (3 pills of 200 mg)  °Continue alternating every 3 hours  ° °We recommend that you follow this schedule around-the-clock for at least 3 days after surgery, or until you feel that it is no longer needed. Use the table on the last page of this handout to keep track of the medications you are taking. °Important: °Do not take more than 3000mg of Tylenol or 3200mg of Motrin in a 24-hour period. °Do not take ibuprofen/Motrin if you have a history of bleeding stomach ulcers, severe kidney disease, &/or actively taking a blood thinner ° °What if I still have pain? °If you have pain that is not controlled with the over-the-counter pain medications (Tylenol and Motrin or Advil) you might have what we call “breakthrough” pain. You will receive a prescription   for a small amount of an opioid pain medication such as Oxycodone, Tramadol, or Tylenol with Codeine. Use these opioid pills in the first 24 hours after surgery if you have breakthrough pain. Do not take more than 1 pill every 4-6 hours. ° °If you still have uncontrolled pain after using all opioid pills, don't hesitate to call our staff using the number provided. We will help make sure you are managing your pain in the best way possible, and if necessary, we can provide a prescription for additional pain medication. ° ° °Day 1   ° °Time  °Name of Medication Number of pills taken  °Amount of Acetaminophen  °Pain Level  ° °Comments  °AM PM       °AM PM       °AM PM       °AM PM       °AM PM       °AM PM       °AM PM       °AM PM       °Total Daily amount of Acetaminophen °Do not take more than  3,000 mg per day    ° ° °Day 2   ° °Time  °Name of Medication  Number of pills °taken  °Amount of Acetaminophen  °Pain Level  ° °Comments  °AM PM       °AM PM       °AM PM       °AM PM       °AM PM       °AM PM       °AM PM       °AM PM       °Total Daily amount of Acetaminophen °Do not take more than  3,000 mg per day    ° ° °Day 3   ° °Time  °Name of Medication Number of pills taken  °Amount of Acetaminophen  °Pain Level  ° °Comments  °AM PM       °AM PM       °AM PM       °AM PM       ° ° ° °AM PM       °AM PM       °AM PM       °AM PM       °Total Daily amount of Acetaminophen °Do not take more than  3,000 mg per day    ° ° °Day 4   ° °Time  °Name of Medication Number of pills taken  °Amount of Acetaminophen  °Pain Level  ° °Comments  °AM PM       °AM PM       °AM PM       °AM PM       °AM PM       °AM PM       °AM PM       °AM PM       °Total Daily amount of Acetaminophen °Do not take more than  3,000 mg per day    ° ° °Day 5   ° °Time  °Name of Medication Number °of pills taken  °Amount of Acetaminophen  °Pain Level  ° °Comments  °AM PM       °AM PM       °AM PM       °AM PM       °AM PM       °AM   PM       °AM PM       °AM PM       °Total Daily amount of Acetaminophen °Do not take more than  3,000 mg per day    ° ° ° °Day 6   ° °Time  °Name of Medication Number of pills °taken  °Amount of Acetaminophen  °Pain Level  °Comments  °AM PM       °AM PM       °AM PM       °AM PM       °AM PM       °AM PM       °AM PM       °AM PM       °Total Daily amount of Acetaminophen °Do not take more than  3,000 mg per day    ° ° °Day 7   ° °Time  °Name of Medication Number of pills taken  °Amount of Acetaminophen  °Pain Level  ° °Comments  °AM PM       °AM PM       °AM PM       °AM PM       °AM PM       °AM PM       °AM PM       °AM PM       °Total Daily amount of Acetaminophen °Do not take more than  3,000 mg per day    ° ° ° ° °For additional information about how and where to safely dispose of unused opioid °medications - https://www.morepowerfulnc.org ° °Disclaimer: This document  contains information and/or instructional materials adapted from [facility] for the typical patient with your condition. It does not replace medical advice from your health care provider because your experience may differ from that of the °typical patient. Talk to your health care provider if you have any questions about this °document, your condition or your treatment plan. °Adapted from [facility] ° °

## 2022-01-29 NOTE — H&P (Signed)
Admission Note  Tammy Weeks [facility] [birth year]  [id number].    Requesting MD: Dr. Pearline Cables Chief Complaint/Reason for Consult: acute appendicitis   HPI:  67 y.o. female with medical history significant for HTN, GERD, HLD, anxiety who presented to [facility] with abdominal pain that began 1/16.  She states that the pain progressed.  She states that was initially generalized and now currently suprapubic.  She has had associated nausea without vomiting.   Work up in ED significant for CT imaging showing acute appendicitis with appendicolith. WBC 17.7.   Today she continues to have abdominal pain.  This appears to be more suprapubic.  I did review the patient's CT scan laboratory studies personally.  Allergies: ciprofloxacin, oxycodone, sulfa antibiotics Blood thinners: none Past Surgeries: abdominal hysterectomy   ROS: ROS reviewed and negative except as above  History reviewed. No pertinent family history.  Past Medical History:  Diagnosis Date   Anxiety    GERD (gastroesophageal reflux disease)    High cholesterol    Hypertension     Past Surgical History:  Procedure Laterality Date   ABDOMINAL HYSTERECTOMY     ADRENAL GLAND SURGERY     CATARACT EXTRACTION     TRIGGER FINGER RELEASE Left 03/05/2021   Procedure: RELEASE TRIGGER FINGER/A-1 PULLEY LEFT RING FINGER;  Surgeon: Daryll Brod, MD;  Location: [facility];  Service: Orthopedics;  Laterality: Left;  57 MIN    Social History:  reports that she has quit smoking. She has never used smokeless tobacco. She reports current alcohol use. She reports that she does not use drugs.  Allergies:  Allergies  Allergen Reactions   Ciprofloxacin Rash   Codeine Rash   Oxycodone Rash   Quinolones Rash   Sulfa Antibiotics Rash    (Not in a hospital admission)   Blood pressure 118/71, pulse 67, temperature 98.8 F (37.1 C), temperature source Oral, resp. rate 16, SpO2 91 %. Physical  Exam: General: pleasant, WD,  female/female who is laying in bed in NAD HEENT: head is normocephalic, atraumatic.  Sclera are noninjected.  Pupils equal and round. EOMs intact.  Ears and nose without any masses or lesions.  Mouth is pink and moist Heart: regular, rate, and rhythm.  Normal s1,s2. No obvious murmurs, gallops, or rubs noted.  Palpable radial and pedal pulses bilaterally Lungs: CTAB, no wheezes, rhonchi, or rales noted.  Respiratory effort nonlabored Abd: soft, tenderness palpation to the suprapubic area.  ND, +BS, no masses, hernias, or organomegaly MSK: all 4 extremities are symmetrical with no cyanosis, clubbing, or edema. Skin: warm and dry with no masses, lesions, or rashes Neuro: Cranial nerves 2-12 grossly intact, sensation is normal throughout Psych: A&Ox3 with an appropriate affect.    Results for orders placed or performed during the hospital encounter of 01/28/22 (from the past 48 hour(s))  Lipase, blood     Status: None   Collection Time: 01/28/22  5:23 PM  Result Value Ref Range   Lipase 22 11 - 51 U/L    Comment: Performed at [facility], [address] [place], [place] [address]  Comprehensive metabolic panel     Status: Abnormal   Collection Time: 01/28/22  5:23 PM  Result Value Ref Range   Sodium 140 135 - 145 mmol/L   Potassium 3.9 3.5 - 5.1 mmol/L   Chloride 101 98 - 111 mmol/L   CO2 28 22 - 32 mmol/L   Glucose, Bld 104 (H) 70 - 99 mg/dL  Comment: Glucose reference range applies only to samples taken after fasting for at least 8 hours.   BUN 15 8 - 23 mg/dL   Creatinine, Ser 0.83 0.44 - 1.00 mg/dL   Calcium 10.0 8.9 - 10.3 mg/dL   Total Protein 7.8 6.5 - 8.1 g/dL   Albumin 4.9 3.5 - 5.0 g/dL   AST 19 15 - 41 U/L   ALT 18 0 - 44 U/L   Alkaline Phosphatase 91 38 - 126 U/L   Total Bilirubin 0.6 0.3 - 1.2 mg/dL   GFR, Estimated >60 >60 mL/min    Comment: (NOTE) Calculated using the CKD-EPI Creatinine Equation (2021)    Anion  gap 11 5 - 15    Comment: Performed at [facility], [address], [place], [place] [address]  CBC     Status: Abnormal   Collection Time: 01/28/22  5:23 PM  Result Value Ref Range   WBC 17.7 (H) 4.0 - 10.5 K/uL   RBC 4.84 3.87 - 5.11 MIL/uL   Hemoglobin 15.5 (H) 12.0 - 15.0 g/dL   HCT 44.9 36.0 - 46.0 %   MCV 92.8 80.0 - 100.0 fL   MCH 32.0 26.0 - 34.0 pg   MCHC 34.5 30.0 - 36.0 g/dL   RDW 12.0 11.5 - 15.5 %   Platelets 289 150 - 400 K/uL   nRBC 0.0 0.0 - 0.2 %    Comment: Performed at [facility], [address] [place], [place] [address]   CT ABDOMEN PELVIS W CONTRAST  Result Date: 01/28/2022 CLINICAL DATA:  Acute nonlocalized abdominal pain. EXAM: CT ABDOMEN AND PELVIS WITH CONTRAST TECHNIQUE: Multidetector CT imaging of the abdomen and pelvis was performed using the standard protocol following bolus administration of intravenous contrast. RADIATION DOSE REDUCTION: This exam was performed according to the departmental dose-optimization program which includes automated exposure control, adjustment of the mA and/or kV according to patient size and/or use of iterative reconstruction technique. CONTRAST:  62m OMNIPAQUE IOHEXOL 300 MG/ML  SOLN COMPARISON:  CT 04/16/2012.  CT colonography 07/15/2012 FINDINGS: Lower chest: Lung bases are clear.  Small esophageal hiatal hernia. Hepatobiliary: No focal liver abnormality is seen. No gallstones, gallbladder wall thickening, or biliary dilatation. Pancreas: Unremarkable. No pancreatic ductal dilatation or surrounding inflammatory changes. Spleen: Normal in size without focal abnormality. Adrenals/Urinary Tract: Surgical absence of the right adrenal gland. Left adrenal gland appears normal. Kidneys, ureters, and the bladder are unremarkable. Stomach/Bowel: Stomach, small bowel, and colon are not abnormally distended. No wall thickening or inflammatory changes are seen. The appendix is distended and fluid-filled  with appendiceal diameter measuring 1.4 cm. There is an appendicolith at the base of the appendix. Mild stranding around the appendix. No loculated collections. Vascular/Lymphatic: Aortic atherosclerosis. No enlarged abdominal or pelvic lymph nodes. Reproductive: Status post hysterectomy. No adnexal masses. Other: No free air or free fluid in the abdomen. Minimal periumbilical hernias containing fat. Musculoskeletal: Degenerative changes in the spine. No destructive bone lesions. IMPRESSION: 1. Acute appendicitis with distended fluid-filled appendix. Appendicolith is present. No abscess. 2. No evidence of bowel obstruction or inflammation. 3. Aortic atherosclerosis. Electronically Signed   By: WLucienne CapersM.D.   On: 01/28/2022 20:04      Assessment/Plan Acute appendicitis Patient seen and examined and relevant labs and imaging reviewed. Patient imaging and presentation consistent with acute appendicitis. Recommend definitive management with laparoscopic appendectomy. I have discussed the procedure and risks of appendectomy. The risks include but are not limited to bleeding, infection, wound problems,  anesthesia, injury to intra-abdominal organs, possibility of postoperative ileus. She seems to understand and agrees with the plan.    FEN: NPO for OR ID: Patient received Rocephin and Flagyl at the outside hospital. VTE: none currenltly    I reviewed last 24 h vitals and pain scores, last 48 h intake and output, last 24 h labs and trends, and last 24 h imaging results.  This care required moderate level of medical decision making.   Winferd Humphrey, [facility] 01/29/2022, 7:29 AM Please see Amion for pager number during day hours 7:00am-4:30pm

## 2022-01-29 NOTE — Anesthesia Procedure Notes (Signed)
Procedure Name: Intubation Date/Time: 01/29/2022 10:04 AM  Performed by: Nehemiah Mcfarren D, CRNAPre-anesthesia Checklist: Patient identified, Emergency Drugs available, Suction available and Patient being monitored Patient Re-evaluated:Patient Re-evaluated prior to induction Oxygen Delivery Method: Circle system utilized Preoxygenation: Pre-oxygenation with 100% oxygen Induction Type: IV induction Ventilation: Mask ventilation without difficulty Laryngoscope Size: Mac and 3 Grade View: Grade I Tube type: Oral Number of attempts: 1 Airway Equipment and Method: Stylet and Oral airway Placement Confirmation: ETT inserted through vocal cords under direct vision, positive ETCO2 and breath sounds checked- equal and bilateral Secured at: 21 cm Tube secured with: Tape Dental Injury: Teeth and Oropharynx as per pre-operative assessment

## 2022-01-29 NOTE — ED Notes (Signed)
Handoff report given to [place] RN at [facility] and to [facility] .

## 2022-01-29 NOTE — ED Notes (Signed)
[facility] at bedside 

## 2022-01-29 NOTE — ED Notes (Signed)
Called [facility] and spoke to Hersey; patient will be tx to [facility]; accepting MD: Rosendo Gros.

## 2022-01-29 NOTE — Op Note (Signed)
01/29/2022  10:38 AM  PATIENT:  Tammy Weeks  67 y.o. female  PRE-OPERATIVE DIAGNOSIS:  ACUTE APPENDICITIS  POST-OPERATIVE DIAGNOSIS:  ACUTE APPENDICITIS  PROCEDURE:  Procedure(s): APPENDECTOMY LAPAROSCOPIC (N/A)  SURGEON:  Surgeon(s) and Role:    * Ralene Ok, MD - Primary  ANESTHESIA:   local and general  EBL:  minimal   BLOOD ADMINISTERED:none  DRAINS: none   LOCAL MEDICATIONS USED:  BUPIVICAINE   SPECIMEN:  Source of Specimen:  appendix  DISPOSITION OF SPECIMEN:  PATHOLOGY  COUNTS:  YES  TOURNIQUET:  * No tourniquets in log *  DICTATION: .Dragon Dictation Complications: none  Counts: reported as correct x 2  Findings:  The patient had an acutely inflamed appendix, non perforated  Specimen: Appendix  Indications for procedure:  The patient is a 67 year old female with a history of periumbilical pain localized in the right lower quadrant patient had a CT scan which revealed signs consistent with acute appendicitis the patient back in for laparoscopic appendectomy.  Details of the procedure: The patient was taken back to the operating room. The patient was placed in supine position with bilateral SCDs in place. The patient was prepped and draped in the usual sterile fashion.  After appropriate anitbiotics were confirmed, a time-out was confirmed and all facts were verified.    A pneumoperitoneum of 14 mmHg was obtained via a Veress needle technique in the left lower quadrant quadrant.  A 5 mm trocar and 5 mm camera then placed intra-abdominally there is no injury to any intra-abdominal organs a 10 mm infraumbilical port was placed and direct visualization as was a 5 mm port in the suprapubic area.   The appendix was identified and seen to be non-perforated.  The appendix was cleaned down to the appendiceal base. The mesoappendix was then incised and the appendiceal artery was cauterized.  The the appendiceal base was clean.  A gold hemoclip was placed  proximallyx2 and one distally and the appendix was transected between these 2. A retrieval bag was then placed into the abdomen and the specimen placed in the bag. The appendiceal stump was cauterized. We evacuate the fluid from the pelvis until the effluent was clear.  The appendix and retrieval  bag was then retrieved via the supraumbilical port. #1 Vicryl was used to reapproximate the fascia at the umbilical port site x1. The skin was reapproximated all port sites 3-0 Monocryl subcuticular fashion. The skin was dressed with Dermabond.  The patient was awakened from general anesthesia was taken to recovery room in stable condition.      PLAN OF CARE: Discharge to home after PACU  PATIENT DISPOSITION:  PACU - hemodynamically stable.   Delay start of Pharmacological VTE agent (>24hrs) due to surgical blood loss or risk of bleeding: not applicable

## 2022-01-29 NOTE — Anesthesia Preprocedure Evaluation (Signed)
Anesthesia Evaluation  Patient identified by MRN, date of birth, ID band Patient awake    Reviewed: Allergy & Precautions, H&P , NPO status , Patient's Chart, lab work & pertinent test results  Airway Mallampati: II  TM Distance: >3 FB Neck ROM: Full    Dental no notable dental hx.    Pulmonary neg pulmonary ROS, former smoker   Pulmonary exam normal breath sounds clear to auscultation       Cardiovascular hypertension, Normal cardiovascular exam Rhythm:Regular Rate:Normal     Neuro/Psych negative neurological ROS  negative psych ROS   GI/Hepatic Neg liver ROS,GERD  ,,  Endo/Other  negative endocrine ROS    Renal/GU negative Renal ROS  negative genitourinary   Musculoskeletal negative musculoskeletal ROS (+)    Abdominal   Peds negative pediatric ROS (+)  Hematology negative hematology ROS (+)   Anesthesia Other Findings   Reproductive/Obstetrics negative OB ROS                             Anesthesia Physical Anesthesia Plan  ASA: 2  Anesthesia Plan: General   Post-op Pain Management: Minimal or no pain anticipated   Induction: Intravenous  PONV Risk Score and Plan: 3 and Ondansetron, Dexamethasone and Treatment may vary due to age or medical condition  Airway Management Planned: Oral ETT  Additional Equipment:   Intra-op Plan:   Post-operative Plan: Extubation in OR  Informed Consent: I have reviewed the patients History and Physical, chart, labs and discussed the procedure including the risks, benefits and alternatives for the proposed anesthesia with the patient or authorized representative who has indicated his/her understanding and acceptance.     Dental advisory given  Plan Discussed with: CRNA and Surgeon  Anesthesia Plan Comments:        Anesthesia Quick Evaluation

## 2022-01-30 ENCOUNTER — Encounter (HOSPITAL_COMMUNITY): Payer: Self-pay | Admitting: General Surgery

## 2022-01-30 LAB — SURGICAL PATHOLOGY

## 2022-02-12 DIAGNOSIS — H43812 Vitreous degeneration, left eye: Secondary | ICD-10-CM | POA: Diagnosis not present

## 2022-02-25 DIAGNOSIS — H10411 Chronic giant papillary conjunctivitis, right eye: Secondary | ICD-10-CM | POA: Diagnosis not present

## 2022-02-26 DIAGNOSIS — H6123 Impacted cerumen, bilateral: Secondary | ICD-10-CM | POA: Diagnosis not present

## 2022-03-10 DIAGNOSIS — H10411 Chronic giant papillary conjunctivitis, right eye: Secondary | ICD-10-CM | POA: Diagnosis not present

## 2022-03-10 DIAGNOSIS — H04123 Dry eye syndrome of bilateral lacrimal glands: Secondary | ICD-10-CM | POA: Diagnosis not present

## 2022-03-18 DIAGNOSIS — Z86010 Personal history of colonic polyps: Secondary | ICD-10-CM | POA: Diagnosis not present

## 2022-03-18 DIAGNOSIS — Z09 Encounter for follow-up examination after completed treatment for conditions other than malignant neoplasm: Secondary | ICD-10-CM | POA: Diagnosis not present

## 2022-03-18 DIAGNOSIS — K648 Other hemorrhoids: Secondary | ICD-10-CM | POA: Diagnosis not present

## 2022-03-18 DIAGNOSIS — K5730 Diverticulosis of large intestine without perforation or abscess without bleeding: Secondary | ICD-10-CM | POA: Diagnosis not present

## 2022-03-28 DIAGNOSIS — H01001 Unspecified blepharitis right upper eyelid: Secondary | ICD-10-CM | POA: Diagnosis not present

## 2022-04-10 DIAGNOSIS — M8580 Other specified disorders of bone density and structure, unspecified site: Secondary | ICD-10-CM | POA: Diagnosis not present

## 2022-04-10 DIAGNOSIS — E782 Mixed hyperlipidemia: Secondary | ICD-10-CM | POA: Diagnosis not present

## 2022-04-10 DIAGNOSIS — I10 Essential (primary) hypertension: Secondary | ICD-10-CM | POA: Diagnosis not present

## 2022-04-15 DIAGNOSIS — Z1389 Encounter for screening for other disorder: Secondary | ICD-10-CM | POA: Diagnosis not present

## 2022-04-15 DIAGNOSIS — I700 Atherosclerosis of aorta: Secondary | ICD-10-CM | POA: Diagnosis not present

## 2022-04-15 DIAGNOSIS — F419 Anxiety disorder, unspecified: Secondary | ICD-10-CM | POA: Diagnosis not present

## 2022-04-15 DIAGNOSIS — Z0000 Encounter for general adult medical examination without abnormal findings: Secondary | ICD-10-CM | POA: Diagnosis not present

## 2022-04-15 DIAGNOSIS — I10 Essential (primary) hypertension: Secondary | ICD-10-CM | POA: Diagnosis not present

## 2022-04-15 DIAGNOSIS — E782 Mixed hyperlipidemia: Secondary | ICD-10-CM | POA: Diagnosis not present

## 2022-04-15 DIAGNOSIS — E559 Vitamin D deficiency, unspecified: Secondary | ICD-10-CM | POA: Diagnosis not present

## 2022-04-15 DIAGNOSIS — M8580 Other specified disorders of bone density and structure, unspecified site: Secondary | ICD-10-CM | POA: Diagnosis not present

## 2022-04-15 DIAGNOSIS — D3500 Benign neoplasm of unspecified adrenal gland: Secondary | ICD-10-CM | POA: Diagnosis not present

## 2022-04-17 DIAGNOSIS — Z85828 Personal history of other malignant neoplasm of skin: Secondary | ICD-10-CM | POA: Diagnosis not present

## 2022-04-17 DIAGNOSIS — L820 Inflamed seborrheic keratosis: Secondary | ICD-10-CM | POA: Diagnosis not present

## 2022-04-21 DIAGNOSIS — H01005 Unspecified blepharitis left lower eyelid: Secondary | ICD-10-CM | POA: Diagnosis not present

## 2022-04-21 DIAGNOSIS — H43813 Vitreous degeneration, bilateral: Secondary | ICD-10-CM | POA: Diagnosis not present

## 2022-04-21 DIAGNOSIS — H01002 Unspecified blepharitis right lower eyelid: Secondary | ICD-10-CM | POA: Diagnosis not present

## 2022-04-29 DIAGNOSIS — R319 Hematuria, unspecified: Secondary | ICD-10-CM | POA: Diagnosis not present

## 2022-04-29 DIAGNOSIS — N898 Other specified noninflammatory disorders of vagina: Secondary | ICD-10-CM | POA: Diagnosis not present

## 2022-05-06 DIAGNOSIS — Z1272 Encounter for screening for malignant neoplasm of vagina: Secondary | ICD-10-CM | POA: Diagnosis not present

## 2022-05-06 DIAGNOSIS — N390 Urinary tract infection, site not specified: Secondary | ICD-10-CM | POA: Diagnosis not present

## 2022-05-06 DIAGNOSIS — N760 Acute vaginitis: Secondary | ICD-10-CM | POA: Diagnosis not present

## 2022-05-06 DIAGNOSIS — Z779 Other contact with and (suspected) exposures hazardous to health: Secondary | ICD-10-CM | POA: Diagnosis not present

## 2022-05-06 DIAGNOSIS — Z6829 Body mass index (BMI) 29.0-29.9, adult: Secondary | ICD-10-CM | POA: Diagnosis not present

## 2022-06-25 DIAGNOSIS — D2272 Melanocytic nevi of left lower limb, including hip: Secondary | ICD-10-CM | POA: Diagnosis not present

## 2022-06-25 DIAGNOSIS — D1801 Hemangioma of skin and subcutaneous tissue: Secondary | ICD-10-CM | POA: Diagnosis not present

## 2022-06-25 DIAGNOSIS — Z85828 Personal history of other malignant neoplasm of skin: Secondary | ICD-10-CM | POA: Diagnosis not present

## 2022-06-25 DIAGNOSIS — L814 Other melanin hyperpigmentation: Secondary | ICD-10-CM | POA: Diagnosis not present

## 2022-06-25 DIAGNOSIS — L570 Actinic keratosis: Secondary | ICD-10-CM | POA: Diagnosis not present

## 2022-06-25 DIAGNOSIS — D224 Melanocytic nevi of scalp and neck: Secondary | ICD-10-CM | POA: Diagnosis not present

## 2022-06-25 DIAGNOSIS — D225 Melanocytic nevi of trunk: Secondary | ICD-10-CM | POA: Diagnosis not present

## 2022-06-25 DIAGNOSIS — L821 Other seborrheic keratosis: Secondary | ICD-10-CM | POA: Diagnosis not present

## 2022-07-18 ENCOUNTER — Ambulatory Visit (INDEPENDENT_AMBULATORY_CARE_PROVIDER_SITE_OTHER): Payer: Medicare Other | Admitting: Sports Medicine

## 2022-07-18 ENCOUNTER — Ambulatory Visit: Payer: Medicare Other

## 2022-07-18 DIAGNOSIS — M4722 Other spondylosis with radiculopathy, cervical region: Secondary | ICD-10-CM | POA: Diagnosis not present

## 2022-07-18 DIAGNOSIS — Z1231 Encounter for screening mammogram for malignant neoplasm of breast: Secondary | ICD-10-CM | POA: Diagnosis not present

## 2022-07-18 DIAGNOSIS — M5030 Other cervical disc degeneration, unspecified cervical region: Secondary | ICD-10-CM

## 2022-07-18 DIAGNOSIS — M5412 Radiculopathy, cervical region: Secondary | ICD-10-CM | POA: Diagnosis not present

## 2022-07-18 MED ORDER — PREDNISONE 50 MG PO TABS: ORAL_TABLET | ORAL | 0 refills | Status: DC

## 2022-07-18 NOTE — Progress Notes (Signed)
    Procedures performed today:    None.  Independent interpretation of notes and tests performed by another provider:   None.  Brief History, Exam, Impression, and Recommendations:    DDD (degenerative disc disease), cervical This is a pleasant 67 year old female, she has a long history of pain in her right neck, radiating periscapular, around to the axilla, numbness and tingling in first and second fingers on the right hand. She had x-rays done about a year ago that did show C5-C6 DDD. I explained the anatomy and pathophysiology, will do 5-day burst of prednisone, x-rays today, formal PT at [facility], return to see me in 6 weeks, MR for interventional planning if not better.    ____________________________________________ Ihor Austin. Benjamin Stain, M.D., ABFM., CAQSM., AME. Primary Care and Sports Medicine [facility]  Adjunct Professor of Family Medicine  [place] [employer] [facility] [employer]  Restaurant manager, fast food

## 2022-07-18 NOTE — Assessment & Plan Note (Signed)
This is a pleasant 67 year old female, she has a long history of pain in her right neck, radiating periscapular, around to the axilla, numbness and tingling in first and second fingers on the right hand. She had x-rays done about a year ago that did show C5-C6 DDD. I explained the anatomy and pathophysiology, will do 5-day burst of prednisone, x-rays today, formal PT at [facility] return to see me in 6 weeks, MR for interventional planning if not better.

## 2022-07-21 ENCOUNTER — Ambulatory Visit: Payer: Medicare Other | Admitting: Sports Medicine

## 2022-07-22 ENCOUNTER — Ambulatory Visit: Payer: Medicare Other | Attending: Sports Medicine

## 2022-07-22 ENCOUNTER — Encounter: Payer: Self-pay | Admitting: Sports Medicine

## 2022-07-22 ENCOUNTER — Other Ambulatory Visit: Payer: Self-pay

## 2022-07-22 DIAGNOSIS — M542 Cervicalgia: Secondary | ICD-10-CM | POA: Insufficient documentation

## 2022-07-22 DIAGNOSIS — M5030 Other cervical disc degeneration, unspecified cervical region: Secondary | ICD-10-CM | POA: Insufficient documentation

## 2022-07-22 DIAGNOSIS — M6281 Muscle weakness (generalized): Secondary | ICD-10-CM | POA: Diagnosis present

## 2022-07-22 DIAGNOSIS — R252 Cramp and spasm: Secondary | ICD-10-CM | POA: Insufficient documentation

## 2022-07-22 NOTE — Therapy (Signed)
OUTPATIENT PHYSICAL THERAPY CERVICAL EVALUATION   Patient Name: Tammy Weeks MRN: [id number] DOB:1955-05-03, 67 y.o., female Today's Date: 07/22/2022  END OF SESSION:  PT End of Session - 07/22/22 1114     Visit Number 1    Date for PT Re-Evaluation 09/16/22    Authorization Type MEDICARE PART A AND B    PT Start Time 1100    PT Stop Time 1145    PT Time Calculation (min) 45 min    Activity Tolerance Patient tolerated treatment well    Behavior During Therapy WFL for tasks assessed/performed             Past Medical History:  Diagnosis Date   Anxiety    GERD (gastroesophageal reflux disease)    High cholesterol    Hypertension    Past Surgical History:  Procedure Laterality Date   ABDOMINAL HYSTERECTOMY     ADRENAL GLAND SURGERY     CATARACT EXTRACTION     LAPAROSCOPIC APPENDECTOMY N/A 01/29/2022   Procedure: APPENDECTOMY LAPAROSCOPIC;  Surgeon: Axel Filler, MD;  Location: [facility];  Service: General;  Laterality: N/A;   TRIGGER FINGER RELEASE Left 03/05/2021   Procedure: RELEASE TRIGGER FINGER/A-1 PULLEY LEFT RING FINGER;  Surgeon: Cindee Salt, MD;  Location: [facility];  Service: Orthopedics;  Laterality: Left;  45 MIN   Patient Active Problem List   Diagnosis Date Noted   DDD (degenerative disc disease), cervical 07/18/2022   Lumbosacral spondylosis 05/21/2020   Coughing 01/09/2020   Herpes simplex 07/21/2019   Orbital wrinkles 08/19/2018   Primary osteoarthritis of both knees 07/13/2015   Low grade squamous intraepithelial lesion (LGSIL) on cervicovaginal cytologic smear 03/19/2011    PCP: Georgann Housekeeper, MD  REFERRING PROVIDER: Monica Becton, MD  REFERRING DIAG: M50.30 (ICD-10-CM) - DDD (degenerative disc disease), cervical  THERAPY DIAG:  Cervicalgia - Plan: PT plan of care cert/re-cert  Muscle weakness (generalized) - Plan: PT plan of care cert/re-cert  Cramp and spasm - Plan: PT plan of care cert/re-cert  Rationale  for Evaluation and Treatment: Rehabilitation  ONSET DATE: 07/18/2022  SUBJECTIVE:                                                                                                                                                                                                         SUBJECTIVE STATEMENT: Retired from Airline pilot job.  Now retired.  Volunteers a lot at [employer].  She admits she is picking up heavy boxes of canned food at times.  Walking at least a mile  per day.  Walks longer when she has time.  Very active social life.  Member of [employer] but is still a little hesitant since Covid.   Hand dominance: Right  PERTINENT HISTORY:  Evaluate and Treat. 1-2 times per week for 4-6 weeks. Decrease pain, increase strength, flexibility, function, and range of motion. Modalities may include, traction, ionto, phono, and stim. May include dry needling with or without stim.  PAIN:  Are you having pain?  .5-1/10  PRECAUTIONS: None  WEIGHT BEARING RESTRICTIONS: No  FALLS:  Has patient fallen in last 6 months? No  LIVING ENVIRONMENT: Lives with: lives with their spouse  OCCUPATION: Hx of sales and traveling  PLOF: Independent, Independent with basic ADLs, Independent with household mobility without device, Independent with community mobility without device, Independent with homemaking with ambulation, Independent with gait, Independent with transfers, Vocation/Vocational requirements: retired , and Leisure: walks, volunteers.   PATIENT GOALS: to eliminate neck and arm pain  NEXT MD VISIT: 09/05/22  OBJECTIVE:   DIAGNOSTIC FINDINGS:  Cervical   PATIENT SURVEYS:  FOTO 66, goal is 60  COGNITION: Overall cognitive status: Within functional limits for tasks assessed  SENSATION: WFL  POSTURE:  decreased cervical lordosis  PALPATION: Right upper trap, levator scap and cervical paraspinals with trigger points and tight bands   CERVICAL ROM:   Active ROM  A/PROM (deg) eval  Flexion wnl  Extension wnl  Right lateral flexion 50%  Left lateral flexion 50%  Right rotation wnl  Left rotation wnl   (Blank rows = not tested)  UPPER EXTREMITY ROM:   UPPER EXTREMITY MMT: Generally   CERVICAL SPECIAL TESTS:  Imaging confirms DDD and    TODAY'S TREATMENT:                                                                                                                              /DATE: 07/22/22 Evaluation completed and initiated HEP   PATIENT EDUCATION:  Education details: Initiated HEP Person educated: Patient Education method: Programmer, multimedia, Facilities manager, Verbal cues, and Handouts Education comprehension: verbalized understanding, returned demonstration, and verbal cues required  HOME EXERCISE PROGRAM: Access Code: [email or web address] URL: [email or web address] Date: 07/22/2022 Prepared by: Mikey Kirschner  Exercises - Prone Shoulder Extension - Single Arm  - 2 x daily - 7 x weekly - 2 sets - 10 reps - Prone Shoulder Row  - 2 x daily - 7 x weekly - 2 sets - 10 reps - Prone Single Arm Shoulder Horizontal Abduction with Scapular Retraction and Palm Down  - 2 x daily - 7 x weekly - 2 sets - 10 reps - Sidelying Shoulder External Rotation  - 2 x daily - 7 x weekly - 2 sets - 10 reps - Single Arm Serratus Punches in Supine with Dumbbell  - 2 x daily - 7 x weekly - 2 sets - 10 reps - Standing Shoulder Flexion to 90 Degrees with Dumbbells  - 2 x daily - 7 x weekly -  2 sets - 10 reps - Standing Shoulder Scaption  - 2 x daily - 7 x weekly - 2 sets - 10 reps  ASSESSMENT:  CLINICAL IMPRESSION: Patient is a 67 y.o. female who was seen today for physical therapy evaluation and treatment for cervical DDD.   She presents with decreased ROM, strength and function along with elevated pain.  She should respond well to skilled PT for postural and right shoulder strengthening and stabilization along with dry needling and PROM of the C spine.     OBJECTIVE IMPAIRMENTS: decreased ROM, decreased strength, increased fascial restrictions, increased muscle spasms, impaired flexibility, postural dysfunction, and pain.   ACTIVITY LIMITATIONS: carrying, lifting, sleeping, transfers, bed mobility, dressing, reach over head, and hygiene/grooming  PARTICIPATION LIMITATIONS: meal prep, cleaning, laundry, driving, shopping, community activity, occupation, and church  PERSONAL FACTORS: Age and Time since onset of injury/illness/exacerbation are also affecting patient's functional outcome.   REHAB POTENTIAL: Good  CLINICAL DECISION MAKING: Stable/uncomplicated  EVALUATION COMPLEXITY: Low   GOALS: Goals reviewed with patient? Yes  SHORT TERM GOALS: Target date: 08/19/2022   Pain report to be no greater than 4/10  Baseline:  Goal status: INITIAL  2.  Patient will be independent with initial HEP  Baseline:  Goal status: INITIAL   LONG TERM GOALS: Target date: 09/16/2022   Patient to report pain no greater than 2/10  Baseline:  Goal status: INITIAL  2.  Patient to be independent with advanced HEP  Baseline:  Goal status: INITIAL  3.  Patient to report 85% improvement in overall symptoms Baseline:  Goal status: INITIAL  4.  Patient to be able to sleep through the night  Baseline:  Goal status: INITIAL  5.  FOTO to be 70 Baseline:  Goal status: INITIAL  6.  Patient will be able to reach overhead into cabinets and on top of shelves without right shoulder or neck pain  Baseline:  Goal status: INITIAL   PLAN:  PT FREQUENCY: 2x/week  PT DURATION: 8 weeks  PLANNED INTERVENTIONS: Therapeutic exercises, Therapeutic activity, Neuromuscular re-education, Balance training, Patient/Family education, Self Care, Joint mobilization, Vestibular training, DME instructions, Aquatic Therapy, Dry Needling, Electrical stimulation, Spinal mobilization, Cryotherapy, Moist heat, scar mobilization, Splintting, Taping, Vasopneumatic device,  Traction, Ionotophoresis 4mg /ml Dexamethasone, Manual therapy, and Re-evaluation  PLAN FOR NEXT SESSION: Review HEP, postural strengthening   Vernell Barrier, PT 07/22/2022, 4:17 PM

## 2022-07-23 DIAGNOSIS — L309 Dermatitis, unspecified: Secondary | ICD-10-CM | POA: Diagnosis not present

## 2022-07-23 DIAGNOSIS — Z85828 Personal history of other malignant neoplasm of skin: Secondary | ICD-10-CM | POA: Diagnosis not present

## 2022-07-24 ENCOUNTER — Ambulatory Visit: Payer: Medicare Other | Admitting: Rehabilitative and Restorative Service Providers"

## 2022-07-24 ENCOUNTER — Encounter: Payer: Self-pay | Admitting: Rehabilitative and Restorative Service Providers"

## 2022-07-24 DIAGNOSIS — M6281 Muscle weakness (generalized): Secondary | ICD-10-CM

## 2022-07-24 DIAGNOSIS — M5030 Other cervical disc degeneration, unspecified cervical region: Secondary | ICD-10-CM | POA: Diagnosis not present

## 2022-07-24 DIAGNOSIS — R252 Cramp and spasm: Secondary | ICD-10-CM | POA: Diagnosis not present

## 2022-07-24 DIAGNOSIS — M542 Cervicalgia: Secondary | ICD-10-CM

## 2022-07-24 NOTE — Therapy (Signed)
OUTPATIENT PHYSICAL THERAPY CERVICAL EVALUATION   Patient Name: Tammy Weeks MRN: [id number] DOB:1955-08-13, 67 y.o., female Today's Date: 07/24/2022  END OF SESSION:  PT End of Session - 07/24/22 1110     Visit Number 2    Date for PT Re-Evaluation 09/16/22    Authorization Type MEDICARE PART A AND B    PT Start Time 1100    PT Stop Time 1140    PT Time Calculation (min) 40 min    Activity Tolerance Patient tolerated treatment well    Behavior During Therapy WFL for tasks assessed/performed             Past Medical History:  Diagnosis Date   Anxiety    GERD (gastroesophageal reflux disease)    High cholesterol    Hypertension    Past Surgical History:  Procedure Laterality Date   ABDOMINAL HYSTERECTOMY     ADRENAL GLAND SURGERY     CATARACT EXTRACTION     LAPAROSCOPIC APPENDECTOMY N/A 01/29/2022   Procedure: APPENDECTOMY LAPAROSCOPIC;  Surgeon: Axel Filler, MD;  Location: [facility];  Service: General;  Laterality: N/A;   TRIGGER FINGER RELEASE Left 03/05/2021   Procedure: RELEASE TRIGGER FINGER/A-1 PULLEY LEFT RING FINGER;  Surgeon: Cindee Salt, MD;  Location: [facility];  Service: Orthopedics;  Laterality: Left;  45 MIN   Patient Active Problem List   Diagnosis Date Noted   DDD (degenerative disc disease), cervical 07/18/2022   Lumbosacral spondylosis 05/21/2020   Coughing 01/09/2020   Herpes simplex 07/21/2019   Orbital wrinkles 08/19/2018   Primary osteoarthritis of both knees 07/13/2015   Low grade squamous intraepithelial lesion (LGSIL) on cervicovaginal cytologic smear 03/19/2011    PCP: Georgann Housekeeper, MD  REFERRING PROVIDER: Monica Becton, MD  REFERRING DIAG: M50.30 (ICD-10-CM) - DDD (degenerative disc disease), cervical  THERAPY DIAG:  Cervicalgia  Muscle weakness (generalized)  Cramp and spasm  Rationale for Evaluation and Treatment: Rehabilitation  ONSET DATE: 07/18/2022  SUBJECTIVE:                                                                                                                                                                                                          SUBJECTIVE STATEMENT: Pt reports that she had a massage yesterday and that helped.  States very minimal pain today. Hand dominance: Right  PERTINENT HISTORY:  Evaluate and Treat. 1-2 times per week for 4-6 weeks. Decrease pain, increase strength, flexibility, function, and range of motion. Modalities may include, traction, ionto, phono, and stim. May include dry needling with or  without stim.  PAIN:  PAIN:  Are you having pain? Yes NPRS scale: 0-1/10 Pain location: cervical and lats PAIN TYPE: aching Pain description: intermittent  Aggravating factors: stress, increased activity Relieving factors: massage   PRECAUTIONS: None  WEIGHT BEARING RESTRICTIONS: No  FALLS:  Has patient fallen in last 6 months? No  LIVING ENVIRONMENT: Lives with: lives with their spouse  OCCUPATION: Hx of sales and traveling  PLOF: Independent, Independent with basic ADLs, Independent with household mobility without device, Independent with community mobility without device, Independent with homemaking with ambulation, Independent with gait, Independent with transfers, Vocation/Vocational requirements: retired , and Leisure: walks, volunteers.   PATIENT GOALS: to eliminate neck and arm pain  NEXT MD VISIT: 09/05/22  OBJECTIVE:   DIAGNOSTIC FINDINGS:  Cervical Radiograph on 07/18/2022: IMPRESSION: 1. Stable lower cervical spondylosis.  No acute bony abnormality.  PATIENT SURVEYS:  Eval:  FOTO 66, goal is 73  COGNITION: Overall cognitive status: Within functional limits for tasks assessed  SENSATION: WFL  POSTURE:  decreased cervical lordosis  PALPATION: Right upper trap, levator scap and cervical paraspinals with trigger points and tight bands   CERVICAL ROM:   Active ROM A/PROM (deg) eval  Flexion wnl   Extension wnl  Right lateral flexion 50%  Left lateral flexion 50%  Right rotation wnl  Left rotation wnl   (Blank rows = not tested)  UPPER EXTREMITY ROM:  WFL  UPPER EXTREMITY MMT: Generally 4 to 4+/5 grossly throughout UE     TODAY'S TREATMENT:                                                                                                                               DATE: 07/24/2022 UBE level 1.0 x3 min each direction with PT present to discuss status Standing facing wall performing 3 way scapular retraction with blue loop 2x5 bilat Supine serratus punch with 1# x10.  With 2# 2x10 bilat Prone:  shoulder flexion, horizontal abduction, extension.  X10 bilat Quadruped cat/cow x10 Bear plank 3x15 sec Standing facing wall lower trap lift off 2x10 Standing rows with green tband 2x10 2x 20 pec stretch in doorway Standing L stretch on barre 2x 20 sec    DATE: 07/22/22 Evaluation completed and initiated HEP   PATIENT EDUCATION:  Education details: Initiated HEP Person educated: Patient Education method: Programmer, multimedia, Facilities manager, Verbal cues, and Handouts Education comprehension: verbalized understanding, returned demonstration, and verbal cues required  HOME EXERCISE PROGRAM: Access Code: [email or web address] URL: [email or web address] Date: 07/22/2022 Prepared by: Mikey Kirschner  Exercises - Prone Shoulder Extension - Single Arm  - 2 x daily - 7 x weekly - 2 sets - 10 reps - Prone Shoulder Row  - 2 x daily - 7 x weekly - 2 sets - 10 reps - Prone Single Arm Shoulder Horizontal Abduction with Scapular Retraction and Palm Down  - 2 x daily - 7 x weekly - 2 sets - 10 reps - Sidelying Shoulder External Rotation  -  2 x daily - 7 x weekly - 2 sets - 10 reps - Single Arm Serratus Punches in Supine with Dumbbell  - 2 x daily - 7 x weekly - 2 sets - 10 reps - Standing Shoulder Flexion to 90 Degrees with Dumbbells  - 2 x daily - 7 x weekly - 2 sets - 10 reps - Standing  Shoulder Scaption  - 2 x daily - 7 x weekly - 2 sets - 10 reps  ASSESSMENT:  CLINICAL IMPRESSION: Patient presented to clinic with 0-1/10 pain. Patient responded well to all new interventions that were introduced with no reproduction of neck pain. However, the patient required moderate verbal and tactile cuing during several exercises to decrease the reliance on upper trapezius and reducing the pace of exercises indicating poor neuromuscular control of the shoulder upward rotators likely contributing to postural dysfunction and restricted tissue extensibility along the cervical spine.   OBJECTIVE IMPAIRMENTS: decreased ROM, decreased strength, increased fascial restrictions, increased muscle spasms, impaired flexibility, postural dysfunction, and pain.   ACTIVITY LIMITATIONS: carrying, lifting, sleeping, transfers, bed mobility, dressing, reach over head, and hygiene/grooming  PARTICIPATION LIMITATIONS: meal prep, cleaning, laundry, driving, shopping, community activity, occupation, and church  PERSONAL FACTORS: Age and Time since onset of injury/illness/exacerbation are also affecting patient's functional outcome.   REHAB POTENTIAL: Good  CLINICAL DECISION MAKING: Stable/uncomplicated  EVALUATION COMPLEXITY: Low   GOALS: Goals reviewed with patient? Yes  SHORT TERM GOALS: Target date: 08/19/2022   Pain report to be no greater than 4/10  Baseline:  Goal status: ONGOING  2.  Patient will be independent with initial HEP  Baseline:  Goal status: ONGOING   LONG TERM GOALS: Target date: 09/16/2022   Patient to report pain no greater than 2/10  Baseline:  Goal status: INITIAL  2.  Patient to be independent with advanced HEP  Baseline:  Goal status: INITIAL  3.  Patient to report 85% improvement in overall symptoms Baseline:  Goal status: INITIAL  4.  Patient to be able to sleep through the night  Baseline:  Goal status: INITIAL  5.  FOTO to be 70 Baseline:  Goal  status: INITIAL  6.  Patient will be able to reach overhead into cabinets and on top of shelves without right shoulder or neck pain  Baseline:  Goal status: INITIAL   PLAN:  PT FREQUENCY: 2x/week  PT DURATION: 8 weeks  PLANNED INTERVENTIONS: Therapeutic exercises, Therapeutic activity, Neuromuscular re-education, Balance training, Patient/Family education, Self Care, Joint mobilization, Vestibular training, DME instructions, Aquatic Therapy, Dry Needling, Electrical stimulation, Spinal mobilization, Cryotherapy, Moist heat, scar mobilization, Splintting, Taping, Vasopneumatic device, Traction, Ionotophoresis 4mg /ml Dexamethasone, Manual therapy, and Re-evaluation  PLAN FOR NEXT SESSION: Review HEP, postural strengthening   Ailene Ards, SPT [place], PT 07/24/2022, 11:53 AM  [facility] [address] [place], [place] [address] Phone # [phone] Fax [phone]

## 2022-08-04 ENCOUNTER — Encounter: Payer: Self-pay | Admitting: Rehabilitative and Restorative Service Providers"

## 2022-08-04 ENCOUNTER — Ambulatory Visit: Payer: Medicare Other | Admitting: Rehabilitative and Restorative Service Providers"

## 2022-08-04 DIAGNOSIS — M5030 Other cervical disc degeneration, unspecified cervical region: Secondary | ICD-10-CM | POA: Diagnosis not present

## 2022-08-04 DIAGNOSIS — M6281 Muscle weakness (generalized): Secondary | ICD-10-CM | POA: Diagnosis not present

## 2022-08-04 DIAGNOSIS — M542 Cervicalgia: Secondary | ICD-10-CM

## 2022-08-04 DIAGNOSIS — R252 Cramp and spasm: Secondary | ICD-10-CM | POA: Diagnosis not present

## 2022-08-04 NOTE — Therapy (Signed)
OUTPATIENT PHYSICAL THERAPY TREATMENT NOTE   Patient Name: Tammy Weeks MRN: [id number] DOB:[birth year], 67 y.o., female Today's Date: 08/04/2022  END OF SESSION:  PT End of Session - 08/04/22 0842     Visit Number 3    Date for PT Re-Evaluation 09/16/22    Authorization Type MEDICARE PART A AND B    PT Start Time 0840    PT Stop Time 0920    PT Time Calculation (min) 40 min    Activity Tolerance Patient tolerated treatment well    Behavior During Therapy WFL for tasks assessed/performed             Past Medical History:  Diagnosis Date   Anxiety    GERD (gastroesophageal reflux disease)    High cholesterol    Hypertension    Past Surgical History:  Procedure Laterality Date   ABDOMINAL HYSTERECTOMY     ADRENAL GLAND SURGERY     CATARACT EXTRACTION     LAPAROSCOPIC APPENDECTOMY N/A 01/29/2022   Procedure: APPENDECTOMY LAPAROSCOPIC;  Surgeon: Axel Filler, MD;  Location: [facility];  Service: General;  Laterality: N/A;   TRIGGER FINGER RELEASE Left 03/05/2021   Procedure: RELEASE TRIGGER FINGER/A-1 PULLEY LEFT RING FINGER;  Surgeon: Cindee Salt, MD;  Location: [facility];  Service: Orthopedics;  Laterality: Left;  45 MIN   Patient Active Problem List   Diagnosis Date Noted   DDD (degenerative disc disease), cervical 07/18/2022   Lumbosacral spondylosis 05/21/2020   Coughing 01/09/2020   Herpes simplex 07/21/2019   Orbital wrinkles 08/19/2018   Primary osteoarthritis of both knees 07/13/2015   Low grade squamous intraepithelial lesion (LGSIL) on cervicovaginal cytologic smear 03/19/2011    PCP: Georgann Housekeeper, MD  REFERRING PROVIDER: Monica Becton, MD  REFERRING DIAG: M50.30 (ICD-10-CM) - DDD (degenerative disc disease), cervical  THERAPY DIAG:  Cervicalgia  Muscle weakness (generalized)  Cramp and spasm  Rationale for Evaluation and Treatment: Rehabilitation  ONSET DATE: 07/18/2022  SUBJECTIVE:                                                                                                                                                                                                          SUBJECTIVE STATEMENT: Pt reports that she had a long drive to/from the beach, states that she did the exercises that she could during her trip.  Hand dominance: Right  PERTINENT HISTORY:  Evaluate and Treat. 1-2 times per week for 4-6 weeks. Decrease pain, increase strength, flexibility, function, and range of motion. Modalities may include, traction, ionto, phono, and  stim. May include dry needling with or without stim.  PAIN:  PAIN:  Are you having pain? Yes NPRS scale: 1-2/10 Pain location: cervical and lats PAIN TYPE: aching Pain description: intermittent  Aggravating factors: stress, increased activity Relieving factors: massage   PRECAUTIONS: None  WEIGHT BEARING RESTRICTIONS: No  FALLS:  Has patient fallen in last 6 months? No  LIVING ENVIRONMENT: Lives with: lives with their spouse  OCCUPATION: Hx of sales and traveling  PLOF: Independent, Independent with basic ADLs, Independent with household mobility without device, Independent with community mobility without device, Independent with homemaking with ambulation, Independent with gait, Independent with transfers, Vocation/Vocational requirements: retired , and Leisure: walks, volunteers.   PATIENT GOALS: to eliminate neck and arm pain  NEXT MD VISIT: 09/05/22  OBJECTIVE:   DIAGNOSTIC FINDINGS:  Cervical Radiograph on 07/18/2022: IMPRESSION: 1. Stable lower cervical spondylosis.  No acute bony abnormality.  PATIENT SURVEYS:  Eval:  FOTO 66, goal is 46  COGNITION: Overall cognitive status: Within functional limits for tasks assessed  SENSATION: WFL  POSTURE:  decreased cervical lordosis  PALPATION: Right upper trap, levator scap and cervical paraspinals with trigger points and tight bands   CERVICAL ROM:   Active ROM A/PROM  (deg) eval  Flexion wnl  Extension wnl  Right lateral flexion 50%  Left lateral flexion 50%  Right rotation wnl  Left rotation wnl   (Blank rows = not tested)  UPPER EXTREMITY ROM:  WFL  UPPER EXTREMITY MMT: Generally 4 to 4+/5 grossly throughout UE     TODAY'S TREATMENT:                                                                                                                               DATE: 08/04/2022 UBE level 1.0 x3 min each direction with PT present to discuss status Standing facing wall performing 3 way scapular retraction with blue loop x5 bilat, with red tband x5 bilat Standing 4D scapular stabilization with light blue 2# ball x20 each Standing facing wall lower trap lift off 2x10 Standing rows with green tband 2x10 Supine serratus punch with 2# 2x10 bilat Supine shoulder flexion with 2# 2x10 bilat (with cuing for slow velocity/pacing) Trigger Point Dry-Needling  Treatment instructions: Expect mild to moderate muscle soreness. S/S of pneumothorax if dry needled over a lung field, and to seek immediate medical attention should they occur. Patient verbalized understanding of these instructions and education. Patient Consent Given: Yes Education handout provided: Yes Muscles treated: bilateral upper traps Electrical stimulation performed: No Parameters: N/A Treatment response/outcome: Utilized skilled palpation to identify bony landmarks and trigger points.  Able to illicit twitch response and muscle elongation.  Soft tissue mobilization following to further promote tissue elongation.      DATE: 07/24/2022 UBE level 1.0 x3 min each direction with PT present to discuss status Standing facing wall performing 3 way scapular retraction with blue loop 2x5 bilat Supine serratus punch with 1# x10.  With 2#  2x10 bilat Prone:  shoulder flexion, horizontal abduction, extension.  X10 bilat Quadruped cat/cow x10 Bear plank 3x15 sec Standing facing wall lower trap lift  off 2x10 Standing rows with green tband 2x10 2x 20 pec stretch in doorway Standing L stretch on barre 2x 20 sec    DATE: 07/22/22 Evaluation completed and initiated HEP   PATIENT EDUCATION:  Education details: Initiated HEP Person educated: Patient Education method: Programmer, multimedia, Facilities manager, Verbal cues, and Handouts Education comprehension: verbalized understanding, returned demonstration, and verbal cues required  HOME EXERCISE PROGRAM: Access Code: [email or web address] URL: [email or web address] Date: 08/04/2022 Prepared by: Clydie Braun Jacai Kipp  Exercises - Prone Shoulder Extension - Single Arm  - 2 x daily - 7 x weekly - 2 sets - 10 reps - Prone Shoulder Row  - 2 x daily - 7 x weekly - 2 sets - 10 reps - Prone Single Arm Shoulder Horizontal Abduction with Scapular Retraction and Palm Down  - 2 x daily - 7 x weekly - 2 sets - 10 reps - Sidelying Shoulder External Rotation  - 2 x daily - 7 x weekly - 2 sets - 10 reps - Single Arm Serratus Punches in Supine with Dumbbell  - 2 x daily - 7 x weekly - 2 sets - 10 reps - Cat Cow  - 1 x daily - 7 x weekly - 2 sets - 10 reps - Standing Shoulder Flexion to 90 Degrees with Dumbbells  - 2 x daily - 7 x weekly - 2 sets - 10 reps - Standing Shoulder Scaption  - 2 x daily - 7 x weekly - 2 sets - 10 reps - Wall Clock with Theraband  - 1 x daily - 7 x weekly - 2 sets - 5 reps  ASSESSMENT:  CLINICAL IMPRESSION: Ms Fellner presents to skilled PT reporting that she was compliant with her HEP over the break when she was at the beach.  Patient is progressing with HEP.  Patient agreeable to trying dry needling today, but only wants to try upper trap.  Patient with good twitch response noted on each side.  Patient provided with updated HEP for home use to progress towards goals.  Patient continues to require skilled PT to progress towards goal related activities.  OBJECTIVE IMPAIRMENTS: decreased ROM, decreased strength, increased fascial restrictions,  increased muscle spasms, impaired flexibility, postural dysfunction, and pain.   ACTIVITY LIMITATIONS: carrying, lifting, sleeping, transfers, bed mobility, dressing, reach over head, and hygiene/grooming  PARTICIPATION LIMITATIONS: meal prep, cleaning, laundry, driving, shopping, community activity, occupation, and church  PERSONAL FACTORS: Age and Time since onset of injury/illness/exacerbation are also affecting patient's functional outcome.   REHAB POTENTIAL: Good  CLINICAL DECISION MAKING: Stable/uncomplicated  EVALUATION COMPLEXITY: Low   GOALS: Goals reviewed with patient? Yes  SHORT TERM GOALS: Target date: 08/19/2022   Pain report to be no greater than 4/10  Baseline:  Goal status: MET on 7/22  2.  Patient will be independent with initial HEP  Baseline:  Goal status: MET on 7/22   LONG TERM GOALS: Target date: 09/16/2022   Patient to report pain no greater than 2/10  Baseline:  Goal status: INITIAL  2.  Patient to be independent with advanced HEP  Baseline:  Goal status: INITIAL  3.  Patient to report 85% improvement in overall symptoms Baseline:  Goal status: INITIAL  4.  Patient to be able to sleep through the night  Baseline:  Goal status: INITIAL  5.  FOTO to be 70 Baseline:  Goal status:  INITIAL  6.  Patient will be able to reach overhead into cabinets and on top of shelves without right shoulder or neck pain  Baseline:  Goal status: INITIAL   PLAN:  PT FREQUENCY: 2x/week  PT DURATION: 8 weeks  PLANNED INTERVENTIONS: Therapeutic exercises, Therapeutic activity, Neuromuscular re-education, Balance training, Patient/Family education, Self Care, Joint mobilization, Vestibular training, DME instructions, Aquatic Therapy, Dry Needling, Electrical stimulation, Spinal mobilization, Cryotherapy, Moist heat, scar mobilization, Splintting, Taping, Vasopneumatic device, Traction, Ionotophoresis 4mg /ml Dexamethasone, Manual therapy, and  Re-evaluation  PLAN FOR NEXT SESSION: Review HEP, postural strengthening, strengthening    Reather Laurence, PT, DPT 08/04/22, 9:37 AM   [facility] [address] [place], [place] [address] Phone # [phone] Fax [phone]

## 2022-08-04 NOTE — Patient Instructions (Signed)

## 2022-08-06 ENCOUNTER — Ambulatory Visit: Payer: Medicare Other

## 2022-08-08 ENCOUNTER — Ambulatory Visit: Payer: Medicare Other | Admitting: Rehabilitative and Restorative Service Providers"

## 2022-08-12 ENCOUNTER — Ambulatory Visit: Payer: Medicare Other | Admitting: Rehabilitative and Restorative Service Providers"

## 2022-08-12 ENCOUNTER — Encounter: Payer: Self-pay | Admitting: Rehabilitative and Restorative Service Providers"

## 2022-08-12 DIAGNOSIS — R252 Cramp and spasm: Secondary | ICD-10-CM

## 2022-08-12 DIAGNOSIS — M542 Cervicalgia: Secondary | ICD-10-CM

## 2022-08-12 DIAGNOSIS — M5030 Other cervical disc degeneration, unspecified cervical region: Secondary | ICD-10-CM | POA: Diagnosis not present

## 2022-08-12 DIAGNOSIS — M6281 Muscle weakness (generalized): Secondary | ICD-10-CM | POA: Diagnosis not present

## 2022-08-12 NOTE — Therapy (Signed)
OUTPATIENT PHYSICAL THERAPY TREATMENT NOTE   Patient Name: Tammy Weeks MRN: [id number] DOB:09/23/1955, 67 y.o., female Today's Date: 08/12/2022  END OF SESSION:  PT End of Session - 08/12/22 1406     Visit Number 4    Date for PT Re-Evaluation 09/16/22    Authorization Type MEDICARE PART A AND B    Progress Note Due on Visit 10    PT Start Time 1400    PT Stop Time 1440    PT Time Calculation (min) 40 min    Activity Tolerance Patient tolerated treatment well    Behavior During Therapy WFL for tasks assessed/performed             Past Medical History:  Diagnosis Date   Anxiety    GERD (gastroesophageal reflux disease)    High cholesterol    Hypertension    Past Surgical History:  Procedure Laterality Date   ABDOMINAL HYSTERECTOMY     ADRENAL GLAND SURGERY     CATARACT EXTRACTION     LAPAROSCOPIC APPENDECTOMY N/A 01/29/2022   Procedure: APPENDECTOMY LAPAROSCOPIC;  Surgeon: Axel Filler, MD;  Location: [facility];  Service: General;  Laterality: N/A;   TRIGGER FINGER RELEASE Left 03/05/2021   Procedure: RELEASE TRIGGER FINGER/A-1 PULLEY LEFT RING FINGER;  Surgeon: Cindee Salt, MD;  Location: [facility];  Service: Orthopedics;  Laterality: Left;  45 MIN   Patient Active Problem List   Diagnosis Date Noted   DDD (degenerative disc disease), cervical 07/18/2022   Lumbosacral spondylosis 05/21/2020   Coughing 01/09/2020   Herpes simplex 07/21/2019   Orbital wrinkles 08/19/2018   Primary osteoarthritis of both knees 07/13/2015   Low grade squamous intraepithelial lesion (LGSIL) on cervicovaginal cytologic smear 03/19/2011    PCP: Georgann Housekeeper, MD  REFERRING PROVIDER: Monica Becton, MD  REFERRING DIAG: M50.30 (ICD-10-CM) - DDD (degenerative disc disease), cervical  THERAPY DIAG:  Cervicalgia  Muscle weakness (generalized)  Cramp and spasm  Rationale for Evaluation and Treatment: Rehabilitation  ONSET DATE:  07/18/2022  SUBJECTIVE:                                                                                                                                                                                                         SUBJECTIVE STATEMENT: Pt reports that she had some soreness after dry needling, but did seem to help relax her muscles some.  Hand dominance: Right  PERTINENT HISTORY:  Evaluate and Treat. 1-2 times per week for 4-6 weeks. Decrease pain, increase strength, flexibility, function, and range of motion. Modalities may  include, traction, ionto, phono, and stim. May include dry needling with or without stim.  PAIN:  PAIN:  Are you having pain? Yes NPRS scale: 2/10 Pain location: cervical and lats PAIN TYPE: aching Pain description: intermittent  Aggravating factors: stress, increased activity Relieving factors: massage   PRECAUTIONS: None  WEIGHT BEARING RESTRICTIONS: No  FALLS:  Has patient fallen in last 6 months? No  LIVING ENVIRONMENT: Lives with: lives with their spouse  OCCUPATION: Hx of sales and traveling  PLOF: Independent, Independent with basic ADLs, Independent with household mobility without device, Independent with community mobility without device, Independent with homemaking with ambulation, Independent with gait, Independent with transfers, Vocation/Vocational requirements: retired , and Leisure: walks, volunteers.   PATIENT GOALS: to eliminate neck and arm pain  NEXT MD VISIT: 09/05/22  OBJECTIVE:   DIAGNOSTIC FINDINGS:  Cervical Radiograph on 07/18/2022: IMPRESSION: 1. Stable lower cervical spondylosis.  No acute bony abnormality.  PATIENT SURVEYS:  Eval:  FOTO 66, goal is 20  COGNITION: Overall cognitive status: Within functional limits for tasks assessed  SENSATION: WFL  POSTURE:  decreased cervical lordosis  PALPATION: Right upper trap, levator scap and cervical paraspinals with trigger points and tight bands   CERVICAL  ROM:   Active ROM A/PROM (deg) eval  Flexion wnl  Extension wnl  Right lateral flexion 50%  Left lateral flexion 50%  Right rotation wnl  Left rotation wnl   (Blank rows = not tested)  UPPER EXTREMITY ROM:  WFL  UPPER EXTREMITY MMT: Generally 4 to 4+/5 grossly throughout UE     TODAY'S TREATMENT:                                                                                                                               DATE: 08/12/2022 UBE level 1.2 x3 min each direction with PT present to discuss status Supine with cervical region on foam:  MELT method for rotation and flexion x20 each Supine cervical retraction 2x10 Supine serratus punch with 2# 2x10 bilat Supine shoulder flexion with 2# 2x10 bilat (with cuing for slow velocity/pacing) Manual Therapy:  soft tissue mobilization using cocoa butter to cervical paraspinals and upper trap region.  Manual suboccipital release and manual cervical traction performed all in supine position.   DATE: 08/04/2022 UBE level 1.0 x3 min each direction with PT present to discuss status Standing facing wall performing 3 way scapular retraction with blue loop x5 bilat, with red tband x5 bilat Standing 4D scapular stabilization with light blue 2# ball x20 each Standing facing wall lower trap lift off 2x10 Standing rows with green tband 2x10 Supine serratus punch with 2# 2x10 bilat Supine shoulder flexion with 2# 2x10 bilat (with cuing for slow velocity/pacing) Trigger Point Dry-Needling  Treatment instructions: Expect mild to moderate muscle soreness. S/S of pneumothorax if dry needled over a lung field, and to seek immediate medical attention should they occur. Patient verbalized understanding of these instructions and education. Patient Consent Given:  Yes Education handout provided: Yes Muscles treated: bilateral upper traps Electrical stimulation performed: No Parameters: N/A Treatment response/outcome: Utilized skilled palpation to  identify bony landmarks and trigger points.  Able to illicit twitch response and muscle elongation.  Soft tissue mobilization following to further promote tissue elongation.      DATE: 07/24/2022 UBE level 1.0 x3 min each direction with PT present to discuss status Standing facing wall performing 3 way scapular retraction with blue loop 2x5 bilat Supine serratus punch with 1# x10.  With 2# 2x10 bilat Prone:  shoulder flexion, horizontal abduction, extension.  X10 bilat Quadruped cat/cow x10 Bear plank 3x15 sec Standing facing wall lower trap lift off 2x10 Standing rows with green tband 2x10 2x 20 pec stretch in doorway Standing L stretch on barre 2x 20 sec    PATIENT EDUCATION:  Education details: Initiated HEP Person educated: Patient Education method: Programmer, multimedia, Facilities manager, Verbal cues, and Handouts Education comprehension: verbalized understanding, returned demonstration, and verbal cues required  HOME EXERCISE PROGRAM: Access Code: [email or web address] URL: [email or web address] Date: 08/04/2022 Prepared by: Clydie Braun Josaiah Muhammed  Exercises - Prone Shoulder Extension - Single Arm  - 2 x daily - 7 x weekly - 2 sets - 10 reps - Prone Shoulder Row  - 2 x daily - 7 x weekly - 2 sets - 10 reps - Prone Single Arm Shoulder Horizontal Abduction with Scapular Retraction and Palm Down  - 2 x daily - 7 x weekly - 2 sets - 10 reps - Sidelying Shoulder External Rotation  - 2 x daily - 7 x weekly - 2 sets - 10 reps - Single Arm Serratus Punches in Supine with Dumbbell  - 2 x daily - 7 x weekly - 2 sets - 10 reps - Cat Cow  - 1 x daily - 7 x weekly - 2 sets - 10 reps - Standing Shoulder Flexion to 90 Degrees with Dumbbells  - 2 x daily - 7 x weekly - 2 sets - 10 reps - Standing Shoulder Scaption  - 2 x daily - 7 x weekly - 2 sets - 10 reps - Wall Clock with Theraband  - 1 x daily - 7 x weekly - 2 sets - 5 reps  ASSESSMENT:  CLINICAL IMPRESSION: Ms Navarro presents to skilled PT  reporting that she did have some soreness following dry needling, but does believe that it helped.  Patient with reports of 2/10 pain today.  Patient with improved technique and body mechanics noted today during session with overall improved posture.  Patient reports that she has been having a cervigogenic headache, so proceeded with manual therapy with subocciptal release and manual traction.  Patient reports decreased pain following manual therapy.  Patient continues to require skilled PT to progress towards goal related activities.  OBJECTIVE IMPAIRMENTS: decreased ROM, decreased strength, increased fascial restrictions, increased muscle spasms, impaired flexibility, postural dysfunction, and pain.   ACTIVITY LIMITATIONS: carrying, lifting, sleeping, transfers, bed mobility, dressing, reach over head, and hygiene/grooming  PARTICIPATION LIMITATIONS: meal prep, cleaning, laundry, driving, shopping, community activity, occupation, and church  PERSONAL FACTORS: Age and Time since onset of injury/illness/exacerbation are also affecting patient's functional outcome.   REHAB POTENTIAL: Good  CLINICAL DECISION MAKING: Stable/uncomplicated  EVALUATION COMPLEXITY: Low   GOALS: Goals reviewed with patient? Yes  SHORT TERM GOALS: Target date: 08/19/2022   Pain report to be no greater than 4/10  Baseline:  Goal status: MET on 7/22  2.  Patient will be independent with initial HEP  Baseline:  Goal  status: MET on 7/22   LONG TERM GOALS: Target date: 09/16/2022   Patient to report pain no greater than 2/10  Baseline:  Goal status: ONGOING  2.  Patient to be independent with advanced HEP  Baseline:  Goal status: ONGOING  3.  Patient to report 85% improvement in overall symptoms Baseline:  Goal status: INITIAL  4.  Patient to be able to sleep through the night  Baseline:  Goal status: INITIAL  5.  FOTO to be 70 Baseline:  Goal status: INITIAL  6.  Patient will be able to reach  overhead into cabinets and on top of shelves without right shoulder or neck pain  Baseline:  Goal status: INITIAL   PLAN:  PT FREQUENCY: 2x/week  PT DURATION: 8 weeks  PLANNED INTERVENTIONS: Therapeutic exercises, Therapeutic activity, Neuromuscular re-education, Balance training, Patient/Family education, Self Care, Joint mobilization, Vestibular training, DME instructions, Aquatic Therapy, Dry Needling, Electrical stimulation, Spinal mobilization, Cryotherapy, Moist heat, scar mobilization, Splintting, Taping, Vasopneumatic device, Traction, Ionotophoresis 4mg /ml Dexamethasone, Manual therapy, and Re-evaluation  PLAN FOR NEXT SESSION: Review HEP, postural strengthening, strengthening, manual therapy/dry needling as needed    Reather Laurence, PT, DPT 08/12/22, 2:43 PM   [facility] [address], [address] [place], [place] [address] Phone # [phone] Fax [phone]

## 2022-08-13 ENCOUNTER — Ambulatory Visit: Payer: Medicare Other | Admitting: Rehabilitative and Restorative Service Providers"

## 2022-08-20 ENCOUNTER — Encounter: Payer: Self-pay | Admitting: Rehabilitative and Restorative Service Providers"

## 2022-08-20 ENCOUNTER — Ambulatory Visit: Payer: Medicare Other | Attending: Sports Medicine | Admitting: Rehabilitative and Restorative Service Providers"

## 2022-08-20 DIAGNOSIS — M542 Cervicalgia: Secondary | ICD-10-CM

## 2022-08-20 DIAGNOSIS — R252 Cramp and spasm: Secondary | ICD-10-CM

## 2022-08-20 DIAGNOSIS — R293 Abnormal posture: Secondary | ICD-10-CM | POA: Insufficient documentation

## 2022-08-20 DIAGNOSIS — M6281 Muscle weakness (generalized): Secondary | ICD-10-CM | POA: Diagnosis present

## 2022-08-20 NOTE — Therapy (Signed)
OUTPATIENT PHYSICAL THERAPY TREATMENT NOTE   Patient Name: Tammy Weeks MRN: [id number] DOB:04-05-1955, 67 y.o., female Today's Date: 08/20/2022  END OF SESSION:  PT End of Session - 08/20/22 1103     Visit Number 5    Date for PT Re-Evaluation 09/16/22    Authorization Type MEDICARE PART A AND B    Progress Note Due on Visit 10    PT Start Time 1100    PT Stop Time 1140    PT Time Calculation (min) 40 min    Activity Tolerance Patient tolerated treatment well    Behavior During Therapy WFL for tasks assessed/performed             Past Medical History:  Diagnosis Date   Anxiety    GERD (gastroesophageal reflux disease)    High cholesterol    Hypertension    Past Surgical History:  Procedure Laterality Date   ABDOMINAL HYSTERECTOMY     ADRENAL GLAND SURGERY     CATARACT EXTRACTION     LAPAROSCOPIC APPENDECTOMY N/A 01/29/2022   Procedure: APPENDECTOMY LAPAROSCOPIC;  Surgeon: Axel Filler, MD;  Location: [facility];  Service: General;  Laterality: N/A;   TRIGGER FINGER RELEASE Left 03/05/2021   Procedure: RELEASE TRIGGER FINGER/A-1 PULLEY LEFT RING FINGER;  Surgeon: Cindee Salt, MD;  Location: [facility];  Service: Orthopedics;  Laterality: Left;  45 MIN   Patient Active Problem List   Diagnosis Date Noted   DDD (degenerative disc disease), cervical 07/18/2022   Lumbosacral spondylosis 05/21/2020   Coughing 01/09/2020   Herpes simplex 07/21/2019   Orbital wrinkles 08/19/2018   Primary osteoarthritis of both knees 07/13/2015   Low grade squamous intraepithelial lesion (LGSIL) on cervicovaginal cytologic smear 03/19/2011    PCP: Georgann Housekeeper, MD  REFERRING PROVIDER: Monica Becton, MD  REFERRING DIAG: M50.30 (ICD-10-CM) - DDD (degenerative disc disease), cervical  THERAPY DIAG:  Cervicalgia  Muscle weakness (generalized)  Cramp and spasm  Rationale for Evaluation and Treatment: Rehabilitation  ONSET DATE:  07/18/2022  SUBJECTIVE:                                                                                                                                                                                                         SUBJECTIVE STATEMENT: Pt reports that she had acupuncture on Monday, so she is feeling better.  States that she has a massage later today.  Hand dominance: Right  PERTINENT HISTORY:  Evaluate and Treat. 1-2 times per week for 4-6 weeks. Decrease pain, increase strength, flexibility, function, and range of  motion. Modalities may include, traction, ionto, phono, and stim. May include dry needling with or without stim.  PAIN:  PAIN:  Are you having pain? Yes NPRS scale: 1/10 Pain location: cervical and lats PAIN TYPE: aching Pain description: intermittent  Aggravating factors: stress, increased activity Relieving factors: massage   PRECAUTIONS: None  WEIGHT BEARING RESTRICTIONS: No  FALLS:  Has patient fallen in last 6 months? No  LIVING ENVIRONMENT: Lives with: lives with their spouse  OCCUPATION: Hx of sales and traveling  PLOF: Independent, Independent with basic ADLs, Independent with household mobility without device, Independent with community mobility without device, Independent with homemaking with ambulation, Independent with gait, Independent with transfers, Vocation/Vocational requirements: retired , and Leisure: walks, volunteers.   PATIENT GOALS: to eliminate neck and arm pain  NEXT MD VISIT: 09/05/22  OBJECTIVE:   DIAGNOSTIC FINDINGS:  Cervical Radiograph on 07/18/2022: IMPRESSION: 1. Stable lower cervical spondylosis.  No acute bony abnormality.  PATIENT SURVEYS:  Eval:  FOTO 66, goal is 33  COGNITION: Overall cognitive status: Within functional limits for tasks assessed  SENSATION: WFL  POSTURE:  decreased cervical lordosis  PALPATION: Right upper trap, levator scap and cervical paraspinals with trigger points and tight  bands   CERVICAL ROM:   Active ROM A/PROM (deg) eval  Flexion wnl  Extension wnl  Right lateral flexion 50%  Left lateral flexion 50%  Right rotation wnl  Left rotation wnl   (Blank rows = not tested)  UPPER EXTREMITY ROM:  WFL  UPPER EXTREMITY MMT: Generally 4 to 4+/5 grossly throughout UE     TODAY'S TREATMENT:                                                                                                                               DATE: 08/20/2022 UBE level 1.2 x3 min each direction with PT present to discuss status Supine with cervical region on foam:  MELT method for rotation and flexion x20 each Supine cervical retraction 2x10 Supine serratus punch with 2# 2x10 bilat Supine shoulder flexion with 2# 2x10 bilat (with cuing for slow velocity/pacing) Supine shoulder horizontal abduction with red tband 2x10 Supine shoulder ER with red tband 2x10 Supine shoulder D2 with red tband x10 bilat   DATE: 08/12/2022 UBE level 1.2 x3 min each direction with PT present to discuss status Supine with cervical region on foam:  MELT method for rotation and flexion x20 each Supine cervical retraction 2x10 Supine serratus punch with 2# 2x10 bilat Supine shoulder flexion with 2# 2x10 bilat (with cuing for slow velocity/pacing) Manual Therapy:  soft tissue mobilization using cocoa butter to cervical paraspinals and upper trap region.  Manual suboccipital release and manual cervical traction performed all in supine position.   DATE: 08/04/2022 UBE level 1.0 x3 min each direction with PT present to discuss status Standing facing wall performing 3 way scapular retraction with blue loop x5 bilat, with red tband x5 bilat Standing 4D scapular stabilization with  light blue 2# ball x20 each Standing facing wall lower trap lift off 2x10 Standing rows with green tband 2x10 Supine serratus punch with 2# 2x10 bilat Supine shoulder flexion with 2# 2x10 bilat (with cuing for slow  velocity/pacing) Trigger Point Dry-Needling  Treatment instructions: Expect mild to moderate muscle soreness. S/S of pneumothorax if dry needled over a lung field, and to seek immediate medical attention should they occur. Patient verbalized understanding of these instructions and education. Patient Consent Given: Yes Education handout provided: Yes Muscles treated: bilateral upper traps Electrical stimulation performed: No Parameters: N/A Treatment response/outcome: Utilized skilled palpation to identify bony landmarks and trigger points.  Able to illicit twitch response and muscle elongation.  Soft tissue mobilization following to further promote tissue elongation.     PATIENT EDUCATION:  Education details: Initiated HEP Person educated: Patient Education method: Programmer, multimedia, Facilities manager, Verbal cues, and Handouts Education comprehension: verbalized understanding, returned demonstration, and verbal cues required  HOME EXERCISE PROGRAM: Access Code: [email or web address] URL: [email or web address] Date: 08/04/2022 Prepared by: Clydie Braun Mivaan Corbitt  Exercises - Prone Shoulder Extension - Single Arm  - 2 x daily - 7 x weekly - 2 sets - 10 reps - Prone Shoulder Row  - 2 x daily - 7 x weekly - 2 sets - 10 reps - Prone Single Arm Shoulder Horizontal Abduction with Scapular Retraction and Palm Down  - 2 x daily - 7 x weekly - 2 sets - 10 reps - Sidelying Shoulder External Rotation  - 2 x daily - 7 x weekly - 2 sets - 10 reps - Single Arm Serratus Punches in Supine with Dumbbell  - 2 x daily - 7 x weekly - 2 sets - 10 reps - Cat Cow  - 1 x daily - 7 x weekly - 2 sets - 10 reps - Standing Shoulder Flexion to 90 Degrees with Dumbbells  - 2 x daily - 7 x weekly - 2 sets - 10 reps - Standing Shoulder Scaption  - 2 x daily - 7 x weekly - 2 sets - 10 reps - Wall Clock with Theraband  - 1 x daily - 7 x weekly - 2 sets - 5 reps  ASSESSMENT:  CLINICAL IMPRESSION: Ms Thresher presents to skilled PT  reporting decreased pain following acupuncture on Monday.  Patient with good response to strengthening exercises.  Anticipate that patient will be able to increase weight on exercises next session.  Patient continues to report decreased pain with MELT method and provided with information for soft foam roller for purchase.  Patient continues to require skilled PT to progress towards goal related activities.  OBJECTIVE IMPAIRMENTS: decreased ROM, decreased strength, increased fascial restrictions, increased muscle spasms, impaired flexibility, postural dysfunction, and pain.   ACTIVITY LIMITATIONS: carrying, lifting, sleeping, transfers, bed mobility, dressing, reach over head, and hygiene/grooming  PARTICIPATION LIMITATIONS: meal prep, cleaning, laundry, driving, shopping, community activity, occupation, and church  PERSONAL FACTORS: Age and Time since onset of injury/illness/exacerbation are also affecting patient's functional outcome.   REHAB POTENTIAL: Good  CLINICAL DECISION MAKING: Stable/uncomplicated  EVALUATION COMPLEXITY: Low   GOALS: Goals reviewed with patient? Yes  SHORT TERM GOALS: Target date: 08/19/2022   Pain report to be no greater than 4/10  Baseline:  Goal status: MET on 7/22  2.  Patient will be independent with initial HEP  Baseline:  Goal status: MET on 7/22   LONG TERM GOALS: Target date: 09/16/2022   Patient to report pain no greater than 2/10  Baseline:  Goal status: ONGOING  2.  Patient to be independent with advanced HEP  Baseline:  Goal status: ONGOING  3.  Patient to report 85% improvement in overall symptoms Baseline:  Goal status: INITIAL  4.  Patient to be able to sleep through the night  Baseline:  Goal status: INITIAL  5.  FOTO to be 70 Baseline:  Goal status: INITIAL  6.  Patient will be able to reach overhead into cabinets and on top of shelves without right shoulder or neck pain  Baseline:  Goal status: INITIAL   PLAN:  PT  FREQUENCY: 2x/week  PT DURATION: 8 weeks  PLANNED INTERVENTIONS: Therapeutic exercises, Therapeutic activity, Neuromuscular re-education, Balance training, Patient/Family education, Self Care, Joint mobilization, Vestibular training, DME instructions, Aquatic Therapy, Dry Needling, Electrical stimulation, Spinal mobilization, Cryotherapy, Moist heat, scar mobilization, Splintting, Taping, Vasopneumatic device, Traction, Ionotophoresis 4mg /ml Dexamethasone, Manual therapy, and Re-evaluation  PLAN FOR NEXT SESSION: Review HEP, postural strengthening, strengthening, manual therapy/dry needling as needed    Reather Laurence, PT, DPT 08/20/22, 12:00 PM   [facility] [address], [address] [place], [place] [address] Phone # [phone] Fax [phone]

## 2022-09-01 ENCOUNTER — Encounter: Payer: Self-pay | Admitting: Rehabilitative and Restorative Service Providers"

## 2022-09-01 ENCOUNTER — Ambulatory Visit: Payer: Medicare Other | Admitting: Rehabilitative and Restorative Service Providers"

## 2022-09-01 DIAGNOSIS — M542 Cervicalgia: Secondary | ICD-10-CM

## 2022-09-01 DIAGNOSIS — R252 Cramp and spasm: Secondary | ICD-10-CM | POA: Diagnosis not present

## 2022-09-01 DIAGNOSIS — M6281 Muscle weakness (generalized): Secondary | ICD-10-CM | POA: Diagnosis not present

## 2022-09-01 DIAGNOSIS — R293 Abnormal posture: Secondary | ICD-10-CM | POA: Diagnosis not present

## 2022-09-01 NOTE — Therapy (Signed)
OUTPATIENT PHYSICAL THERAPY TREATMENT NOTE   Patient Name: Tammy Weeks MRN: [id number] DOB:[birth year], 67 y.o., female Today's Date: 09/01/2022  END OF SESSION:  PT End of Session - 09/01/22 1100     Visit Number 6    Date for PT Re-Evaluation 09/16/22    Authorization Type MEDICARE PART A AND B    Progress Note Due on Visit 10    PT Start Time 1058    PT Stop Time 1136    PT Time Calculation (min) 38 min    Activity Tolerance Patient tolerated treatment well    Behavior During Therapy WFL for tasks assessed/performed             Past Medical History:  Diagnosis Date   Anxiety    GERD (gastroesophageal reflux disease)    High cholesterol    Hypertension    Past Surgical History:  Procedure Laterality Date   ABDOMINAL HYSTERECTOMY     ADRENAL GLAND SURGERY     CATARACT EXTRACTION     LAPAROSCOPIC APPENDECTOMY N/A 01/29/2022   Procedure: APPENDECTOMY LAPAROSCOPIC;  Surgeon: Axel Filler, MD;  Location: [facility];  Service: General;  Laterality: N/A;   TRIGGER FINGER RELEASE Left 03/05/2021   Procedure: RELEASE TRIGGER FINGER/A-1 PULLEY LEFT RING FINGER;  Surgeon: Cindee Salt, MD;  Location: [facility];  Service: Orthopedics;  Laterality: Left;  45 MIN   Patient Active Problem List   Diagnosis Date Noted   DDD (degenerative disc disease), cervical 07/18/2022   Lumbosacral spondylosis 05/21/2020   Coughing 01/09/2020   Herpes simplex 07/21/2019   Orbital wrinkles 08/19/2018   Primary osteoarthritis of both knees 07/13/2015   Low grade squamous intraepithelial lesion (LGSIL) on cervicovaginal cytologic smear 03/19/2011    PCP: Georgann Housekeeper, MD  REFERRING PROVIDER: Monica Becton, MD  REFERRING DIAG: M50.30 (ICD-10-CM) - DDD (degenerative disc disease), cervical  THERAPY DIAG:  Cervicalgia  Muscle weakness (generalized)  Cramp and spasm  Rationale for Evaluation and Treatment: Rehabilitation  ONSET DATE:  07/18/2022  SUBJECTIVE:                                                                                                                                                                                                         SUBJECTIVE STATEMENT: "I just finished working at [employer], so I was doing a lot."  Pt reports increased pain on Saturday after driving increased distance.  Pt reports that she got a soft foam roll to use at home.  Hand dominance: Right  PERTINENT HISTORY:  Evaluate and Treat.  1-2 times per week for 4-6 weeks. Decrease pain, increase strength, flexibility, function, and range of motion. Modalities may include, traction, ionto, phono, and stim. May include dry needling with or without stim.  PAIN:  PAIN:  Are you having pain? Yes NPRS scale: 2/10 Pain location: cervical and lats PAIN TYPE: aching Pain description: intermittent  Aggravating factors: stress, increased activity Relieving factors: massage   PRECAUTIONS: None  WEIGHT BEARING RESTRICTIONS: No  FALLS:  Has patient fallen in last 6 months? No  LIVING ENVIRONMENT: Lives with: lives with their spouse  OCCUPATION: Hx of sales and traveling  PLOF: Independent, Independent with basic ADLs, Independent with household mobility without device, Independent with community mobility without device, Independent with homemaking with ambulation, Independent with gait, Independent with transfers, Vocation/Vocational requirements: retired , and Leisure: walks, volunteers.   PATIENT GOALS: to eliminate neck and arm pain  NEXT MD VISIT: 09/05/22  OBJECTIVE:   DIAGNOSTIC FINDINGS:  Cervical Radiograph on 07/18/2022: IMPRESSION: 1. Stable lower cervical spondylosis.  No acute bony abnormality.  PATIENT SURVEYS:  Eval:  FOTO 66, goal is 30  COGNITION: Overall cognitive status: Within functional limits for tasks assessed  SENSATION: WFL  POSTURE:  decreased cervical lordosis  PALPATION: Right  upper trap, levator scap and cervical paraspinals with trigger points and tight bands   CERVICAL ROM:   Active ROM A/PROM (deg) eval  Flexion wnl  Extension wnl  Right lateral flexion 50%  Left lateral flexion 50%  Right rotation wnl  Left rotation wnl   (Blank rows = not tested)  UPPER EXTREMITY ROM:  WFL  UPPER EXTREMITY MMT: Generally 4 to 4+/5 grossly throughout UE     TODAY'S TREATMENT:                                                                                                                               DATE: 09/01/2022 UBE level 1.2 x3 min each direction with PT present to discuss status Cervical SNAGs 2x10 bilat Supine with cervical region on foam:  MELT method for rotation and flexion x20 each Sidelying Open Book with soft foam roll x10 bilat Hooklying on soft foam roll x1 min Hooklying on soft foam roll performing shoulder horizontal abduction with red tband 2x10 Hooklying on soft foam roll performing shoulder D2 extension 2x10 bilat Trigger Point Dry-Needling  Treatment instructions: Expect mild to moderate muscle soreness. S/S of pneumothorax if dry needled over a lung field, and to seek immediate medical attention should they occur. Patient verbalized understanding of these instructions and education. Patient Consent Given: Yes Education handout provided: Previously provided Muscles treated: right upper trap and right rhomboid Electrical stimulation performed: No Parameters: N/A Treatment response/outcome: Utilized skilled palpation to identify bony landmarks and trigger points.  Able to illicit twitch response and muscle elongation.  Soft tissue mobilization following to further promote tissue elongation.      DATE: 08/20/2022 UBE level 1.2 x3 min each direction with PT present  to discuss status Supine with cervical region on foam:  MELT method for rotation and flexion x20 each Supine cervical retraction 2x10 Supine serratus punch with 2# 2x10  bilat Supine shoulder flexion with 2# 2x10 bilat (with cuing for slow velocity/pacing) Supine shoulder horizontal abduction with red tband 2x10 Supine shoulder ER with red tband 2x10 Supine shoulder D2 with red tband x10 bilat   DATE: 08/12/2022 UBE level 1.2 x3 min each direction with PT present to discuss status Supine with cervical region on foam:  MELT method for rotation and flexion x20 each Supine cervical retraction 2x10 Supine serratus punch with 2# 2x10 bilat Supine shoulder flexion with 2# 2x10 bilat (with cuing for slow velocity/pacing) Manual Therapy:  soft tissue mobilization using cocoa butter to cervical paraspinals and upper trap region.  Manual suboccipital release and manual cervical traction performed all in supine position.      PATIENT EDUCATION:  Education details: Initiated HEP Person educated: Patient Education method: Programmer, multimedia, Facilities manager, Verbal cues, and Handouts Education comprehension: verbalized understanding, returned demonstration, and verbal cues required  HOME EXERCISE PROGRAM: Access Code: [email or web address] URL: [email or web address] Date: 09/01/2022 Prepared by: Clydie Braun Libero Puthoff  Exercises - Prone Shoulder Extension - Single Arm  - 2 x daily - 7 x weekly - 2 sets - 10 reps - Prone Shoulder Row  - 2 x daily - 7 x weekly - 2 sets - 10 reps - Prone Single Arm Shoulder Horizontal Abduction with Scapular Retraction and Palm Down  - 2 x daily - 7 x weekly - 2 sets - 10 reps - Sidelying Shoulder External Rotation  - 2 x daily - 7 x weekly - 2 sets - 10 reps - Single Arm Serratus Punches in Supine with Dumbbell  - 2 x daily - 7 x weekly - 2 sets - 10 reps - Cat Cow  - 1 x daily - 7 x weekly - 2 sets - 10 reps - Standing Shoulder Flexion to 90 Degrees with Dumbbells  - 2 x daily - 7 x weekly - 2 sets - 10 reps - Standing Shoulder Scaption  - 2 x daily - 7 x weekly - 2 sets - 10 reps - Wall Clock with Theraband  - 1 x daily - 7 x weekly - 2  sets - 5 reps - Neck Mobilization with Foam Roller  - 1 x daily - 7 x weekly - 2 sets - 10 reps - Sidelying Open Book Thoracic Rotation with Knee on Foam Roll  - 1 x daily - 7 x weekly - 1-2 sets - 10 reps - Supine Shoulder External Rotation on Foam Roll with Theraband  - 1 x daily - 7 x weekly - 2 sets - 10 reps - Hooklying Scapular Protraction on Foam Roll  - 1 x daily - 7 x weekly - 2 sets - 10 reps  ASSESSMENT:  CLINICAL IMPRESSION: Ms Strollo presents to skilled PT reporting approximately 50% improvements.  Pt reports overall that her sleeping has improved and generally, she only wakes up once during the night.  She is agreeable to dry needling today, as it helped some with her increased pain last time she had it.  Patient wanted more exercises to perform at home with her new foam roll, so added exercises in clinic and for HEP.  Patient continues to require skilled PT to progress towards goal related activities.  OBJECTIVE IMPAIRMENTS: decreased ROM, decreased strength, increased fascial restrictions, increased muscle spasms, impaired flexibility, postural dysfunction, and pain.   ACTIVITY  LIMITATIONS: carrying, lifting, sleeping, transfers, bed mobility, dressing, reach over head, and hygiene/grooming  PARTICIPATION LIMITATIONS: meal prep, cleaning, laundry, driving, shopping, community activity, occupation, and church  PERSONAL FACTORS: Age and Time since onset of injury/illness/exacerbation are also affecting patient's functional outcome.   REHAB POTENTIAL: Good  CLINICAL DECISION MAKING: Stable/uncomplicated  EVALUATION COMPLEXITY: Low   GOALS: Goals reviewed with patient? Yes  SHORT TERM GOALS: Target date: 08/19/2022   Pain report to be no greater than 4/10  Baseline:  Goal status: MET on 7/22  2.  Patient will be independent with initial HEP  Baseline:  Goal status: MET on 7/22   LONG TERM GOALS: Target date: 09/16/2022   Patient to report pain no greater than 2/10   Baseline:  Goal status: ONGOING  2.  Patient to be independent with advanced HEP  Baseline:  Goal status: ONGOING  3.  Patient to report 85% improvement in overall symptoms Baseline:  Goal status: Ongoing (on 09/01/22 reports 50% improvement)  4.  Patient to be able to sleep through the night  Baseline:  Goal status: Ongoing  5.  FOTO to be 70 Baseline:  Goal status: INITIAL  6.  Patient will be able to reach overhead into cabinets and on top of shelves without right shoulder or neck pain  Baseline:  Goal status: INITIAL   PLAN:  PT FREQUENCY: 2x/week  PT DURATION: 8 weeks  PLANNED INTERVENTIONS: Therapeutic exercises, Therapeutic activity, Neuromuscular re-education, Balance training, Patient/Family education, Self Care, Joint mobilization, Vestibular training, DME instructions, Aquatic Therapy, Dry Needling, Electrical stimulation, Spinal mobilization, Cryotherapy, Moist heat, scar mobilization, Splintting, Taping, Vasopneumatic device, Traction, Ionotophoresis 4mg /ml Dexamethasone, Manual therapy, and Re-evaluation  PLAN FOR NEXT SESSION: Review HEP, postural strengthening, strengthening, manual therapy/dry needling as needed    Reather Laurence, PT, DPT 09/01/22, 12:02 PM   [facility] [address] [place], [place] [address] Phone # [phone] Fax [phone]

## 2022-09-03 ENCOUNTER — Ambulatory Visit: Payer: Medicare Other

## 2022-09-03 DIAGNOSIS — M6281 Muscle weakness (generalized): Secondary | ICD-10-CM | POA: Diagnosis not present

## 2022-09-03 DIAGNOSIS — M542 Cervicalgia: Secondary | ICD-10-CM

## 2022-09-03 DIAGNOSIS — R293 Abnormal posture: Secondary | ICD-10-CM | POA: Diagnosis not present

## 2022-09-03 DIAGNOSIS — R252 Cramp and spasm: Secondary | ICD-10-CM | POA: Diagnosis not present

## 2022-09-03 NOTE — Therapy (Signed)
OUTPATIENT PHYSICAL THERAPY TREATMENT NOTE   Patient Name: Tammy Weeks MRN: [id number] DOB:21-Feb-1955, 67 y.o., female Today's Date: 09/03/2022  END OF SESSION:  PT End of Session - 09/03/22 1316     Visit Number 7    Date for PT Re-Evaluation 09/16/22    Authorization Type MEDICARE PART A AND B    Progress Note Due on Visit 10    PT Start Time 0930    PT Stop Time 1013    PT Time Calculation (min) 43 min    Activity Tolerance Patient tolerated treatment well    Behavior During Therapy WFL for tasks assessed/performed              Past Medical History:  Diagnosis Date   Anxiety    GERD (gastroesophageal reflux disease)    High cholesterol    Hypertension    Past Surgical History:  Procedure Laterality Date   ABDOMINAL HYSTERECTOMY     ADRENAL GLAND SURGERY     CATARACT EXTRACTION     LAPAROSCOPIC APPENDECTOMY N/A 01/29/2022   Procedure: APPENDECTOMY LAPAROSCOPIC;  Surgeon: Axel Filler, MD;  Location: [facility];  Service: General;  Laterality: N/A;   TRIGGER FINGER RELEASE Left 03/05/2021   Procedure: RELEASE TRIGGER FINGER/A-1 PULLEY LEFT RING FINGER;  Surgeon: Cindee Salt, MD;  Location: [facility];  Service: Orthopedics;  Laterality: Left;  45 MIN   Patient Active Problem List   Diagnosis Date Noted   DDD (degenerative disc disease), cervical 07/18/2022   Lumbosacral spondylosis 05/21/2020   Coughing 01/09/2020   Herpes simplex 07/21/2019   Orbital wrinkles 08/19/2018   Primary osteoarthritis of both knees 07/13/2015   Low grade squamous intraepithelial lesion (LGSIL) on cervicovaginal cytologic smear 03/19/2011    PCP: Georgann Housekeeper, MD  REFERRING PROVIDER: Monica Becton, MD  REFERRING DIAG: M50.30 (ICD-10-CM) - DDD (degenerative disc disease), cervical  THERAPY DIAG:  Cervicalgia  Muscle weakness (generalized)  Cramp and spasm  Abnormal posture  Rationale for Evaluation and Treatment: Rehabilitation  ONSET  DATE: 07/18/2022  SUBJECTIVE:                                                                                                                                                                                                         SUBJECTIVE STATEMENT: "I'm doing goofd  Hand dominance: Right  PERTINENT HISTORY:  Evaluate and Treat. 1-2 times per week for 4-6 weeks. Decrease pain, increase strength, flexibility, function, and range of motion. Modalities may include, traction, ionto, phono, and stim. May include dry needling with or  without stim.  PAIN:  PAIN:  Are you having pain? Yes NPRS scale: 1- 2/10 Pain location: cervical and lats PAIN TYPE: aching Pain description: intermittent  Aggravating factors: stress, increased activity Relieving factors: massage   PRECAUTIONS: None  WEIGHT BEARING RESTRICTIONS: No  FALLS:  Has patient fallen in last 6 months? No  LIVING ENVIRONMENT: Lives with: lives with their spouse  OCCUPATION: Hx of sales and traveling  PLOF: Independent, Independent with basic ADLs, Independent with household mobility without device, Independent with community mobility without device, Independent with homemaking with ambulation, Independent with gait, Independent with transfers, Vocation/Vocational requirements: retired , and Leisure: walks, volunteers.   PATIENT GOALS: to eliminate neck and arm pain  NEXT MD VISIT: 09/05/22  OBJECTIVE:   DIAGNOSTIC FINDINGS:  Cervical Radiograph on 07/18/2022: IMPRESSION: 1. Stable lower cervical spondylosis.  No acute bony abnormality.  PATIENT SURVEYS:  Eval:  FOTO 66, goal is 50  COGNITION: Overall cognitive status: Within functional limits for tasks assessed  SENSATION: WFL  POSTURE:  decreased cervical lordosis  PALPATION: Right upper trap, levator scap and cervical paraspinals with trigger points and tight bands   CERVICAL ROM:   Active ROM A/PROM (deg) eval  Flexion wnl  Extension wnl  Right  lateral flexion 50%  Left lateral flexion 50%  Right rotation wnl  Left rotation wnl   (Blank rows = not tested)  UPPER EXTREMITY ROM:  WFL  UPPER EXTREMITY MMT: Generally 4 to 4+/5 grossly throughout UE     TODAY'S TREATMENT:                                                                                                                              DATE: 09/03/2022 UBE not available, did Nustep with PT present to discuss status x 5 min Cervical SNAGs 2x10 bilat Supine with cervical region on foam:  MELT method for rotation and flexion x20 each Sidelying Open Book with soft foam roll x10 bilat Hooklying on soft foam roll x1 min Hooklying on soft foam roll performing shoulder horizontal abduction with red tband 2x10 Hooklying on soft foam roll performing shoulder D2 extension 2x10 bilat  DATE: 09/01/2022 UBE level 1.2 x3 min each direction with PT present to discuss status Cervical SNAGs 2x10 bilat Supine with cervical region on foam:  MELT method for rotation and flexion x20 each Sidelying Open Book with soft foam roll x10 bilat Hooklying on soft foam roll x1 min Hooklying on soft foam roll performing shoulder horizontal abduction with red tband 2x10 Hooklying on soft foam roll performing shoulder D2 extension 2x10 bilat Trigger Point Dry-Needling  Treatment instructions: Expect mild to moderate muscle soreness. S/S of pneumothorax if dry needled over a lung field, and to seek immediate medical attention should they occur. Patient verbalized understanding of these instructions and education. Patient Consent Given: Yes Education handout provided: Previously provided Muscles treated: right upper trap and right rhomboid Electrical stimulation performed: No Parameters: N/A Treatment response/outcome: Utilized  skilled palpation to identify bony landmarks and trigger points.  Able to illicit twitch response and muscle elongation.  Soft tissue mobilization following to further promote  tissue elongation.      DATE: 08/20/2022 UBE level 1.2 x3 min each direction with PT present to discuss status Supine with cervical region on foam:  MELT method for rotation and flexion x20 each Supine cervical retraction 2x10 Supine serratus punch with 2# 2x10 bilat Supine shoulder flexion with 2# 2x10 bilat (with cuing for slow velocity/pacing) Supine shoulder horizontal abduction with red tband 2x10 Supine shoulder ER with red tband 2x10 Supine shoulder D2 with red tband x10 bilat   DATE: 08/12/2022 UBE level 1.2 x3 min each direction with PT present to discuss status Supine with cervical region on foam:  MELT method for rotation and flexion x20 each Supine cervical retraction 2x10 Supine serratus punch with 2# 2x10 bilat Supine shoulder flexion with 2# 2x10 bilat (with cuing for slow velocity/pacing) Manual Therapy:  soft tissue mobilization using cocoa butter to cervical paraspinals and upper trap region.  Manual suboccipital release and manual cervical traction performed all in supine position.      PATIENT EDUCATION:  Education details: Initiated HEP Person educated: Patient Education method: Programmer, multimedia, Facilities manager, Verbal cues, and Handouts Education comprehension: verbalized understanding, returned demonstration, and verbal cues required  HOME EXERCISE PROGRAM: Access Code: [email or web address] URL: [email or web address] Date: 09/03/2022 Prepared by: Mikey Kirschner  Exercises - Prone Shoulder Extension - Single Arm  - 2 x daily - 7 x weekly - 2 sets - 10 reps - Prone Shoulder Row  - 2 x daily - 7 x weekly - 2 sets - 10 reps - Prone Single Arm Shoulder Horizontal Abduction with Scapular Retraction and Palm Down  - 2 x daily - 7 x weekly - 2 sets - 10 reps - Sidelying Shoulder External Rotation  - 2 x daily - 7 x weekly - 2 sets - 10 reps - Single Arm Serratus Punches in Supine with Dumbbell  - 2 x daily - 7 x weekly - 2 sets - 10 reps - Cat Cow  - 1 x daily -  7 x weekly - 2 sets - 10 reps - Standing Shoulder Flexion to 90 Degrees with Dumbbells  - 2 x daily - 7 x weekly - 2 sets - 10 reps - Standing Shoulder Scaption  - 2 x daily - 7 x weekly - 2 sets - 10 reps - Wall Clock with Theraband  - 1 x daily - 7 x weekly - 2 sets - 5 reps - Neck Mobilization with Foam Roller  - 1 x daily - 7 x weekly - 2 sets - 10 reps - Sidelying Open Book Thoracic Rotation with Knee on Foam Roll  - 1 x daily - 7 x weekly - 1-2 sets - 10 reps - Supine Shoulder External Rotation on Foam Roll with Theraband  - 1 x daily - 7 x weekly - 2 sets - 10 reps - Hooklying Scapular Protraction on Foam Roll  - 1 x daily - 7 x weekly - 2 sets - 10 reps - Seated Assisted Cervical Rotation with Towel  - 1 x daily - 7 x weekly - 1 sets - 10 reps - 2-3 sec hold  ASSESSMENT:  CLINICAL IMPRESSION: Tammy Weeks is responded well to current treatment regimen.  She is independent and compliant with her HEP.  She has good return of cervical ROM. She should continue to do well.   Patient continues to  require skilled PT to progress towards goal related activities.  OBJECTIVE IMPAIRMENTS: decreased ROM, decreased strength, increased fascial restrictions, increased muscle spasms, impaired flexibility, postural dysfunction, and pain.   ACTIVITY LIMITATIONS: carrying, lifting, sleeping, transfers, bed mobility, dressing, reach over head, and hygiene/grooming  PARTICIPATION LIMITATIONS: meal prep, cleaning, laundry, driving, shopping, community activity, occupation, and church  PERSONAL FACTORS: Age and Time since onset of injury/illness/exacerbation are also affecting patient's functional outcome.   REHAB POTENTIAL: Good  CLINICAL DECISION MAKING: Stable/uncomplicated  EVALUATION COMPLEXITY: Low   GOALS: Goals reviewed with patient? Yes  SHORT TERM GOALS: Target date: 08/19/2022   Pain report to be no greater than 4/10  Baseline:  Goal status: MET on 7/22  2.  Patient will be independent  with initial HEP  Baseline:  Goal status: MET on 7/22   LONG TERM GOALS: Target date: 09/16/2022   Patient to report pain no greater than 2/10  Baseline:  Goal status: ONGOING  2.  Patient to be independent with advanced HEP  Baseline:  Goal status: ONGOING  3.  Patient to report 85% improvement in overall symptoms Baseline:  Goal status: Ongoing (on 09/01/22 reports 50% improvement)  4.  Patient to be able to sleep through the night  Baseline:  Goal status: Ongoing  5.  FOTO to be 68 Baseline:  Goal status: INITIAL  6.  Patient will be able to reach overhead into cabinets and on top of shelves without right shoulder or neck pain  Baseline:  Goal status: INITIAL   PLAN:  PT FREQUENCY: 2x/week  PT DURATION: 8 weeks  PLANNED INTERVENTIONS: Therapeutic exercises, Therapeutic activity, Neuromuscular re-education, Balance training, Patient/Family education, Self Care, Joint mobilization, Vestibular training, DME instructions, Aquatic Therapy, Dry Needling, Electrical stimulation, Spinal mobilization, Cryotherapy, Moist heat, scar mobilization, Splintting, Taping, Vasopneumatic device, Traction, Ionotophoresis 4mg /ml Dexamethasone, Manual therapy, and Re-evaluation  PLAN FOR NEXT SESSION: Progress HEP to include MELT, postural strengthening, strengthening, manual therapy/dry needling as needed   Avary Eichenberger B. Arhum Peeples, PT 09/03/22 1:20 PM [facility] [address] [place], [place] [address] Phone # [phone] Fax [phone]

## 2022-09-05 ENCOUNTER — Encounter: Payer: Self-pay | Admitting: Sports Medicine

## 2022-09-05 ENCOUNTER — Ambulatory Visit (INDEPENDENT_AMBULATORY_CARE_PROVIDER_SITE_OTHER): Payer: Medicare Other | Admitting: Sports Medicine

## 2022-09-05 DIAGNOSIS — M5030 Other cervical disc degeneration, unspecified cervical region: Secondary | ICD-10-CM

## 2022-09-05 NOTE — Assessment & Plan Note (Signed)
This is a very pleasant 67 year old female, she has long history of pain at right side of the neck with radiation to the periscapular region, axilla, numbness and tingling first and second fingers right hand. A 5-day burst of prednisone and physical therapy seems to have done the trick, return to see me as needed.

## 2022-09-05 NOTE — Progress Notes (Signed)
    Procedures performed today:    None.  Independent interpretation of notes and tests performed by another provider:   None.  Brief History, Exam, Impression, and Recommendations:    DDD (degenerative disc disease), cervical This is a very pleasant 67 year old female, she has long history of pain at right side of the neck with radiation to the periscapular region, axilla, numbness and tingling first and second fingers right hand. A 5-day burst of prednisone and physical therapy seems to have done the trick, return to see me as needed.    ____________________________________________ Ihor Austin. Benjamin Stain, M.D., ABFM., CAQSM., AME. Primary Care and Sports Medicine [facility]  Adjunct Professor of Family Medicine  [place] [employer]  Restaurant manager, fast food

## 2022-09-09 ENCOUNTER — Ambulatory Visit: Payer: Medicare Other

## 2022-09-11 ENCOUNTER — Ambulatory Visit: Payer: Medicare Other

## 2022-09-11 DIAGNOSIS — M542 Cervicalgia: Secondary | ICD-10-CM | POA: Diagnosis not present

## 2022-09-11 DIAGNOSIS — R293 Abnormal posture: Secondary | ICD-10-CM | POA: Diagnosis not present

## 2022-09-11 DIAGNOSIS — M6281 Muscle weakness (generalized): Secondary | ICD-10-CM | POA: Diagnosis not present

## 2022-09-11 DIAGNOSIS — R252 Cramp and spasm: Secondary | ICD-10-CM | POA: Diagnosis not present

## 2022-09-11 NOTE — Therapy (Signed)
OUTPATIENT PHYSICAL THERAPY TREATMENT NOTE   Patient Name: Tammy Weeks MRN: [id number] DOB:1955-04-12, 67 y.o., female Today's Date: 09/11/2022  END OF SESSION:  PT End of Session - 09/11/22 1019     Visit Number 8    Date for PT Re-Evaluation 09/16/22    Authorization Type MEDICARE PART A AND B    PT Start Time 1015    PT Stop Time 1100    PT Time Calculation (min) 45 min    Activity Tolerance Patient tolerated treatment well    Behavior During Therapy WFL for tasks assessed/performed              Past Medical History:  Diagnosis Date   Anxiety    GERD (gastroesophageal reflux disease)    High cholesterol    Hypertension    Past Surgical History:  Procedure Laterality Date   ABDOMINAL HYSTERECTOMY     ADRENAL GLAND SURGERY     CATARACT EXTRACTION     LAPAROSCOPIC APPENDECTOMY N/A 01/29/2022   Procedure: APPENDECTOMY LAPAROSCOPIC;  Surgeon: Axel Filler, MD;  Location: [facility];  Service: General;  Laterality: N/A;   TRIGGER FINGER RELEASE Left 03/05/2021   Procedure: RELEASE TRIGGER FINGER/A-1 PULLEY LEFT RING FINGER;  Surgeon: Cindee Salt, MD;  Location: [facility];  Service: Orthopedics;  Laterality: Left;  45 MIN   Patient Active Problem List   Diagnosis Date Noted   DDD (degenerative disc disease), cervical 07/18/2022   Lumbosacral spondylosis 05/21/2020   Coughing 01/09/2020   Herpes simplex 07/21/2019   Orbital wrinkles 08/19/2018   Primary osteoarthritis of both knees 07/13/2015   Low grade squamous intraepithelial lesion (LGSIL) on cervicovaginal cytologic smear 03/19/2011    PCP: Georgann Housekeeper, MD  REFERRING PROVIDER: Monica Becton, MD  REFERRING DIAG: M50.30 (ICD-10-CM) - DDD (degenerative disc disease), cervical  THERAPY DIAG:  Cervicalgia - Plan: PT plan of care cert/re-cert  Muscle weakness (generalized) - Plan: PT plan of care cert/re-cert  Cramp and spasm - Plan: PT plan of care cert/re-cert  Rationale  for Evaluation and Treatment: Rehabilitation  ONSET DATE: 07/18/2022  SUBJECTIVE:                                                                                                                                                                                                         SUBJECTIVE STATEMENT: Patient reports no major changes.  I still feel like I am doing pretty good.  "We are taking a trip next week and there will be a lot of driving  Hand dominance: Right  PERTINENT HISTORY:  Evaluate and Treat. 1-2 times per week for 4-6 weeks. Decrease pain, increase strength, flexibility, function, and range of motion. Modalities may include, traction, ionto, phono, and stim. May include dry needling with or without stim.  PAIN:  PAIN:  Are you having pain? Yes NPRS scale: 1/10 Pain location: cervical and lats PAIN TYPE: aching Pain description: intermittent  Aggravating factors: stress, increased activity Relieving factors: massage   PRECAUTIONS: None  WEIGHT BEARING RESTRICTIONS: No  FALLS:  Has patient fallen in last 6 months? No  LIVING ENVIRONMENT: Lives with: lives with their spouse  OCCUPATION: Hx of sales and traveling  PLOF: Independent, Independent with basic ADLs, Independent with household mobility without device, Independent with community mobility without device, Independent with homemaking with ambulation, Independent with gait, Independent with transfers, Vocation/Vocational requirements: retired , and Leisure: walks, volunteers.   PATIENT GOALS: to eliminate neck and arm pain  NEXT MD VISIT: 09/05/22  OBJECTIVE:   DIAGNOSTIC FINDINGS:  Cervical Radiograph on 07/18/2022: IMPRESSION: 1. Stable lower cervical spondylosis.  No acute bony abnormality.  PATIENT SURVEYS:  Eval:  FOTO 66, goal is 70   09/11/22: FOTO 61, goal is 70  COGNITION: Overall cognitive status: Within functional limits for tasks assessed  SENSATION: WFL  POSTURE:  decreased  cervical lordosis  PALPATION: Right upper trap, levator scap and cervical paraspinals with trigger points and tight bands   CERVICAL ROM:   Active ROM A/PROM (deg) eval A/PROM (deg) eval  Flexion wnl WNL  Extension wnl WNL  Right lateral flexion 50% WNL  Left lateral flexion 50% WNL  Right rotation wnl WNL  Left rotation wnl WNL   (Blank rows = not tested)  UPPER EXTREMITY ROM:  WFL  UPPER EXTREMITY MMT: Eval: Generally 4 to 4+/5 grossly throughout UE  09/11/22: 5/5 bilateral Ue's without c/o pain  TODAY'S TREATMENT:                                                                                                                              DATE: 09/11/2022 UBE x 6 min 3/3 Re-assessment for recert 3 way scapular stabilization with blue loop (patient wanted to try more resistance) gave her yellow loop, she could not complete all reps and resumed the blue loop) x 10 each UE 4 D ball rolls x 20 each with light blue plyo ball Prone shoulder ext, rows and horizontal abduction x 20 each UE with 3# Side lying shoulder ER with 3# x 20 both Supine serratus punch x 20 with 3# both Trigger Point Dry-Needling  Treatment instructions: Expect mild to moderate muscle soreness. S/S of pneumothorax if dry needled over a lung field, and to seek immediate medical attention should they occur. Patient verbalized understanding of these instructions and education. Patient Consent Given: Yes Education handout provided: Previously provided Muscles treated: bilateral upper traps Electrical stimulation performed: No Parameters: N/A Treatment response/outcome: Skilled palpation used to identify taut bands and trigger points.  Once identified, dry needling techniques used to treat these areas.  Twitch response ellicited along with palpable elongation of muscle.  Following treatment, patient reported decreased pain and muscle tension in bilateral upper traps and in her neck.     DATE: 09/03/2022 UBE not  available, did Nustep with PT present to discuss status x 5 min Cervical SNAGs 2x10 bilat Supine with cervical region on foam:  MELT method for rotation and flexion x20 each Sidelying Open Book with soft foam roll x10 bilat Hooklying on soft foam roll x1 min Hooklying on soft foam roll performing shoulder horizontal abduction with red tband 2x10 Hooklying on soft foam roll performing shoulder D2 extension 2x10 bilat  DATE: 09/01/2022 UBE level 1.2 x3 min each direction with PT present to discuss status Cervical SNAGs 2x10 bilat Supine with cervical region on foam:  MELT method for rotation and flexion x20 each Sidelying Open Book with soft foam roll x10 bilat Hooklying on soft foam roll x1 min Hooklying on soft foam roll performing shoulder horizontal abduction with red tband 2x10 Hooklying on soft foam roll performing shoulder D2 extension 2x10 bilat Trigger Point Dry-Needling  Treatment instructions: Expect mild to moderate muscle soreness. S/S of pneumothorax if dry needled over a lung field, and to seek immediate medical attention should they occur. Patient verbalized understanding of these instructions and education. Patient Consent Given: Yes Education handout provided: Previously provided Muscles treated: right upper trap and right rhomboid Electrical stimulation performed: No Parameters: N/A Treatment response/outcome: Utilized skilled palpation to identify bony landmarks and trigger points.  Able to illicit twitch response and muscle elongation.  Soft tissue mobilization following to further promote tissue elongation.     PATIENT EDUCATION:  Education details: Initiated HEP Person educated: Patient Education method: Programmer, multimedia, Facilities manager, Verbal cues, and Handouts Education comprehension: verbalized understanding, returned demonstration, and verbal cues required  HOME EXERCISE PROGRAM: Access Code: [email or web address] URL: [email or web address] Date:  09/03/2022 Prepared by: Mikey Kirschner  Exercises - Prone Shoulder Extension - Single Arm  - 2 x daily - 7 x weekly - 2 sets - 10 reps - Prone Shoulder Row  - 2 x daily - 7 x weekly - 2 sets - 10 reps - Prone Single Arm Shoulder Horizontal Abduction with Scapular Retraction and Palm Down  - 2 x daily - 7 x weekly - 2 sets - 10 reps - Sidelying Shoulder External Rotation  - 2 x daily - 7 x weekly - 2 sets - 10 reps - Single Arm Serratus Punches in Supine with Dumbbell  - 2 x daily - 7 x weekly - 2 sets - 10 reps - Cat Cow  - 1 x daily - 7 x weekly - 2 sets - 10 reps - Standing Shoulder Flexion to 90 Degrees with Dumbbells  - 2 x daily - 7 x weekly - 2 sets - 10 reps - Standing Shoulder Scaption  - 2 x daily - 7 x weekly - 2 sets - 10 reps - Wall Clock with Theraband  - 1 x daily - 7 x weekly - 2 sets - 5 reps - Neck Mobilization with Foam Roller  - 1 x daily - 7 x weekly - 2 sets - 10 reps - Sidelying Open Book Thoracic Rotation with Knee on Foam Roll  - 1 x daily - 7 x weekly - 1-2 sets - 10 reps - Supine Shoulder External Rotation on Foam Roll with Theraband  - 1 x daily - 7 x weekly - 2 sets -  10 reps - Hooklying Scapular Protraction on Foam Roll  - 1 x daily - 7 x weekly - 2 sets - 10 reps - Seated Assisted Cervical Rotation with Towel  - 1 x daily - 7 x weekly - 1 sets - 10 reps - 2-3 sec hold  ASSESSMENT:  CLINICAL IMPRESSION: Tammy Weeks is progressing appropriately.  Her pain is minimal and she reports 75% improvement in symptoms.  All objective findings are improved.  She continues to have some exacerbations on occasion and has trip planned just after her certification date.  We will go ahead and recert to be sure she does not have an exacerbation after all of her traveling.  Patient continues to require skilled PT to progress towards goal related activities.  OBJECTIVE IMPAIRMENTS: decreased ROM, decreased strength, increased fascial restrictions, increased muscle spasms, impaired  flexibility, postural dysfunction, and pain.   ACTIVITY LIMITATIONS: carrying, lifting, sleeping, transfers, bed mobility, dressing, reach over head, and hygiene/grooming  PARTICIPATION LIMITATIONS: meal prep, cleaning, laundry, driving, shopping, community activity, occupation, and church  PERSONAL FACTORS: Age and Time since onset of injury/illness/exacerbation are also affecting patient's functional outcome.   REHAB POTENTIAL: Good  CLINICAL DECISION MAKING: Stable/uncomplicated  EVALUATION COMPLEXITY: Low   GOALS: Goals reviewed with patient? Yes  SHORT TERM GOALS: Target date: 08/19/2022   Pain report to be no greater than 4/10  Baseline:  Goal status: MET on 7/22  2.  Patient will be independent with initial HEP  Baseline:  Goal status: MET on 7/22   LONG TERM GOALS: Target date: 09/16/2022   Patient to report pain no greater than 2/10  Baseline:  Goal status: ONGOING  2.  Patient to be independent with advanced HEP  Baseline:  Goal status: ONGOING  3.  Patient to report 85% improvement in overall symptoms Baseline: 75% reported on 09/11/22) Goal status: Ongoing (on 09/01/22 reports 50% improvement)  4.  Patient to be able to sleep through the night  Baseline:  Goal status: Ongoing  5.  FOTO to be 70 Baseline:  Goal status: INITIAL  6.  Patient will be able to reach overhead into cabinets and on top of shelves without right shoulder or neck pain  Baseline:  Goal status: INITIAL   PLAN:  PT FREQUENCY: 2x/week  PT DURATION: 8 weeks  PLANNED INTERVENTIONS: Therapeutic exercises, Therapeutic activity, Neuromuscular re-education, Balance training, Patient/Family education, Self Care, Joint mobilization, Vestibular training, DME instructions, Aquatic Therapy, Dry Needling, Electrical stimulation, Spinal mobilization, Cryotherapy, Moist heat, scar mobilization, Splintting, Taping, Vasopneumatic device, Traction, Ionotophoresis 4mg /ml Dexamethasone, Manual  therapy, and Re-evaluation  PLAN FOR NEXT SESSION: Recert and Progress HEP to include MELT, postural strengthening, strengthening, manual therapy/dry needling as needed   Vidalia Serpas B. Sylva Overley, PT 09/11/22 1:32 PM [facility] [address], [address] [place], [place] [address] Phone # [phone] Fax [phone]

## 2022-09-16 DIAGNOSIS — M8580 Other specified disorders of bone density and structure, unspecified site: Secondary | ICD-10-CM | POA: Diagnosis not present

## 2022-09-16 DIAGNOSIS — I10 Essential (primary) hypertension: Secondary | ICD-10-CM | POA: Diagnosis not present

## 2022-09-16 DIAGNOSIS — E782 Mixed hyperlipidemia: Secondary | ICD-10-CM | POA: Diagnosis not present

## 2022-09-16 DIAGNOSIS — E559 Vitamin D deficiency, unspecified: Secondary | ICD-10-CM | POA: Diagnosis not present

## 2022-09-16 DIAGNOSIS — F419 Anxiety disorder, unspecified: Secondary | ICD-10-CM | POA: Diagnosis not present

## 2022-09-19 ENCOUNTER — Other Ambulatory Visit (HOSPITAL_COMMUNITY): Payer: Self-pay | Admitting: Internal Medicine

## 2022-09-19 DIAGNOSIS — M5090 Cervical disc disorder, unspecified, unspecified cervical region: Secondary | ICD-10-CM

## 2022-10-06 ENCOUNTER — Ambulatory Visit (INDEPENDENT_AMBULATORY_CARE_PROVIDER_SITE_OTHER): Payer: Medicare Other | Admitting: Podiatry

## 2022-10-06 ENCOUNTER — Encounter: Payer: Self-pay | Admitting: Podiatry

## 2022-10-06 ENCOUNTER — Ambulatory Visit (INDEPENDENT_AMBULATORY_CARE_PROVIDER_SITE_OTHER): Payer: Medicare Other

## 2022-10-06 VITALS — BP 136/80 | HR 72 | Temp 96.9°F | Resp 18 | Ht 62.0 in | Wt 160.0 lb

## 2022-10-06 DIAGNOSIS — M778 Other enthesopathies, not elsewhere classified: Secondary | ICD-10-CM | POA: Diagnosis not present

## 2022-10-06 DIAGNOSIS — S92515A Nondisplaced fracture of proximal phalanx of left lesser toe(s), initial encounter for closed fracture: Secondary | ICD-10-CM

## 2022-10-06 NOTE — Progress Notes (Signed)
Subjective:   Patient ID: Tammy Weeks, female   DOB: 67 y.o.   MRN: [id number]   HPI Patient presents stating she traumatized her left fifth digit yesterday and it has been swollen and sore and she has had previous fracture metatarsal that she was concerned about.  States that she is having trouble wearing shoes   ROS      Objective:  Physical Exam  Neuro vascular status intact no change in health history edema swelling with pain of the left fifth digit with no proximal pathology noted currently     Assessment:  Inflammatory condition possibility for fracture of the proximal phalanx fifth digit left seen on emergency H&P     Plan:  X-rays reviewed and I have advised on ice therapy wider shoes gradual increase in activity which will probably take 8 to 12 weeks to heal completely  X-rays indicate suspicion for fracture of the head of the proximal phalanx fifth digit left but no indications of displacement or joint involvement

## 2022-10-07 ENCOUNTER — Ambulatory Visit: Payer: Medicare Other | Attending: Sports Medicine

## 2022-10-07 DIAGNOSIS — M545 Low back pain, unspecified: Secondary | ICD-10-CM | POA: Diagnosis present

## 2022-10-07 DIAGNOSIS — R293 Abnormal posture: Secondary | ICD-10-CM | POA: Diagnosis present

## 2022-10-07 DIAGNOSIS — M6281 Muscle weakness (generalized): Secondary | ICD-10-CM | POA: Diagnosis present

## 2022-10-07 DIAGNOSIS — R252 Cramp and spasm: Secondary | ICD-10-CM

## 2022-10-07 DIAGNOSIS — G8929 Other chronic pain: Secondary | ICD-10-CM | POA: Diagnosis present

## 2022-10-07 DIAGNOSIS — M542 Cervicalgia: Secondary | ICD-10-CM

## 2022-10-07 NOTE — Therapy (Signed)
OUTPATIENT PHYSICAL THERAPY TREATMENT NOTE   Patient Name: Tammy Weeks MRN: [id number] DOB:[birth year], 67 y.o., female Today's Date: 10/07/2022  END OF SESSION:  PT End of Session - 10/07/22 0839     Visit Number 9    Date for PT Re-Evaluation 11/04/22    Authorization Type MEDICARE PART A AND B    PT Start Time 0841    PT Stop Time 0930    PT Time Calculation (min) 49 min    Activity Tolerance Patient tolerated treatment well    Behavior During Therapy WFL for tasks assessed/performed              Past Medical History:  Diagnosis Date   Anxiety    GERD (gastroesophageal reflux disease)    High cholesterol    Hypertension    Past Surgical History:  Procedure Laterality Date   ABDOMINAL HYSTERECTOMY     ADRENAL GLAND SURGERY     CATARACT EXTRACTION     LAPAROSCOPIC APPENDECTOMY N/A 01/29/2022   Procedure: APPENDECTOMY LAPAROSCOPIC;  Surgeon: Axel Filler, MD;  Location: [facility];  Service: General;  Laterality: N/A;   TRIGGER FINGER RELEASE Left 03/05/2021   Procedure: RELEASE TRIGGER FINGER/A-1 PULLEY LEFT RING FINGER;  Surgeon: Cindee Salt, MD;  Location: [facility];  Service: Orthopedics;  Laterality: Left;  45 MIN   Patient Active Problem List   Diagnosis Date Noted   DDD (degenerative disc disease), cervical 07/18/2022   Lumbosacral spondylosis 05/21/2020   Coughing 01/09/2020   Herpes simplex 07/21/2019   Orbital wrinkles 08/19/2018   Primary osteoarthritis of both knees 07/13/2015   Low grade squamous intraepithelial lesion (LGSIL) on cervicovaginal cytologic smear 03/19/2011    PCP: Georgann Housekeeper, MD  REFERRING PROVIDER: Monica Becton, MD  REFERRING DIAG: M50.30 (ICD-10-CM) - DDD (degenerative disc disease), cervical  THERAPY DIAG:  Cervicalgia - Plan: PT plan of care cert/re-cert  Muscle weakness (generalized) - Plan: PT plan of care cert/re-cert  Cramp and spasm - Plan: PT plan of care cert/re-cert  Abnormal  posture - Plan: PT plan of care cert/re-cert  Rationale for Evaluation and Treatment: Rehabilitation  ONSET DATE: 07/18/2022  SUBJECTIVE:                                                                                                                                                                                                         SUBJECTIVE STATEMENT: Patient reports no major changes.  I still feel like I am doing pretty good.  "We are taking a trip next week and there will  be a lot of driving  Hand dominance: Right  PERTINENT HISTORY:  Evaluate and Treat. 1-2 times per week for 4-6 weeks. Decrease pain, increase strength, flexibility, function, and range of motion. Modalities may include, traction, ionto, phono, and stim. May include dry needling with or without stim.  PAIN:  PAIN:  Are you having pain? Yes NPRS scale: 1/10 Pain location: cervical and lats PAIN TYPE: aching Pain description: intermittent  Aggravating factors: stress, increased activity Relieving factors: massage   PRECAUTIONS: None  WEIGHT BEARING RESTRICTIONS: No  FALLS:  Has patient fallen in last 6 months? No  LIVING ENVIRONMENT: Lives with: lives with their spouse  OCCUPATION: Hx of sales and traveling  PLOF: Independent, Independent with basic ADLs, Independent with household mobility without device, Independent with community mobility without device, Independent with homemaking with ambulation, Independent with gait, Independent with transfers, Vocation/Vocational requirements: retired , and Leisure: walks, volunteers.   PATIENT GOALS: to eliminate neck and arm pain  NEXT MD VISIT: 09/05/22  OBJECTIVE:   DIAGNOSTIC FINDINGS:  Cervical Radiograph on 07/18/2022: IMPRESSION: 1. Stable lower cervical spondylosis.  No acute bony abnormality.  PATIENT SURVEYS:  Eval:  FOTO 66, goal is 70   09/11/22: FOTO 61, goal is 70   10/07/22: FOTO 65 , goal is 70  COGNITION: Overall cognitive  status: Within functional limits for tasks assessed  SENSATION: WFL  POSTURE:  decreased cervical lordosis  PALPATION: Right upper trap, levator scap and cervical paraspinals with trigger points and tight bands   CERVICAL ROM:   Active ROM A/PROM (deg) eval A/PROM (deg) eval A/PROM (Deg) 10/07/22  Flexion wnl WNL WNL  Extension wnl WNL WNL  Right lateral flexion 50% WNL WNL  Left lateral flexion 50% WNL WNL  Right rotation wnl WNL WNL  Left rotation wnl WNL WNL   (Blank rows = not tested)  UPPER EXTREMITY ROM:  WFL  UPPER EXTREMITY MMT: Eval: Generally 4 to 4+/5 grossly throughout UE  09/11/22: 5/5 bilateral Ue's without c/o pain  10/07/22:  5/5 bilateral UE no c/o pain  TODAY'S TREATMENT:                                                                                                                              DATE: 10/07/2022 Re-assessment completed for re-cert UBE x 3 min fwd only Lat pull down standing 2 x 10 with 40 lbs Matrix row 2 x 10 with 25 lbs 3 way scapular stabilization with blue loop (patient wanted to try more resistance) gave her yellow loop, she could not complete all reps and resumed the blue loop) x 10 each UE 4 D ball rolls x 20 each with light blue plyo ball Prone shoulder ext, rows and horizontal abduction x 20 each UE with 3# Side lying shoulder ER with 3# x 20 both Supine serratus punch x 20 with 3# both Trigger Point Dry-Needling  Treatment instructions: Expect mild to moderate muscle soreness. S/S of pneumothorax  if dry needled over a lung field, and to seek immediate medical attention should they occur. Patient verbalized understanding of these instructions and education. Patient Consent Given: Yes Education handout provided: Previously provided Muscles treated: bilateral upper traps Electrical stimulation performed: No Parameters: N/A Treatment response/outcome: Skilled palpation used to identify taut bands and trigger points.  Once  identified, dry needling techniques used to treat these areas.  Twitch response ellicited along with palpable elongation of muscle.  Following treatment, patient reported decreased pain and muscle tension in bilateral upper traps and in her neck.    DATE: 09/11/2022 UBE x 6 min 3/3 Re-assessment for recert 3 way scapular stabilization with blue loop (patient wanted to try more resistance) gave her yellow loop, she could not complete all reps and resumed the blue loop) x 10 each UE 4 D ball rolls x 20 each with light blue plyo ball Prone shoulder ext, rows and horizontal abduction x 20 each UE with 3# Side lying shoulder ER with 3# x 20 both Supine serratus punch x 20 with 3# both Trigger Point Dry-Needling  Treatment instructions: Expect mild to moderate muscle soreness. S/S of pneumothorax if dry needled over a lung field, and to seek immediate medical attention should they occur. Patient verbalized understanding of these instructions and education. Patient Consent Given: Yes Education handout provided: Previously provided Muscles treated: bilateral upper traps Electrical stimulation performed: No Parameters: N/A Treatment response/outcome: Skilled palpation used to identify taut bands and trigger points.  Once identified, dry needling techniques used to treat these areas.  Twitch response ellicited along with palpable elongation of muscle.  Following treatment, patient reported decreased pain and muscle tension in bilateral upper traps and in her neck.     DATE: 09/03/2022 UBE not available, did Nustep with PT present to discuss status x 5 min Cervical SNAGs 2x10 bilat Supine with cervical region on foam:  MELT method for rotation and flexion x20 each Sidelying Open Book with soft foam roll x10 bilat Hooklying on soft foam roll x1 min Hooklying on soft foam roll performing shoulder horizontal abduction with red tband 2x10 Hooklying on soft foam roll performing shoulder D2 extension 2x10  bilat  PATIENT EDUCATION:  Education details: Initiated HEP Person educated: Patient Education method: Programmer, multimedia, Facilities manager, Verbal cues, and Handouts Education comprehension: verbalized understanding, returned demonstration, and verbal cues required  HOME EXERCISE PROGRAM: Access Code: [email or web address] URL: [email or web address] Date: 09/03/2022 Prepared by: Mikey Kirschner  Exercises - Prone Shoulder Extension - Single Arm  - 2 x daily - 7 x weekly - 2 sets - 10 reps - Prone Shoulder Row  - 2 x daily - 7 x weekly - 2 sets - 10 reps - Prone Single Arm Shoulder Horizontal Abduction with Scapular Retraction and Palm Down  - 2 x daily - 7 x weekly - 2 sets - 10 reps - Sidelying Shoulder External Rotation  - 2 x daily - 7 x weekly - 2 sets - 10 reps - Single Arm Serratus Punches in Supine with Dumbbell  - 2 x daily - 7 x weekly - 2 sets - 10 reps - Cat Cow  - 1 x daily - 7 x weekly - 2 sets - 10 reps - Standing Shoulder Flexion to 90 Degrees with Dumbbells  - 2 x daily - 7 x weekly - 2 sets - 10 reps - Standing Shoulder Scaption  - 2 x daily - 7 x weekly - 2 sets - 10 reps - Wall Clock with Theraband  -  1 x daily - 7 x weekly - 2 sets - 5 reps - Neck Mobilization with Foam Roller  - 1 x daily - 7 x weekly - 2 sets - 10 reps - Sidelying Open Book Thoracic Rotation with Knee on Foam Roll  - 1 x daily - 7 x weekly - 1-2 sets - 10 reps - Supine Shoulder External Rotation on Foam Roll with Theraband  - 1 x daily - 7 x weekly - 2 sets - 10 reps - Hooklying Scapular Protraction on Foam Roll  - 1 x daily - 7 x weekly - 2 sets - 10 reps - Seated Assisted Cervical Rotation with Towel  - 1 x daily - 7 x weekly - 1 sets - 10 reps - 2-3 sec hold  ASSESSMENT:  CLINICAL IMPRESSION: Tammy Weeks has returned from a 4 week trip which required a lot of driving.  She had some mild return of pain and is concerned about what they will find on MRI and would like to hold off on discharge until after  MRI.  She did very well getting back into her exercises with no change in resistance.  She admits she wasn't able to do the shoulder series while she was out of town due to "just nowhere to do it"  We will go ahead and recert for 4 more weeks to see results of MRI and treat accordingly.  If patient has no unusual findings, we will continue for a few visits, then DC to HEP.   Patient continues to require skilled PT to progress towards goal related activities.  OBJECTIVE IMPAIRMENTS: decreased ROM, decreased strength, increased fascial restrictions, increased muscle spasms, impaired flexibility, postural dysfunction, and pain.   ACTIVITY LIMITATIONS: carrying, lifting, sleeping, transfers, bed mobility, dressing, reach over head, and hygiene/grooming  PARTICIPATION LIMITATIONS: meal prep, cleaning, laundry, driving, shopping, community activity, occupation, and church  PERSONAL FACTORS: Age and Time since onset of injury/illness/exacerbation are also affecting patient's functional outcome.   REHAB POTENTIAL: Good  CLINICAL DECISION MAKING: Stable/uncomplicated  EVALUATION COMPLEXITY: Low   GOALS: Goals reviewed with patient? Yes  SHORT TERM GOALS: Target date: 08/19/2022   Pain report to be no greater than 4/10  Baseline:  Goal status: MET on 7/22  2.  Patient will be independent with initial HEP  Baseline:  Goal status: MET on 7/22   LONG TERM GOALS: Target date: 09/16/2022   Patient to report pain no greater than 2/10  Baseline:  Goal status: ONGOING  2.  Patient to be independent with advanced HEP  Baseline:  Goal status: ONGOING  3.  Patient to report 85% improvement in overall symptoms Baseline: 80% reported on 10/07/22 Goal status: Ongoing   4.  Patient to be able to sleep through the night  Baseline:  Goal status: Ongoing  5.  FOTO to be 70 Baseline:  Goal status: INITIAL  6.  Patient will be able to reach overhead into cabinets and on top of shelves without  right shoulder or neck pain  Baseline:  Goal status: INITIAL   PLAN:  PT FREQUENCY: 2x/week  PT DURATION: 4 weeks  PLANNED INTERVENTIONS: Therapeutic exercises, Therapeutic activity, Neuromuscular re-education, Balance training, Patient/Family education, Self Care, Joint mobilization, Vestibular training, DME instructions, Aquatic Therapy, Dry Needling, Electrical stimulation, Spinal mobilization, Cryotherapy, Moist heat, scar mobilization, Splintting, Taping, Vasopneumatic device, Traction, Ionotophoresis 4mg /ml Dexamethasone, Manual therapy, and Re-evaluation  PLAN FOR NEXT SESSION: Recert.  Progress HEP to include MELT, postural strengthening, strengthening, manual therapy/dry needling as needed  Victorino Dike B. Tallulah Hosman, PT 10/07/22 9:32 AM [facility] [address] [place], [place] [address] Phone # [phone] Fax [phone]

## 2022-10-08 ENCOUNTER — Ambulatory Visit (HOSPITAL_COMMUNITY)
Admission: RE | Admit: 2022-10-08 | Discharge: 2022-10-08 | Disposition: A | Discharge status: Home / Self Care | Payer: Medicare Other | Source: Ambulatory Visit | Attending: Internal Medicine | Admitting: Internal Medicine

## 2022-10-08 DIAGNOSIS — M5090 Cervical disc disorder, unspecified, unspecified cervical region: Secondary | ICD-10-CM | POA: Insufficient documentation

## 2022-10-08 DIAGNOSIS — M4312 Spondylolisthesis, cervical region: Secondary | ICD-10-CM | POA: Diagnosis not present

## 2022-10-08 DIAGNOSIS — M50321 Other cervical disc degeneration at C4-C5 level: Secondary | ICD-10-CM | POA: Diagnosis not present

## 2022-10-08 DIAGNOSIS — M4802 Spinal stenosis, cervical region: Secondary | ICD-10-CM | POA: Diagnosis not present

## 2022-10-08 DIAGNOSIS — M50222 Other cervical disc displacement at C5-C6 level: Secondary | ICD-10-CM | POA: Diagnosis not present

## 2022-10-09 ENCOUNTER — Ambulatory Visit: Payer: Medicare Other

## 2022-10-09 DIAGNOSIS — M545 Low back pain, unspecified: Secondary | ICD-10-CM | POA: Diagnosis not present

## 2022-10-09 DIAGNOSIS — R252 Cramp and spasm: Secondary | ICD-10-CM

## 2022-10-09 DIAGNOSIS — R293 Abnormal posture: Secondary | ICD-10-CM

## 2022-10-09 DIAGNOSIS — G8929 Other chronic pain: Secondary | ICD-10-CM | POA: Diagnosis not present

## 2022-10-09 DIAGNOSIS — Z23 Encounter for immunization: Secondary | ICD-10-CM | POA: Diagnosis not present

## 2022-10-09 DIAGNOSIS — M542 Cervicalgia: Secondary | ICD-10-CM | POA: Diagnosis not present

## 2022-10-09 DIAGNOSIS — M6281 Muscle weakness (generalized): Secondary | ICD-10-CM

## 2022-10-14 ENCOUNTER — Ambulatory Visit: Payer: Medicare Other | Attending: Sports Medicine

## 2022-10-14 DIAGNOSIS — R293 Abnormal posture: Secondary | ICD-10-CM | POA: Diagnosis present

## 2022-10-14 DIAGNOSIS — M6281 Muscle weakness (generalized): Secondary | ICD-10-CM | POA: Diagnosis present

## 2022-10-14 DIAGNOSIS — M542 Cervicalgia: Secondary | ICD-10-CM | POA: Diagnosis not present

## 2022-10-14 DIAGNOSIS — R252 Cramp and spasm: Secondary | ICD-10-CM | POA: Insufficient documentation

## 2022-10-14 NOTE — Therapy (Signed)
OUTPATIENT PHYSICAL THERAPY TREATMENT NOTE  Patient Name: Tammy Weeks MRN: [id number] DOB:10-31-1955, 67 y.o., female Today's Date: 10/14/2022  END OF SESSION:  PT End of Session - 10/14/22 1400     Visit Number 11    Date for PT Re-Evaluation 11/04/22    Authorization Type MEDICARE PART A AND B    Progress Note Due on Visit 20    PT Start Time 1400    Activity Tolerance Patient tolerated treatment well    Behavior During Therapy WFL for tasks assessed/performed              Past Medical History:  Diagnosis Date   Anxiety    GERD (gastroesophageal reflux disease)    High cholesterol    Hypertension    Past Surgical History:  Procedure Laterality Date   ABDOMINAL HYSTERECTOMY     ADRENAL GLAND SURGERY     CATARACT EXTRACTION     LAPAROSCOPIC APPENDECTOMY N/A 01/29/2022   Procedure: APPENDECTOMY LAPAROSCOPIC;  Surgeon: Axel Filler, MD;  Location: [facility];  Service: General;  Laterality: N/A;   TRIGGER FINGER RELEASE Left 03/05/2021   Procedure: RELEASE TRIGGER FINGER/A-1 PULLEY LEFT RING FINGER;  Surgeon: Cindee Salt, MD;  Location: 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 smear 03/19/2011    PCP: Georgann Housekeeper, MD  REFERRING PROVIDER: Monica Becton, MD  REFERRING DIAG: M50.30 (ICD-10-CM) - DDD (degenerative disc disease), cervical  THERAPY DIAG:  Cervicalgia  Muscle weakness (generalized)  Cramp and spasm  Abnormal posture  Rationale for Evaluation and Treatment: Rehabilitation  ONSET DATE: 07/18/2022  SUBJECTIVE:                                                                                                                                                                                                          SUBJECTIVE STATEMENT: Patient reports she is doing fine.  She denies any pain.  "I had the Covid shot last week and became very ill"   Hand dominance: Right  PERTINENT HISTORY:  Evaluate and Treat. 1-2 times per week for 4-6 weeks. Decrease pain, increase strength, flexibility, function, and range of motion. Modalities may include, traction, ionto, phono, and stim. May include  dry needling with or without stim.  PAIN:  PAIN:  Are you having pain? Yes NPRS scale: 1/10 Pain location: cervical and lats PAIN TYPE: aching Pain description: intermittent  Aggravating factors: stress, increased activity Relieving factors: massage   PRECAUTIONS: None  WEIGHT BEARING RESTRICTIONS: No  FALLS:  Has patient fallen in last 6 months? No  LIVING ENVIRONMENT: Lives with: lives with their spouse  OCCUPATION: Hx of sales and traveling  PLOF: Independent, Independent with basic ADLs, Independent with household mobility without device, Independent with community mobility without device, Independent with homemaking with ambulation, Independent with gait, Independent with transfers, Vocation/Vocational requirements: retired , and Leisure: walks, volunteers.   PATIENT GOALS: to eliminate neck and arm pain  NEXT MD VISIT: 09/05/22  OBJECTIVE:   DIAGNOSTIC FINDINGS:  Cervical Radiograph on 07/18/2022: IMPRESSION: 1. Stable lower cervical spondylosis.  No acute bony abnormality.  PATIENT SURVEYS:  Eval:  FOTO 66, goal is 70   09/11/22: FOTO 61, goal is 70   10/07/22: FOTO 65 , goal is 70  COGNITION: Overall cognitive status: Within functional limits for tasks assessed  SENSATION: WFL  POSTURE:  decreased cervical lordosis  PALPATION: Right upper trap, levator scap and cervical paraspinals with trigger points and tight  bands   CERVICAL ROM:   Active ROM A/PROM (deg) eval A/PROM (deg) eval A/PROM (Deg) 10/07/22  Flexion wnl WNL WNL  Extension wnl WNL WNL  Right lateral flexion 50% WNL WNL  Left lateral flexion 50% WNL WNL  Right rotation wnl WNL WNL  Left rotation wnl WNL WNL   (Blank rows = not tested)  UPPER EXTREMITY ROM:  WFL  UPPER EXTREMITY MMT: Eval: Generally 4 to 4+/5 grossly throughout UE  09/11/22: 5/5 bilateral Ue's without c/o pain  10/07/22:  5/5 bilateral UE no c/o pain  TODAY'S TREATMENT:                                                                                                                              DATE: 10/14/2022 UBE x 5 min fwd only Lat pull down standing 2 x 10 with 40 lbs Matrix row 2 x 10 with 25 lbs 3 way scapular stabilization with red loop x 10 each UE 4 D ball rolls x 20 each with yellow plyo ball Standing shoulder flexion and scaption x 20 each with 2 lbs (verbal cues for correct technique- patient was doing deltoid raises) Reviewed HEP Lower trap lift off x 20 Declined dry needling today due to the reaction from the Covid vaccine.    DATE: 10/09/2022 UBE x 3 min fwd only Lat pull down standing 2 x 10 with 40 lbs Matrix row 2 x 10 with 25 lbs 3 way scapular stabilization with red loop (as requested) x 10 each UE 4 D ball rolls x 20 each with yellow plyo ball Standing shoulder flexion and scaption x 20 each with 2 lbs Prone shoulder ext and rows with 4 lbs x  20 and horizontal abduction x 20 with 3# Side lying shoulder ER with 3# x 20 both Supine serratus punch x 20 with 4# both Supine shoulder alphabet A-Z with 4 lbs  DATE: 10/07/2022 Re-assessment completed for re-cert UBE x 3 min fwd only Lat pull down standing 2 x 10 with 40 lbs Matrix row 2 x 10 with 25 lbs 3 way scapular stabilization with blue loop (patient wanted to try more resistance) gave her yellow loop, she could not complete all reps and resumed the blue loop) x 10 each UE 4 D  ball rolls x 20 each with light blue plyo ball Prone shoulder ext, rows and horizontal abduction x 20 each UE with 3# Side lying shoulder ER with 3# x 20 both Supine serratus punch x 20 with 3# both Trigger Point Dry-Needling  Treatment instructions: Expect mild to moderate muscle soreness. S/S of pneumothorax if dry needled over a lung field, and to seek immediate medical attention should they occur. Patient verbalized understanding of these instructions and education. Patient Consent Given: Yes Education handout provided: Previously provided Muscles treated: bilateral upper traps Electrical stimulation performed: No Parameters: N/A Treatment response/outcome: Skilled palpation used to identify taut bands and trigger points.  Once identified, dry needling techniques used to treat these areas.  Twitch response ellicited along with palpable elongation of muscle.  Following treatment, patient reported decreased pain and muscle tension in bilateral upper traps and in her neck.    PATIENT EDUCATION:  Education details: Initiated HEP Person educated: Patient Education method: Programmer, multimedia, Facilities manager, Verbal cues, and Handouts Education comprehension: verbalized understanding, returned demonstration, and verbal cues required  HOME EXERCISE PROGRAM: Access Code: [email or web address] URL: [email or web address] Date: 09/03/2022 Prepared by: Mikey Kirschner  Exercises - Prone Shoulder Extension - Single Arm  - 2 x daily - 7 x weekly - 2 sets - 10 reps - Prone Shoulder Row  - 2 x daily - 7 x weekly - 2 sets - 10 reps - Prone Single Arm Shoulder Horizontal Abduction with Scapular Retraction and Palm Down  - 2 x daily - 7 x weekly - 2 sets - 10 reps - Sidelying Shoulder External Rotation  - 2 x daily - 7 x weekly - 2 sets - 10 reps - Single Arm Serratus Punches in Supine with Dumbbell  - 2 x daily - 7 x weekly - 2 sets - 10 reps - Cat Cow  - 1 x daily - 7 x weekly - 2 sets - 10 reps -  Standing Shoulder Flexion to 90 Degrees with Dumbbells  - 2 x daily - 7 x weekly - 2 sets - 10 reps - Standing Shoulder Scaption  - 2 x daily - 7 x weekly - 2 sets - 10 reps - Wall Clock with Theraband  - 1 x daily - 7 x weekly - 2 sets - 5 reps - Neck Mobilization with Foam Roller  - 1 x daily - 7 x weekly - 2 sets - 10 reps - Sidelying Open Book Thoracic Rotation with Knee on Foam Roll  - 1 x daily - 7 x weekly - 1-2 sets - 10 reps - Supine Shoulder External Rotation on Foam Roll with Theraband  - 1 x daily - 7 x weekly - 2 sets - 10 reps - Hooklying Scapular Protraction on Foam Roll  - 1 x daily - 7 x weekly - 2 sets - 10 reps - Seated Assisted Cervical Rotation with Towel  - 1 x daily - 7 x  weekly - 1 sets - 10 reps - 2-3 sec hold  ASSESSMENT:  CLINICAL IMPRESSION: Estella arrives pain free today.  She was able to complete all tasks with ease.  She had a reaction to her covid vaccine last week and did not want to do dry needling as she was finally getting over the aching. She is compliant with her HEP and is well motivated.   Patient continues to require skilled PT to progress towards goal related activities.  OBJECTIVE IMPAIRMENTS: decreased ROM, decreased strength, increased fascial restrictions, increased muscle spasms, impaired flexibility, postural dysfunction, and pain.   ACTIVITY LIMITATIONS: carrying, lifting, sleeping, transfers, bed mobility, dressing, reach over head, and hygiene/grooming  PARTICIPATION LIMITATIONS: meal prep, cleaning, laundry, driving, shopping, community activity, occupation, and church  PERSONAL FACTORS: Age and Time since onset of injury/illness/exacerbation are also affecting patient's functional outcome.   REHAB POTENTIAL: Good  CLINICAL DECISION MAKING: Stable/uncomplicated  EVALUATION COMPLEXITY: Low   GOALS: Goals reviewed with patient? Yes  SHORT TERM GOALS: Target date: 08/19/2022   Pain report to be no greater than 4/10  Baseline:  Goal  status: MET on 7/22  2.  Patient will be independent with initial HEP  Baseline:  Goal status: MET on 7/22   LONG TERM GOALS: Target date: 09/16/2022   Patient to report pain no greater than 2/10  Baseline:  Goal status: MET 10/14/22  2.  Patient to be independent with advanced HEP  Baseline:  Goal status: MET 10/14/22  3.  Patient to report 85% improvement in overall symptoms Baseline: 80% reported on 10/07/22 Goal status: Ongoing   4.  Patient to be able to sleep through the night  Baseline:  Goal status: MET 10/14/22  5.  FOTO to be 70 Baseline:  Goal status: INITIAL  6.  Patient will be able to reach overhead into cabinets and on top of shelves without right shoulder or neck pain  Baseline:  Goal status: INITIAL   PLAN:  PT FREQUENCY: 2x/week  PT DURATION: 4 weeks  PLANNED INTERVENTIONS: Therapeutic exercises, Therapeutic activity, Neuromuscular re-education, Balance training, Patient/Family education, Self Care, Joint mobilization, Vestibular training, DME instructions, Aquatic Therapy, Dry Needling, Electrical stimulation, Spinal mobilization, Cryotherapy, Moist heat, scar mobilization, Splintting, Taping, Vasopneumatic device, Traction, Ionotophoresis 4mg /ml Dexamethasone, Manual therapy, and Re-evaluation  PLAN FOR NEXT SESSION: Postural strengthening, right rotator cuff strengthening, manual therapy/dry needling as needed.   Victorino Dike B. Jissel Slavens, PT 10/14/22 2:55 PM [facility] [address], [address] [place], [place] [address] Phone # [phone] Fax [phone]

## 2022-10-16 ENCOUNTER — Ambulatory Visit: Payer: Medicare Other

## 2022-10-16 DIAGNOSIS — R293 Abnormal posture: Secondary | ICD-10-CM

## 2022-10-16 DIAGNOSIS — M6281 Muscle weakness (generalized): Secondary | ICD-10-CM

## 2022-10-16 DIAGNOSIS — R252 Cramp and spasm: Secondary | ICD-10-CM | POA: Diagnosis not present

## 2022-10-16 DIAGNOSIS — M542 Cervicalgia: Secondary | ICD-10-CM | POA: Diagnosis not present

## 2022-10-16 NOTE — Therapy (Signed)
OUTPATIENT PHYSICAL THERAPY TREATMENT NOTE  Patient Name: Tammy Weeks MRN: [id number] DOB:Sep 29, 1955, 67 y.o., female Today's Date: 10/16/2022  END OF SESSION:  PT End of Session - 10/16/22 1500     Visit Number 12    Date for PT Re-Evaluation 11/04/22    Authorization Type MEDICARE PART A AND B    Progress Note Due on Visit 20    PT Start Time 1447    PT Stop Time 1530    PT Time Calculation (min) 43 min    Activity Tolerance Patient tolerated treatment well    Behavior During Therapy WFL for tasks assessed/performed              Past Medical History:  Diagnosis Date   Anxiety    GERD (gastroesophageal reflux disease)    High cholesterol    Hypertension    Past Surgical History:  Procedure Laterality Date   ABDOMINAL HYSTERECTOMY     ADRENAL GLAND SURGERY     CATARACT EXTRACTION     LAPAROSCOPIC APPENDECTOMY N/A 01/29/2022   Procedure: APPENDECTOMY LAPAROSCOPIC;  Surgeon: Axel Filler, MD;  Location: [facility];  Service: General;  Laterality: N/A;   TRIGGER FINGER RELEASE Left 03/05/2021   Procedure: RELEASE TRIGGER FINGER/A-1 PULLEY LEFT RING FINGER;  Surgeon: Cindee Salt, MD;  Location: [facility];  Service: Orthopedics;  Laterality: Left;  45 MIN   Patient Active Problem List   Diagnosis Date Noted   DDD (degenerative disc disease), cervical 07/18/2022   Lumbosacral spondylosis 05/21/2020   Coughing 01/09/2020   Herpes simplex 07/21/2019   Orbital wrinkles 08/19/2018   Primary osteoarthritis of both knees 07/13/2015   Low grade squamous intraepithelial lesion (LGSIL) on cervicovaginal cytologic smear 03/19/2011    PCP: Georgann Housekeeper, MD  REFERRING PROVIDER: Monica Becton, MD  REFERRING DIAG: M50.30 (ICD-10-CM) - DDD (degenerative disc disease), cervical  THERAPY DIAG:  Cervicalgia  Muscle weakness (generalized)  Cramp and spasm  Abnormal posture  Rationale for Evaluation and Treatment: Rehabilitation  ONSET DATE:  07/18/2022  SUBJECTIVE:                                                                                                                                                                                                         SUBJECTIVE STATEMENT: Patient reports she is doing fine.  She denies any pain.  "I had the Covid shot last week and became very ill"   Hand dominance: Right  PERTINENT HISTORY:  Evaluate and Treat. 1-2 times per week for 4-6 weeks. Decrease pain, increase strength,  flexibility, function, and range of motion. Modalities may include, traction, ionto, phono, and stim. May include dry needling with or without stim.  PAIN:  PAIN:  Are you having pain? Yes NPRS scale: 1/10 Pain location: cervical and lats PAIN TYPE: aching Pain description: intermittent  Aggravating factors: stress, increased activity Relieving factors: massage   PRECAUTIONS: None  WEIGHT BEARING RESTRICTIONS: No  FALLS:  Has patient fallen in last 6 months? No  LIVING ENVIRONMENT: Lives with: lives with their spouse  OCCUPATION: Hx of sales and traveling  PLOF: Independent, Independent with basic ADLs, Independent with household mobility without device, Independent with community mobility without device, Independent with homemaking with ambulation, Independent with gait, Independent with transfers, Vocation/Vocational requirements: retired , and Leisure: walks, volunteers.   PATIENT GOALS: to eliminate neck and arm pain  NEXT MD VISIT: 09/05/22  OBJECTIVE:   DIAGNOSTIC FINDINGS:  Cervical Radiograph on 07/18/2022: IMPRESSION: 1. Stable lower cervical spondylosis.  No acute bony abnormality.  PATIENT SURVEYS:  Eval:  FOTO 66, goal is 70   09/11/22: FOTO 61, goal is 70   10/07/22: FOTO 65 , goal is 70  COGNITION: Overall cognitive status: Within functional limits for tasks assessed  SENSATION: WFL  POSTURE:  decreased cervical lordosis  PALPATION: Right upper trap, levator  scap and cervical paraspinals with trigger points and tight bands   CERVICAL ROM:   Active ROM A/PROM (deg) eval A/PROM (deg) eval A/PROM (Deg) 10/07/22  Flexion wnl WNL WNL  Extension wnl WNL WNL  Right lateral flexion 50% WNL WNL  Left lateral flexion 50% WNL WNL  Right rotation wnl WNL WNL  Left rotation wnl WNL WNL   (Blank rows = not tested)  UPPER EXTREMITY ROM:  WFL  UPPER EXTREMITY MMT: Eval: Generally 4 to 4+/5 grossly throughout UE  09/11/22: 5/5 bilateral Ue's without c/o pain  10/07/22:  5/5 bilateral UE no c/o pain  TODAY'S TREATMENT:                                                                                                                              DATE: 10/16/2022 UBE x 5 min fwd only Lat pull down standing 2 x 10 with 40 lbs Matrix row 2 x 10 with 25 lbs 3 way scapular stabilization with red loop x 10 each UE 4 D ball rolls x 20 each with yellow plyo ball Standing shoulder flexion and scaption x 20 each with 2 lbs (verbal cues for correct technique- patient was doing deltoid raises) Prone drop n catch with light blue plyo ball x 20 each UE Lower trap lift off x 20 Supine on soft blue foam roller: bilateral shoulder ER, horizontal abduction, and D2 PNF 2 x 10 each UE of each exercise  DATE: 10/14/2022 UBE x 5 min fwd only Lat pull down standing 2 x 10 with 40 lbs Matrix row 2 x 10 with 25 lbs 3 way scapular stabilization with red loop x 10  each UE 4 D ball rolls x 20 each with yellow plyo ball Standing shoulder flexion and scaption x 20 each with 2 lbs (verbal cues for correct technique- patient was doing deltoid raises) Reviewed HEP Lower trap lift off x 20 Declined dry needling today due to the reaction from the Covid vaccine.    DATE: 10/09/2022 UBE x 3 min fwd only Lat pull down standing 2 x 10 with 40 lbs Matrix row 2 x 10 with 25 lbs 3 way scapular stabilization with red loop (as requested) x 10 each UE 4 D ball rolls x 20 each with yellow  plyo ball Standing shoulder flexion and scaption x 20 each with 2 lbs Prone shoulder ext and rows with 4 lbs x 20 and horizontal abduction x 20 with 3# Side lying shoulder ER with 3# x 20 both Supine serratus punch x 20 with 4# both Supine shoulder alphabet A-Z with 4 lbs PATIENT EDUCATION:  Education details: Initiated HEP Person educated: Patient Education method: Programmer, multimedia, Facilities manager, Verbal cues, and Handouts Education comprehension: verbalized understanding, returned demonstration, and verbal cues required  HOME EXERCISE PROGRAM: Access Code: [email or web address] URL: [email or web address] Date: 09/03/2022 Prepared by: Mikey Kirschner  Exercises - Prone Shoulder Extension - Single Arm  - 2 x daily - 7 x weekly - 2 sets - 10 reps - Prone Shoulder Row  - 2 x daily - 7 x weekly - 2 sets - 10 reps - Prone Single Arm Shoulder Horizontal Abduction with Scapular Retraction and Palm Down  - 2 x daily - 7 x weekly - 2 sets - 10 reps - Sidelying Shoulder External Rotation  - 2 x daily - 7 x weekly - 2 sets - 10 reps - Single Arm Serratus Punches in Supine with Dumbbell  - 2 x daily - 7 x weekly - 2 sets - 10 reps - Cat Cow  - 1 x daily - 7 x weekly - 2 sets - 10 reps - Standing Shoulder Flexion to 90 Degrees with Dumbbells  - 2 x daily - 7 x weekly - 2 sets - 10 reps - Standing Shoulder Scaption  - 2 x daily - 7 x weekly - 2 sets - 10 reps - Wall Clock with Theraband  - 1 x daily - 7 x weekly - 2 sets - 5 reps - Neck Mobilization with Foam Roller  - 1 x daily - 7 x weekly - 2 sets - 10 reps - Sidelying Open Book Thoracic Rotation with Knee on Foam Roll  - 1 x daily - 7 x weekly - 1-2 sets - 10 reps - Supine Shoulder External Rotation on Foam Roll with Theraband  - 1 x daily - 7 x weekly - 2 sets - 10 reps - Hooklying Scapular Protraction on Foam Roll  - 1 x daily - 7 x weekly - 2 sets - 10 reps - Seated Assisted Cervical Rotation with Towel  - 1 x daily - 7 x weekly - 1 sets - 10  reps - 2-3 sec hold  ASSESSMENT:  CLINICAL IMPRESSION: Nancey continues to do very well.  She still has some tingling in median nerve distribution but this is inconsistent.  She has not yet received MRI results and report still not entered in EPIC.  We will continue until MD reviews MRI results with patient.  She would benefit from continued skilled PT for postural and bilateral shoulder strengthening.    OBJECTIVE IMPAIRMENTS: decreased ROM, decreased strength, increased fascial restrictions, increased muscle spasms,  impaired flexibility, postural dysfunction, and pain.   ACTIVITY LIMITATIONS: carrying, lifting, sleeping, transfers, bed mobility, dressing, reach over head, and hygiene/grooming  PARTICIPATION LIMITATIONS: meal prep, cleaning, laundry, driving, shopping, community activity, occupation, and church  PERSONAL FACTORS: Age and Time since onset of injury/illness/exacerbation are also affecting patient's functional outcome.   REHAB POTENTIAL: Good  CLINICAL DECISION MAKING: Stable/uncomplicated  EVALUATION COMPLEXITY: Low   GOALS: Goals reviewed with patient? Yes  SHORT TERM GOALS: Target date: 08/19/2022   Pain report to be no greater than 4/10  Baseline:  Goal status: MET on 7/22  2.  Patient will be independent with initial HEP  Baseline:  Goal status: MET on 7/22   LONG TERM GOALS: Target date: 09/16/2022   Patient to report pain no greater than 2/10  Baseline:  Goal status: MET 10/14/22  2.  Patient to be independent with advanced HEP  Baseline:  Goal status: MET 10/14/22  3.  Patient to report 85% improvement in overall symptoms Baseline: 80% reported on 10/07/22 Goal status: Ongoing   4.  Patient to be able to sleep through the night  Baseline:  Goal status: MET 10/14/22  5.  FOTO to be 70 Baseline:  Goal status: INITIAL  6.  Patient will be able to reach overhead into cabinets and on top of shelves without right shoulder or neck pain  Baseline:   Goal status: INITIAL   PLAN:  PT FREQUENCY: 2x/week  PT DURATION: 4 weeks  PLANNED INTERVENTIONS: Therapeutic exercises, Therapeutic activity, Neuromuscular re-education, Balance training, Patient/Family education, Self Care, Joint mobilization, Vestibular training, DME instructions, Aquatic Therapy, Dry Needling, Electrical stimulation, Spinal mobilization, Cryotherapy, Moist heat, scar mobilization, Splintting, Taping, Vasopneumatic device, Traction, Ionotophoresis 4mg /ml Dexamethasone, Manual therapy, and Re-evaluation  PLAN FOR NEXT SESSION: Inquire about traction with patient, Postural strengthening, right rotator cuff strengthening, manual therapy/dry needling as needed.   Victorino Dike B. [name] Kops, PT 10/16/22 3:52 PM [facility] [address] [place], [place] [address] Phone # [phone] Fax [phone]

## 2022-10-23 ENCOUNTER — Ambulatory Visit: Payer: Medicare Other

## 2022-10-23 DIAGNOSIS — M6281 Muscle weakness (generalized): Secondary | ICD-10-CM | POA: Diagnosis not present

## 2022-10-23 DIAGNOSIS — R293 Abnormal posture: Secondary | ICD-10-CM | POA: Diagnosis not present

## 2022-10-23 DIAGNOSIS — M542 Cervicalgia: Secondary | ICD-10-CM

## 2022-10-23 DIAGNOSIS — R252 Cramp and spasm: Secondary | ICD-10-CM | POA: Diagnosis not present

## 2022-10-23 NOTE — Therapy (Signed)
OUTPATIENT PHYSICAL THERAPY TREATMENT NOTE  Patient Name: Tammy Weeks MRN: [id number] DOB:1955/02/21, 67 y.o., female Today's Date: 10/23/2022  END OF SESSION:  PT End of Session - 10/23/22 0804     Visit Number 13    Date for PT Re-Evaluation 11/04/22    Authorization Type MEDICARE PART A AND B    PT Start Time 0800    PT Stop Time 0832    PT Time Calculation (min) 32 min    Activity Tolerance Patient tolerated treatment well    Behavior During Therapy WFL for tasks assessed/performed              Past Medical History:  Diagnosis Date   Anxiety    GERD (gastroesophageal reflux disease)    High cholesterol    Hypertension    Past Surgical History:  Procedure Laterality Date   ABDOMINAL HYSTERECTOMY     ADRENAL GLAND SURGERY     CATARACT EXTRACTION     LAPAROSCOPIC APPENDECTOMY N/A 01/29/2022   Procedure: APPENDECTOMY LAPAROSCOPIC;  Surgeon: Axel Filler, MD;  Location: [facility];  Service: General;  Laterality: N/A;   TRIGGER FINGER RELEASE Left 03/05/2021   Procedure: RELEASE TRIGGER FINGER/A-1 PULLEY LEFT RING FINGER;  Surgeon: Cindee Salt, MD;  Location: [facility];  Service: Orthopedics;  Laterality: Left;  45 MIN   Patient Active Problem List   Diagnosis Date Noted   DDD (degenerative disc disease), cervical 07/18/2022   Lumbosacral spondylosis 05/21/2020   Coughing 01/09/2020   Herpes simplex 07/21/2019   Orbital wrinkles 08/19/2018   Primary osteoarthritis of both knees 07/13/2015   Low grade squamous intraepithelial lesion (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  She denies any pain.  "I had the Covid shot last week and became very ill"   Hand dominance: Right  PERTINENT HISTORY:  Evaluate and Treat. 1-2 times per week for 4-6 weeks. Decrease pain, increase strength, flexibility, function, and range of motion. Modalities may include, traction, ionto, phono,  and stim. May include dry needling with or without stim.  PAIN:  PAIN:  Are you having pain? Yes NPRS scale: 1/10 Pain location: cervical and lats PAIN TYPE: aching Pain description: intermittent  Aggravating factors: stress, increased activity Relieving factors: massage   PRECAUTIONS: None  WEIGHT BEARING RESTRICTIONS: No  FALLS:  Has patient fallen in last 6 months? No  LIVING ENVIRONMENT: Lives with: lives with their spouse  OCCUPATION: Hx of sales and traveling  PLOF: Independent, Independent with basic ADLs, Independent with household mobility without device, Independent with community mobility without device, Independent with homemaking with ambulation, Independent with gait, Independent with transfers, Vocation/Vocational requirements: retired , and Leisure: walks, volunteers.   PATIENT GOALS: to eliminate neck and arm pain  NEXT MD VISIT: 09/05/22  OBJECTIVE:   DIAGNOSTIC FINDINGS:  Cervical Radiograph on 07/18/2022: IMPRESSION: 1. Stable lower cervical spondylosis.  No acute bony abnormality.  PATIENT SURVEYS:  Eval:  FOTO 66, goal is 70   09/11/22: FOTO 61, goal is 70   10/07/22: FOTO 65 , goal is 70  COGNITION: Overall cognitive status: Within functional limits for tasks assessed  SENSATION: WFL  POSTURE:  decreased cervical lordosis  PALPATION: Right upper trap, levator scap and cervical paraspinals with trigger points and  tight bands   CERVICAL ROM:   Active ROM A/PROM (deg) eval A/PROM (deg) eval A/PROM (Deg) 10/07/22  Flexion wnl WNL WNL  Extension wnl WNL WNL  Right lateral flexion 50% WNL WNL  Left lateral flexion 50% WNL WNL  Right rotation wnl WNL WNL  Left rotation wnl WNL WNL   (Blank rows = not tested)  UPPER EXTREMITY ROM:  WFL  UPPER EXTREMITY MMT: Eval: Generally 4 to 4+/5 grossly throughout UE  09/11/22: 5/5 bilateral Ue's without c/o pain  10/07/22:  5/5 bilateral UE no c/o pain  TODAY'S TREATMENT:                                                                                                                              DATE: 10/23/2022 UBE x 5 min fwd only Lat pull down standing 2 x 10 with 40 lbs Matrix row 2 x 10 with 25 lbs 3 way scapular stabilization with red loop x 10 each UE 4 D ball rolls x 20 each with yellow plyo ball Standing shoulder flexion and scaption x 20 each with 2 lbs (verbal cues for correct technique- patient was doing deltoid raises) Prone drop n catch with light blue plyo ball x 20 each UE Trigger Point Dry-Needling  Treatment instructions: Expect mild to moderate muscle soreness. S/S of pneumothorax if dry needled over a lung field, and to seek immediate medical attention should they occur. Patient verbalized understanding of these instructions and education. Patient Consent Given: Yes Education handout provided: Yes Muscles treated: bilateral upper traps and right rhomboids Electrical stimulation performed: No Parameters: N/A Treatment response/outcome: Skilled palpation used to identify taut bands and trigger points.  Once identified,  dry needling techniques used to treat these areas.  Deep ache response ellicited along with palpable elongation of muscle.  Following treatment, patient reported mild soreness.     DATE: 10/16/2022 UBE x 5 min fwd only Lat pull down standing 2 x 10 with 40 lbs Matrix row 2 x 10 with 25 lbs 3 way scapular stabilization  with red loop x 10 each UE 4 D ball rolls x 20 each with yellow plyo ball Standing shoulder flexion and scaption x 20 each with 2 lbs (verbal cues for correct technique- patient was doing deltoid raises) Prone drop n catch with light blue plyo ball x 20 each UE Lower trap lift off x 20 Supine on soft blue foam roller: bilateral shoulder ER, horizontal abduction, and D2 PNF 2 x 10 each UE of each exercise  DATE: 10/14/2022 UBE x 5 min fwd only Lat pull down standing 2 x 10 with 40 lbs Matrix row 2 x 10 with 25 lbs 3 way scapular stabilization with red loop x 10 each UE 4 D ball rolls x 20 each with yellow plyo ball Standing shoulder flexion and scaption x 20 each with 2 lbs (verbal cues for correct technique- patient was doing deltoid raises) Reviewed HEP Lower trap lift off x 20 Declined dry needling today due to the reaction from the Covid vaccine.     PATIENT EDUCATION:  Education details: Initiated HEP Person educated: Patient Education method: Programmer, multimedia, Facilities manager, Verbal cues, and Handouts Education comprehension: verbalized understanding, returned demonstration, and verbal cues required  HOME EXERCISE PROGRAM: Access Code: [email or web address] URL: [email or web address] Date: 09/03/2022 Prepared by: Mikey Kirschner  Exercises - Prone Shoulder Extension - Single Arm  - 2 x daily - 7 x weekly - 2 sets - 10 reps - Prone Shoulder Row  - 2 x daily - 7 x weekly - 2 sets - 10 reps - Prone Single Arm Shoulder Horizontal Abduction with Scapular Retraction and Palm Down  - 2 x daily - 7 x weekly - 2 sets - 10 reps - Sidelying Shoulder External Rotation  - 2 x daily - 7 x weekly - 2 sets - 10 reps - Single Arm Serratus Punches in Supine with Dumbbell  - 2 x daily - 7 x weekly - 2 sets - 10 reps - Cat Cow  - 1 x daily - 7 x weekly - 2 sets - 10 reps - Standing Shoulder Flexion to 90 Degrees with Dumbbells  - 2 x daily - 7 x weekly - 2 sets - 10 reps - Standing Shoulder  Scaption  - 2 x daily - 7 x weekly - 2 sets - 10 reps - Wall Clock with Theraband  - 1 x daily - 7 x weekly - 2 sets - 5 reps - Neck Mobilization with Foam Roller  - 1 x daily - 7 x weekly - 2 sets - 10 reps - Sidelying Open Book Thoracic Rotation with Knee on Foam Roll  - 1 x daily - 7 x weekly - 1-2 sets - 10 reps - Supine Shoulder External Rotation on Foam Roll with Theraband  - 1 x daily - 7 x weekly - 2 sets - 10 reps - Hooklying Scapular Protraction on Foam Roll  - 1 x daily - 7 x weekly - 2 sets - 10 reps - Seated Assisted Cervical Rotation with Towel  - 1 x daily - 7 x weekly - 1 sets - 10 reps - 2-3 sec hold  ASSESSMENT:  CLINICAL IMPRESSION:  Izzabella is progressing appropriately and continues to tolerate PRE's with ease.  She was able to do drop and catch today with decreased loss of grip on the ball.    She has not yet received MRI results and report still not entered in EPIC.  We will continue until MD reviews MRI results with patient.  She would benefit from continued skilled PT for postural and bilateral shoulder strengthening.    OBJECTIVE IMPAIRMENTS: decreased ROM, decreased strength, increased fascial restrictions, increased muscle spasms, impaired flexibility, postural dysfunction, and pain.   ACTIVITY LIMITATIONS: carrying, lifting, sleeping, transfers, bed mobility, dressing, reach over head, and hygiene/grooming  PARTICIPATION LIMITATIONS: meal prep, cleaning, laundry, driving, shopping, community activity, occupation, and church  PERSONAL FACTORS: Age and Time since onset of injury/illness/exacerbation are also affecting patient's functional outcome.   REHAB POTENTIAL: Good  CLINICAL DECISION MAKING: Stable/uncomplicated  EVALUATION COMPLEXITY: Low   GOALS: Goals reviewed with patient? Yes  SHORT TERM GOALS: Target date: 08/19/2022   Pain report to be no greater than 4/10  Baseline:  Goal status: MET on 7/22  2.  Patient will be independent with initial HEP   Baseline:  Goal status: MET on 7/22   LONG TERM GOALS: Target date: 09/16/2022   Patient to report pain no greater than 2/10  Baseline:  Goal status: MET 10/14/22  2.  Patient to be independent with advanced HEP  Baseline:  Goal status: MET 10/14/22  3.  Patient to report 85% improvement in overall symptoms Baseline: 80% reported on 10/07/22 Goal status: Ongoing   4.  Patient to be able to sleep through the night  Baseline:  Goal status: MET 10/14/22  5.  FOTO to be 70 Baseline:  Goal status: INITIAL  6.  Patient will be able to reach overhead into cabinets and on top of shelves without right shoulder or neck pain  Baseline:  Goal status: INITIAL   PLAN:  PT FREQUENCY: 2x/week  PT DURATION: 4 weeks  PLANNED INTERVENTIONS: Therapeutic exercises, Therapeutic activity, Neuromuscular re-education, Balance training, Patient/Family education, Self Care, Joint mobilization, Vestibular training, DME instructions, Aquatic Therapy, Dry Needling, Electrical stimulation, Spinal mobilization, Cryotherapy, Moist heat, scar mobilization, Splintting, Taping, Vasopneumatic device, Traction, Ionotophoresis 4mg /ml Dexamethasone, Manual therapy, and Re-evaluation  PLAN FOR NEXT SESSION: Inquire about traction with patient, Postural strengthening, right rotator cuff strengthening, manual therapy/dry needling as needed.   Victorino Dike B. Alejandro Adcox, PT 10/23/22 8:45 AM [facility] [address], [address] [place], [place] [address] Phone # [phone] Fax [phone]

## 2022-10-28 ENCOUNTER — Ambulatory Visit: Payer: Medicare Other

## 2022-10-28 DIAGNOSIS — R252 Cramp and spasm: Secondary | ICD-10-CM

## 2022-10-28 DIAGNOSIS — M6281 Muscle weakness (generalized): Secondary | ICD-10-CM | POA: Diagnosis not present

## 2022-10-28 DIAGNOSIS — M542 Cervicalgia: Secondary | ICD-10-CM | POA: Diagnosis not present

## 2022-10-28 DIAGNOSIS — R293 Abnormal posture: Secondary | ICD-10-CM

## 2022-10-28 DIAGNOSIS — Z23 Encounter for immunization: Secondary | ICD-10-CM | POA: Diagnosis not present

## 2022-10-28 NOTE — Therapy (Signed)
OUTPATIENT PHYSICAL THERAPY TREATMENT NOTE  Patient Name: Tammy Weeks MRN: [id number] DOB:[birth year], 67 y.o., female Today's Date: 10/28/2022  END OF SESSION:  PT End of Session - 10/28/22 0805     Visit Number 14    Date for PT Re-Evaluation 11/04/22    Authorization Type MEDICARE PART A AND B    Progress Note Due on Visit 20    PT Start Time 0802    PT Stop Time 0827    PT Time Calculation (min) 25 min    Activity Tolerance Patient tolerated treatment well    Behavior During Therapy WFL for tasks assessed/performed              Past Medical History:  Diagnosis Date   Anxiety    GERD (gastroesophageal reflux disease)    High cholesterol    Hypertension    Past Surgical History:  Procedure Laterality Date   ABDOMINAL HYSTERECTOMY     ADRENAL GLAND SURGERY     CATARACT EXTRACTION     LAPAROSCOPIC APPENDECTOMY N/A 01/29/2022   Procedure: APPENDECTOMY LAPAROSCOPIC;  Surgeon: Axel Filler, MD;  Location: [facility];  Service: General;  Laterality: N/A;   TRIGGER FINGER RELEASE Left 03/05/2021   Procedure: RELEASE TRIGGER FINGER/A-1 PULLEY LEFT RING FINGER;  Surgeon: Cindee Salt, MD;  Location: [facility];  Service: Orthopedics;  Laterality: Left;  45 MIN   Patient Active Problem List   Diagnosis Date Noted   DDD (degenerative disc disease), cervical 07/18/2022   Lumbosacral spondylosis 05/21/2020   Coughing 01/09/2020   Herpes simplex 07/21/2019   Orbital wrinkles 08/19/2018   Primary osteoarthritis of both knees 07/13/2015   Low grade squamous intraepithelial lesion (LGSIL) on cervicovaginal cytologic smear 03/19/2011    PCP: Georgann Housekeeper, MD  REFERRING PROVIDER: Monica Becton, MD  REFERRING DIAG: M50.30 (ICD-10-CM) - DDD (degenerative disc disease), cervical  THERAPY DIAG:  Cervicalgia  Muscle weakness (generalized)  Cramp and spasm  Abnormal posture  Rationale for Evaluation and Treatment: Rehabilitation  ONSET  DATE: 07/18/2022  SUBJECTIVE:                                                                                                                                                                                                         SUBJECTIVE STATEMENT: Patient reports she is doing good. She states the tingling is less frequent.    Hand dominance: Right  PERTINENT HISTORY:  Evaluate and Treat. 1-2 times per week for 4-6 weeks. Decrease pain, increase strength, flexibility, function, and range of motion. Modalities may include,  traction, ionto, phono, and stim. May include dry needling with or without stim.  PAIN:  PAIN:  Are you having pain? Yes NPRS scale: 1/10 Pain location: cervical and lats PAIN TYPE: aching Pain description: intermittent  Aggravating factors: stress, increased activity Relieving factors: massage   PRECAUTIONS: None  WEIGHT BEARING RESTRICTIONS: No  FALLS:  Has patient fallen in last 6 months? No  LIVING ENVIRONMENT: Lives with: lives with their spouse  OCCUPATION: Hx of sales and traveling  PLOF: Independent, Independent with basic ADLs, Independent with household mobility without device, Independent with community mobility without device, Independent with homemaking with ambulation, Independent with gait, Independent with transfers, Vocation/Vocational requirements: retired , and Leisure: walks, volunteers.   PATIENT GOALS: to eliminate neck and arm pain  NEXT MD VISIT: 09/05/22  OBJECTIVE:   DIAGNOSTIC FINDINGS:  Cervical Radiograph on 07/18/2022: IMPRESSION: 1. Stable lower cervical spondylosis.  No acute bony abnormality.  PATIENT SURVEYS:  Eval:  FOTO 66, goal is 70   09/11/22: FOTO 61, goal is 70   10/07/22: FOTO 65 , goal is 70  COGNITION: Overall cognitive status: Within functional limits for tasks assessed  SENSATION: WFL  POSTURE:  decreased cervical lordosis  PALPATION: Right upper trap, levator scap and cervical paraspinals  with trigger points and tight bands   CERVICAL ROM:   Active ROM A/PROM (deg) eval A/PROM (deg) eval A/PROM (Deg) 10/07/22  Flexion wnl WNL WNL  Extension wnl WNL WNL  Right lateral flexion 50% WNL WNL  Left lateral flexion 50% WNL WNL  Right rotation wnl WNL WNL  Left rotation wnl WNL WNL   (Blank rows = not tested)  UPPER EXTREMITY ROM:  WFL  UPPER EXTREMITY MMT: Eval: Generally 4 to 4+/5 grossly throughout UE  09/11/22: 5/5 bilateral Ue's without c/o pain  10/07/22:  5/5 bilateral UE no c/o pain  TODAY'S TREATMENT:                                                                                                                              DATE: 10/28/2022 UBE x 6 min fwd/back 3/3 Lat pull down standing 2 x 10 with 40 lbs Matrix row 2 x 10 with 30 lbs 3 way scapular stabilization with red loop x 10 each UE 4 D ball rolls x 20 each with yellow plyo ball Standing shoulder flexion and scaption x 20 each with 2 lbs (verbal cues for correct technique- patient was doing deltoid raises) Prone drop n catch with light blue plyo ball x 20 each UE Trigger Point Dry-Needling  Treatment instructions: Expect mild to moderate muscle soreness. S/S of pneumothorax if dry needled over a lung field, and to seek immediate medical attention should they occur. Patient verbalized understanding of these instructions and education. Patient Consent Given: Yes Education handout provided: Yes Muscles treated: bilateral upper traps and levator scapula Electrical stimulation performed: No Parameters: N/A Treatment response/outcome: Skilled palpation used to identify taut bands and trigger points.  Once identified, dry needling techniques used to treat these areas.  Deep ache bilaterally and twitch response on right ellicited along with palpable elongation of muscle.  Following treatment, patient reported mild soreness.     DATE: 10/23/2022 UBE x 5 min fwd only Lat pull down standing 2 x 10 with 40  lbs Matrix row 2 x 10 with 25 lbs 3 way scapular stabilization with red loop x 10 each UE 4 D ball rolls x 20 each with yellow plyo ball Standing shoulder flexion and scaption x 20 each with 2 lbs (verbal cues for correct technique- patient was doing deltoid raises) Prone drop n catch with light blue plyo ball x 20 each UE Trigger Point Dry-Needling  Treatment instructions: Expect mild to moderate muscle soreness. S/S of pneumothorax if dry needled over a lung field, and to seek immediate medical attention should they occur. Patient verbalized understanding of these instructions and education. Patient Consent Given: Yes Education handout provided: Yes Muscles treated: bilateral upper traps and right rhomboids Electrical stimulation performed: No Parameters: N/A Treatment response/outcome: Skilled palpation used to identify taut bands and trigger points.  Once identified, dry needling techniques used to treat these areas.  Deep ache response ellicited along with palpable elongation of muscle.  Following treatment, patient reported mild soreness.     DATE: 10/16/2022 UBE x 5 min fwd only Lat pull down standing 2 x 10 with 40 lbs Matrix row 2 x 10 with 25 lbs 3 way scapular stabilization with red loop x 10 each UE 4 D ball rolls x 20 each with yellow plyo ball Standing shoulder flexion and scaption x 20 each with 2 lbs (verbal cues for correct technique- patient was doing deltoid raises) Prone drop n catch with light blue plyo ball x 20 each UE Lower trap lift off x 20 Supine on soft blue foam roller: bilateral shoulder ER, horizontal abduction, and D2 PNF 2 x 10 each UE of each exercise   PATIENT EDUCATION:  Education details: Initiated HEP Person educated: Patient Education method: Programmer, multimedia, Facilities manager, Verbal cues, and Handouts Education comprehension: verbalized understanding, returned demonstration, and verbal cues required  HOME EXERCISE PROGRAM: Access Code: [email or web address] URL:  [email or web address] Date: 09/03/2022 Prepared by: Mikey Kirschner  Exercises - Prone Shoulder Extension - Single Arm  - 2 x daily - 7 x weekly - 2 sets - 10 reps - Prone Shoulder Row  - 2 x daily - 7 x weekly - 2 sets - 10 reps - Prone Single Arm Shoulder Horizontal Abduction with Scapular Retraction and Palm Down  - 2 x daily - 7 x weekly - 2 sets - 10 reps - Sidelying Shoulder External Rotation  - 2 x daily - 7 x weekly - 2 sets - 10 reps - Single Arm Serratus Punches in Supine with Dumbbell  - 2 x daily - 7 x weekly - 2 sets - 10 reps - Cat Cow  - 1 x daily - 7 x weekly - 2 sets - 10 reps - Standing Shoulder Flexion to 90 Degrees with Dumbbells  - 2 x daily - 7 x weekly - 2 sets - 10 reps - Standing Shoulder Scaption  - 2 x daily - 7 x weekly - 2 sets - 10 reps - Wall Clock with Theraband  - 1 x daily - 7 x weekly - 2 sets - 5 reps - Neck Mobilization with Foam Roller  - 1 x daily - 7 x weekly - 2 sets - 10 reps -  Sidelying Open Book Thoracic Rotation with Knee on Foam Roll  - 1 x daily - 7 x weekly - 1-2 sets - 10 reps - Supine Shoulder External Rotation on Foam Roll with Theraband  - 1 x daily - 7 x weekly - 2 sets - 10 reps - Hooklying Scapular Protraction on Foam Roll  - 1 x daily - 7 x weekly - 2 sets - 10 reps - Seated Assisted Cervical Rotation with Towel  - 1 x daily - 7 x weekly - 1 sets - 10 reps - 2-3 sec hold  ASSESSMENT:  CLINICAL IMPRESSION: Ameila is making steady progress and is reporting decreased radicular symptoms.    She has still not received MRI results and report still not entered in EPIC.  We will continue until MD reviews MRI results with patient.  She would benefit from continued skilled PT for postural and bilateral shoulder strengthening.    OBJECTIVE IMPAIRMENTS: decreased ROM, decreased strength, increased fascial restrictions, increased muscle spasms, impaired flexibility, postural dysfunction, and pain.   ACTIVITY LIMITATIONS: carrying,  lifting, sleeping, transfers, bed mobility, dressing, reach over head, and hygiene/grooming  PARTICIPATION LIMITATIONS: meal prep, cleaning, laundry, driving, shopping, community activity, occupation, and church  PERSONAL FACTORS: Age and Time since onset of injury/illness/exacerbation are also affecting patient's functional outcome.   REHAB POTENTIAL: Good  CLINICAL DECISION MAKING: Stable/uncomplicated  EVALUATION COMPLEXITY: Low   GOALS: Goals reviewed with patient? Yes  SHORT TERM GOALS: Target date: 08/19/2022   Pain report to be no greater than 4/10  Baseline:  Goal status: MET on 7/22  2.  Patient will be independent with initial HEP  Baseline:  Goal status: MET on 7/22   LONG TERM GOALS: Target date: 09/16/2022   Patient to report pain no greater than 2/10  Baseline:  Goal status: MET 10/14/22  2.  Patient to be independent with advanced HEP  Baseline:  Goal status: MET 10/14/22  3.  Patient to report 85% improvement in overall symptoms Baseline: 80% reported on 10/07/22 Goal status: Ongoing   4.  Patient to be able to sleep through the night  Baseline:  Goal status: MET 10/14/22  5.  FOTO to be 70 Baseline:  Goal status: In progress 10/28/22  6.  Patient will be able to reach overhead into cabinets and on top of shelves without right shoulder or neck pain  Baseline:  Goal status: In progress 10/28/22   PLAN:  PT FREQUENCY: 2x/week  PT DURATION: 4 weeks  PLANNED INTERVENTIONS: Therapeutic exercises, Therapeutic activity, Neuromuscular re-education, Balance training, Patient/Family education, Self Care, Joint mobilization, Vestibular training, DME instructions, Aquatic Therapy, Dry Needling, Electrical stimulation, Spinal mobilization, Cryotherapy, Moist heat, scar mobilization, Splintting, Taping, Vasopneumatic device, Traction, Ionotophoresis 4mg /ml Dexamethasone, Manual therapy, and Re-evaluation  PLAN FOR NEXT SESSION: Possible traction if radicular  symptoms re-surface, Postural strengthening, right rotator cuff strengthening, manual therapy/dry needling as needed.   Victorino Dike B. Kenedy Haisley, PT 10/28/22 8:31 AM [facility] [address], [address] [place], [place] [address] Phone # [phone] Fax [phone]

## 2022-10-31 DIAGNOSIS — L02224 Furuncle of groin: Secondary | ICD-10-CM | POA: Diagnosis not present

## 2022-11-04 ENCOUNTER — Ambulatory Visit: Payer: Medicare Other

## 2022-11-04 DIAGNOSIS — R293 Abnormal posture: Secondary | ICD-10-CM | POA: Diagnosis not present

## 2022-11-04 DIAGNOSIS — M6281 Muscle weakness (generalized): Secondary | ICD-10-CM

## 2022-11-04 DIAGNOSIS — M542 Cervicalgia: Secondary | ICD-10-CM | POA: Diagnosis not present

## 2022-11-04 DIAGNOSIS — R252 Cramp and spasm: Secondary | ICD-10-CM | POA: Diagnosis not present

## 2022-11-04 IMAGING — CT CT CHEST W/ CM
1 series · 14 of 34 positions shown, 18 images · IV contrast (APPLIED)
Comparison: Abdominal/pelvic CT scan 04/16/2012

CLINICAL DATA: Follow-up pulmonary nodule seen in the left lower
lobe on a prior abdominal CT scan.

EXAM:
CT CHEST WITH CONTRAST
TECHNIQUE: Multidetector CT imaging of the chest was performed during
intravenous contrast administration.
CONTRAST:  75mL 1OXO3S-6HH IOPAMIDOL (1OXO3S-6HH) INJECTION 61%

[Series 2: chest w/cm · axial · 0.84mm/px · z∈[-266,-26]mm · 14 of 142 slices shown, 18 images]
[im 11/142  mediastinal]
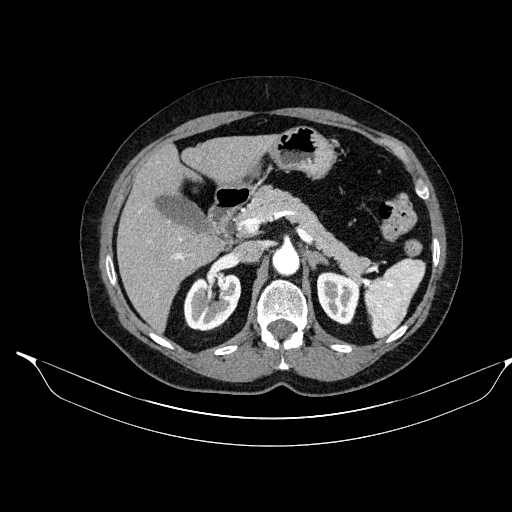
[im 11/142  lung]
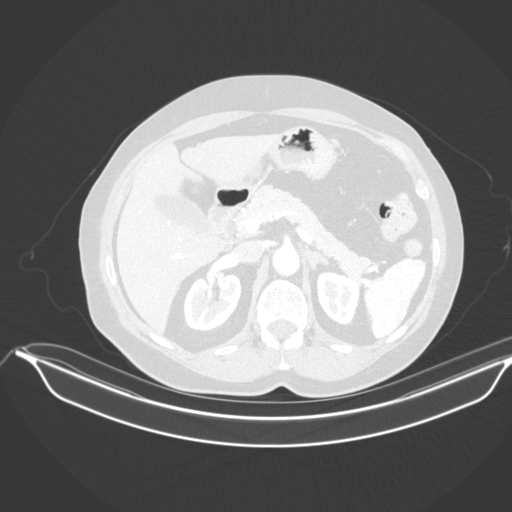
[im 21/142  lung]
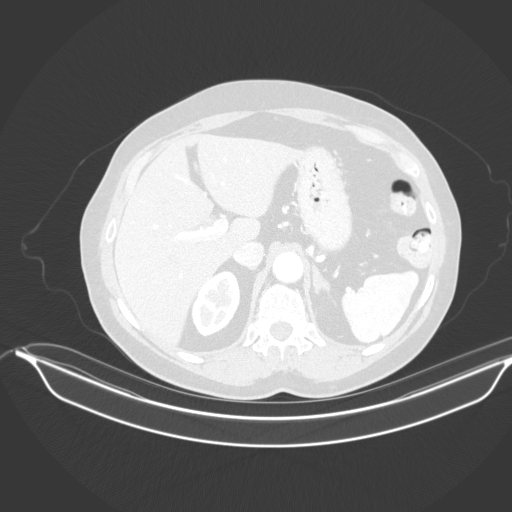
[im 29/142  lung]
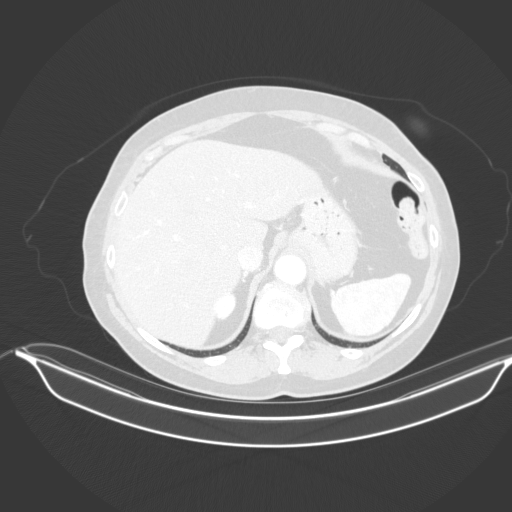
[im 42/142  lung]
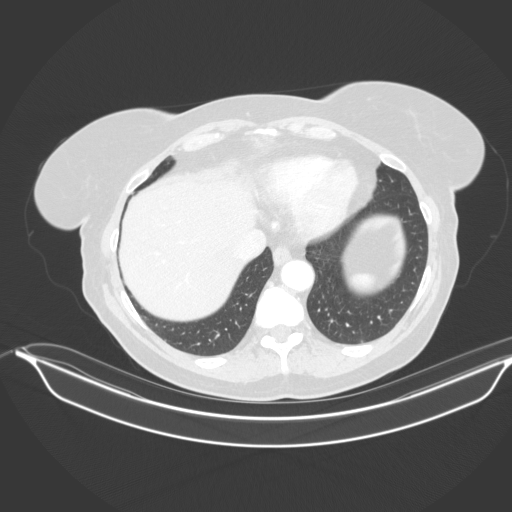
[im 53/142  mediastinal]
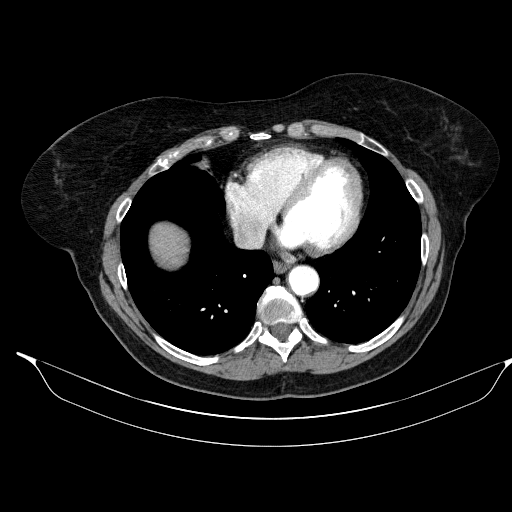
[im 53/142  lung]
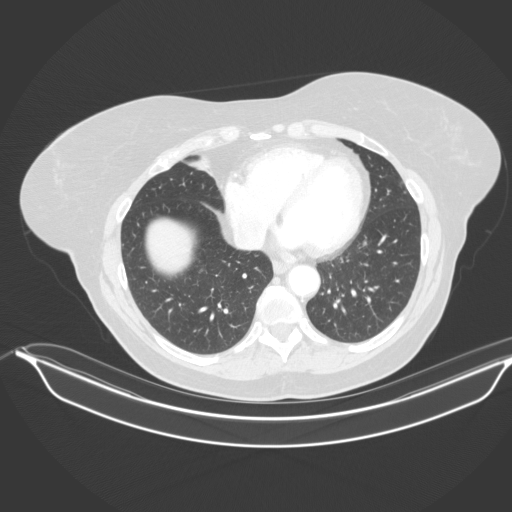
[im 58/142  lung]
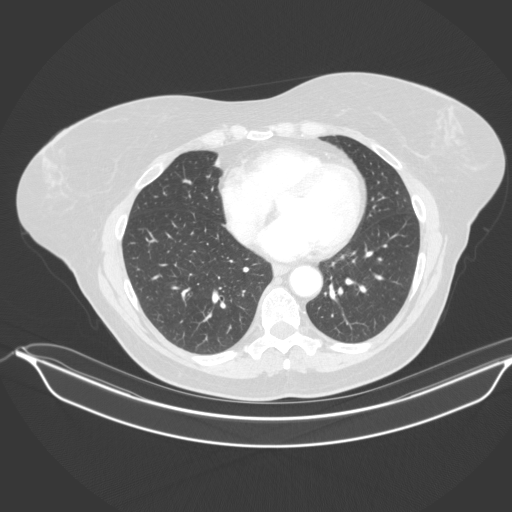
[im 67/142  lung]
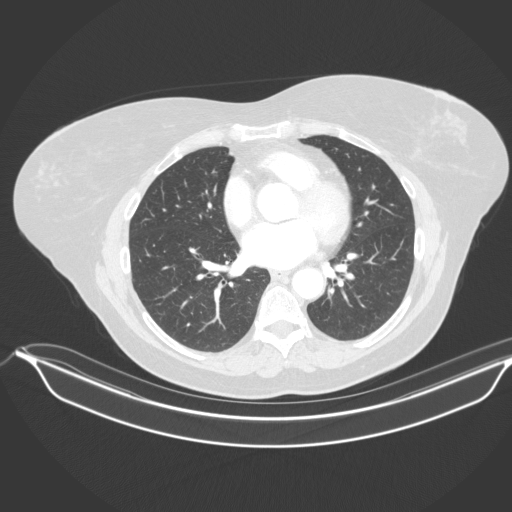
[im 76/142  lung]
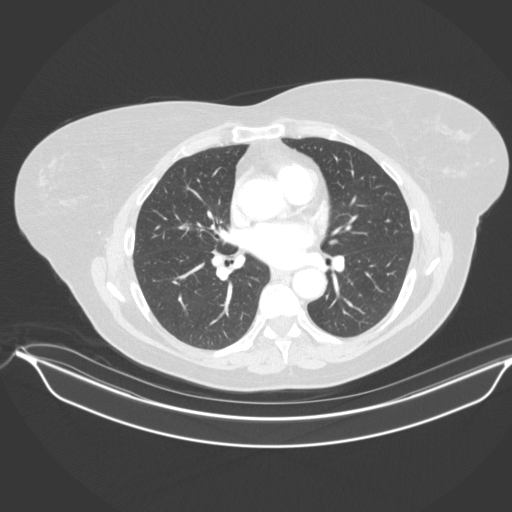
[im 84/142  mediastinal]
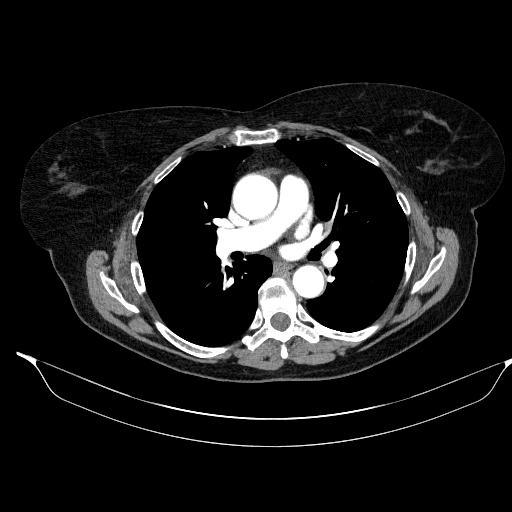
[im 84/142  lung]
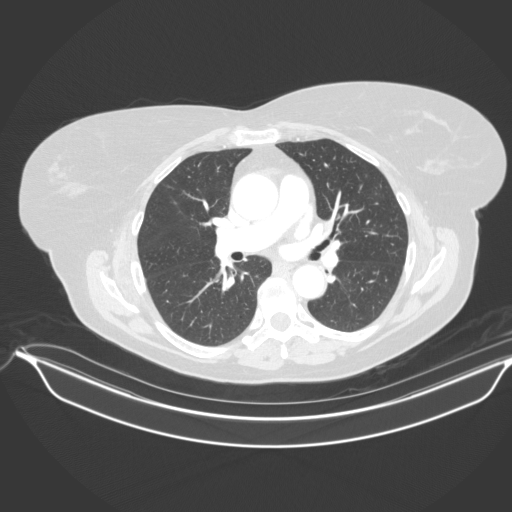
[im 89/142  lung]
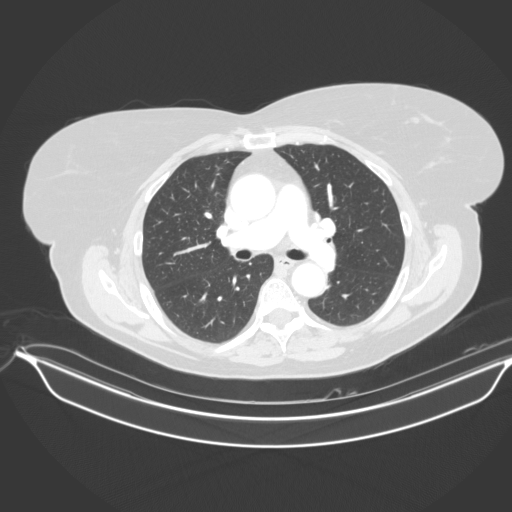
[im 105/142  lung]
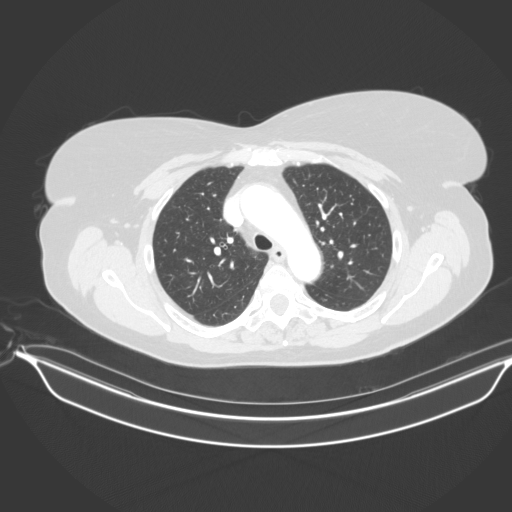
[im 113/142  lung]
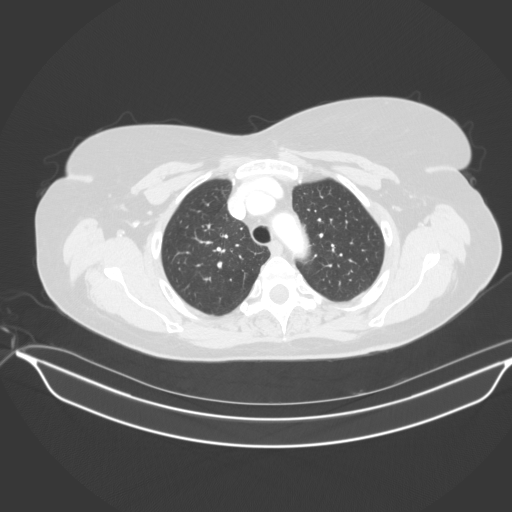
[im 121/142  mediastinal]
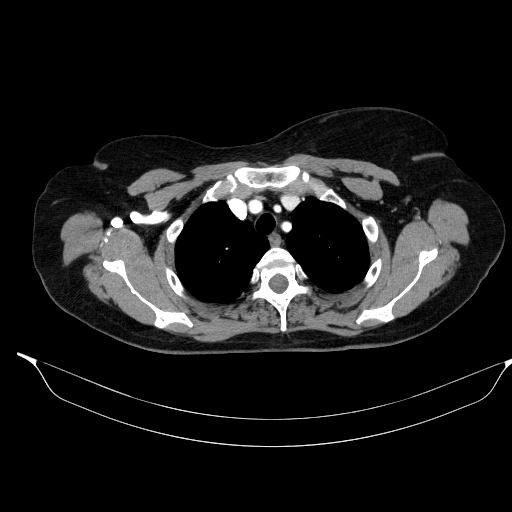
[im 121/142  lung]
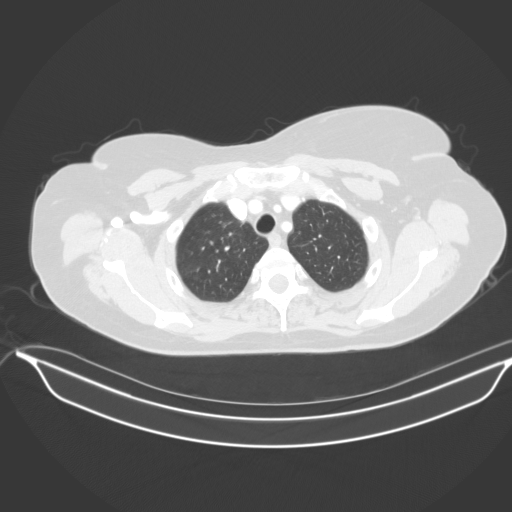
[im 131/142  lung]
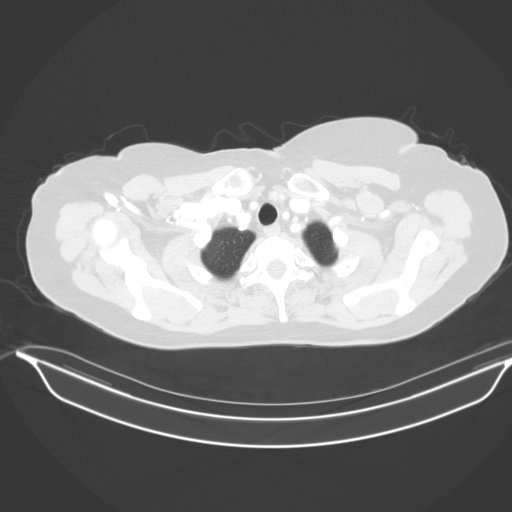

[14 of 34 positions shown; findings below may reference images not displayed]

FINDINGS: Cardiovascular: The heart is normal in size. No pericardial
effusion. The aorta is normal in caliber. No dissection. Minimal
atherosclerotic calcification near the aortic arch. The aortic
branch vessels are patent. Mild calcification near the origin of the
left subclavian artery. Minimal scattered coronary artery
calcifications.

The pulmonary arteries are unremarkable.

Mediastinum/Nodes: No mediastinal or hilar mass or lymphadenopathy.
The esophagus is unremarkable.

Lungs/Pleura: No acute pulmonary findings. No infiltrates, edema or
effusions. No interstitial lung disease or bronchiectasis.

3 mm left lower lobe pulmonary nodule on image number 82/5 is
unchanged since 7558. There is also a nearby 2 mm subpleural nodule
on image number 88/5. This is likely a benign subpleural lymph node
and is also unchanged.

4 mm nodule along the major fissure in the right lower lobe on image
57/5 is most consistent with a benign subpleural lymph node.

Small calcified granuloma noted in the right lower lobe anteriorly
on image 93/5.

No worrisome pulmonary lesions. No pleural effusion or pleural
lesions. Streaky scarring changes noted in the medial aspect of the
right middle lobe, unchanged.

Upper Abdomen: No significant upper abdominal findings. Suspect
prior right adrenalectomy. The left adrenal gland is normal. Minimal
atherosclerotic calcification noted near the right renal artery
ostia. The visualized pancreas is unremarkable. No hepatic lesions.

Musculoskeletal: No breast masses, supraclavicular or axillary
adenopathy. Thyroid gland is unremarkable.

The bony thorax is intact.  No bone lesions or fractures.
IMPRESSION: 1. Stable small left lower lobe pulmonary nodules since 7558,
considered benign.
2. 4 mm subpleural nodule in the right lower lobe adjacent to the
major fissure is most consistent with a benign subpleural lymph
node. No follow-up needed if patient is low-risk. Non-contrast chest
CT can be considered in 12 months if patient is high-risk. This
recommendation follows the consensus statement: Guidelines for
Management of Incidental Pulmonary Nodules Detected on CT Images:
3. No acute pulmonary findings or worrisome pulmonary lesions.
4. No mediastinal or hilar mass or adenopathy.
5. Minimal aortic and coronary artery atherosclerosis.

Aortic Atherosclerosis (OCAKS-G2Z.Z).

## 2022-11-04 NOTE — Therapy (Signed)
OUTPATIENT PHYSICAL THERAPY RECERT NOTE  Patient Name: Tammy Weeks MRN: [id number] DOB:05-May-1955, 67 y.o., female Today's Date: 11/04/2022  END OF SESSION:  PT End of Session - 11/04/22 0842     Visit Number 15    Date for PT Re-Evaluation 11/04/22    Authorization Type MEDICARE PART A AND B    Progress Note Due on Visit 20    PT Start Time [id number]    PT Stop Time 0933    PT Time Calculation (min) 51 min    Activity Tolerance Patient tolerated treatment well    Behavior During Therapy WFL for tasks assessed/performed              Past Medical History:  Diagnosis Date   Anxiety    GERD (gastroesophageal reflux disease)    High cholesterol    Hypertension    Past Surgical History:  Procedure Laterality Date   ABDOMINAL HYSTERECTOMY     ADRENAL GLAND SURGERY     CATARACT EXTRACTION     LAPAROSCOPIC APPENDECTOMY N/A 01/29/2022   Procedure: APPENDECTOMY LAPAROSCOPIC;  Surgeon: Axel Filler, MD;  Location: [facility];  Service: General;  Laterality: N/A;   TRIGGER FINGER RELEASE Left 03/05/2021   Procedure: RELEASE TRIGGER FINGER/A-1 PULLEY LEFT RING FINGER;  Surgeon: Cindee Salt, MD;  Location: [facility];  Service: Orthopedics;  Laterality: Left;  45 MIN   Patient Active Problem List   Diagnosis Date Noted   DDD (degenerative disc disease), cervical 07/18/2022   Lumbosacral spondylosis 05/21/2020   Coughing 01/09/2020   Herpes simplex 07/21/2019   Orbital wrinkles 08/19/2018   Primary osteoarthritis of both knees 07/13/2015   Low grade squamous intraepithelial lesion (LGSIL) on cervicovaginal cytologic smear 03/19/2011    PCP: Georgann Housekeeper, MD  REFERRING PROVIDER: Monica Becton, MD  REFERRING DIAG: M50.30 (ICD-10-CM) - DDD (degenerative disc disease), cervical  THERAPY DIAG:  Cervicalgia - Plan: PT plan of care cert/re-cert  Muscle weakness (generalized) - Plan: PT plan of care cert/re-cert  Cramp and spasm - Plan: PT plan of  care cert/re-cert  Abnormal posture - Plan: PT plan of care cert/re-cert  Rationale for Evaluation and Treatment: Rehabilitation  ONSET DATE: 07/18/2022  SUBJECTIVE:                                                                                                                                                                                                         SUBJECTIVE STATEMENT: Patient reports she received the MRI results and Dr. Danielle Dess would like to meet with her.  We discussed traction and she would like to try this.  Her cert date runs out today but we discussed extending at least until she meets with Dr. Danielle Dess.  Pain level 2/10  Hand dominance: Right  PERTINENT HISTORY:  Evaluate and Treat. 1-2 times per week for 4-6 weeks. Decrease pain, increase strength, flexibility, function, and range of motion. Modalities may include, traction, ionto, phono, and stim. May include dry needling with or without stim.  PAIN:  PAIN:  Are you having pain? Yes NPRS scale: 2/10 (over the weekend, I got up to around 4/10) Pain location: cervical and lats PAIN TYPE: aching Pain description: intermittent  Aggravating factors: stress, increased activity Relieving factors: massage   PRECAUTIONS: None  WEIGHT BEARING RESTRICTIONS: No  FALLS:  Has patient fallen in last 6 months? No  LIVING ENVIRONMENT: Lives with: lives with their spouse  OCCUPATION: Hx of sales and traveling  PLOF: Independent, Independent with basic ADLs, Independent with household mobility without device, Independent with community mobility without device, Independent with homemaking with ambulation, Independent with gait, Independent with transfers, Vocation/Vocational requirements: retired , and Leisure: walks, volunteers.   PATIENT GOALS: to eliminate neck and arm pain  NEXT MD VISIT: 09/05/22  OBJECTIVE:   DIAGNOSTIC FINDINGS:  Cervical Radiograph on 07/18/2022: IMPRESSION: 1. Stable lower cervical  spondylosis.  No acute bony abnormality.  11/04/22: most recent MRI IMPRESSION: 1. Straightening of cervical lordosis with subtle degenerative anterolisthesis of C3 on C4. No acute osseous abnormality. 2. Moderate C4-C5 disc degeneration with a broad-based posterior component, mild mass effect on the ventral right spinal cord, but no spinal stenosis or cord signal abnormality. Associated moderate to severe right neural foraminal stenosis, query right C5 radiculitis. 3. Mild to moderate left C6 neural foraminal stenosis. Up to mild right C4 and C6 neural foraminal stenosis.  PATIENT SURVEYS:  Eval:  FOTO 66, goal is 70   09/11/22: FOTO 61, goal is 70   10/07/22: FOTO 65 , goal is 70   11/04/22: FOTO 69, goal is 70  COGNITION: Overall cognitive status: Within functional limits for tasks assessed  SENSATION: WFL  POSTURE:  decreased cervical lordosis  PALPATION: Right upper trap, levator scap and cervical paraspinals with trigger points and tight bands   CERVICAL ROM:   Active ROM A/PROM (deg) eval A/PROM (deg) eval A/PROM (Deg) 10/07/22 A/PROM (Deg) 11/04/22  Flexion wnl WNL WNL WFL  Extension wnl WNL WNL WFL  Right lateral flexion 50% WNL WNL WFL with discomfort  Left lateral flexion 50% WNL WNL WFL  Right rotation wnl WNL WNL WFL  Left rotation wnl WNL WNL WFL   (Blank rows = not tested)  UPPER EXTREMITY ROM:  WFL  UPPER EXTREMITY MMT: Eval: Generally 4 to 4+/5 grossly throughout UE  09/11/22: 5/5 bilateral Ue's without c/o pain  10/07/22:  5/5 bilateral UE no c/o pain  11/04/22: 5/5 bilateral UE no c/o pain  TODAY'S TREATMENT:  DATE: 11/04/2022 UBE x 6 min fwd/back 3/3 3 way scapular stabilization with red loop x 10 each UE 4 D ball rolls x 20 each with yellow plyo ball Wall clocks with right shoulder only with light blue plyo ball  x 5  Standing shoulder flexion and scaption x 20 each with 2 lbs (verbal cues for correct technique- patient was doing deltoid raises) Prone drop n catch with light blue plyo ball x 20 each UE Mechanical traction 14 lbs, 2 steps, 100% speed, Hold 60 sec, rest for 10 sec x total of 15 min  DATE: 10/28/2022 UBE x 6 min fwd/back 3/3 Lat pull down standing 2 x 10 with 40 lbs Matrix row 2 x 10 with 30 lbs 3 way scapular stabilization with red loop x 10 each UE 4 D ball rolls x 20 each with yellow plyo ball Standing shoulder flexion and scaption x 20 each with 2 lbs (verbal cues for correct technique- patient was doing deltoid raises) Prone drop n catch with light blue plyo ball x 20 each UE Trigger Point Dry-Needling  Treatment instructions: Expect mild to moderate muscle soreness. S/S of pneumothorax if dry needled over a lung field, and to seek immediate medical attention should they occur. Patient verbalized understanding of these instructions and education. Patient Consent Given: Yes Education handout provided: Yes Muscles treated: bilateral upper traps and levator scapula Electrical stimulation performed: No Parameters: N/A Treatment response/outcome: Skilled palpation used to identify taut bands and trigger points.  Once identified, dry needling techniques used to treat these areas.  Deep ache bilaterally and twitch response on right ellicited along with palpable elongation of muscle.  Following treatment, patient reported mild soreness.     DATE: 10/23/2022 UBE x 5 min fwd only Lat pull down standing 2 x 10 with 40 lbs Matrix row 2 x 10 with 25 lbs 3 way scapular stabilization with red loop x 10 each UE 4 D ball rolls x 20 each with yellow plyo ball Standing shoulder flexion and scaption x 20 each with 2 lbs (verbal cues for correct technique- patient was doing deltoid raises) Prone drop n catch with light blue plyo ball x 20 each UE Trigger Point Dry-Needling  Treatment  instructions: Expect mild to moderate muscle soreness. S/S of pneumothorax if dry needled over a lung field, and to seek immediate medical attention should they occur. Patient verbalized understanding of these instructions and education. Patient Consent Given: Yes Education handout provided: Yes Muscles treated: bilateral upper traps and right rhomboids Electrical stimulation performed: No Parameters: N/A Treatment response/outcome: Skilled palpation used to identify taut bands and trigger points.  Once identified, dry needling techniques used to treat these areas.  Deep ache response ellicited along with palpable elongation of muscle.  Following treatment, patient reported mild soreness.      PATIENT EDUCATION:  Education details: Initiated HEP Person educated: Patient Education method: Programmer, multimedia, Facilities manager, Verbal cues, and Handouts Education comprehension: verbalized understanding, returned demonstration, and verbal cues required  HOME EXERCISE PROGRAM: Access Code: [email or web address] URL: [email or web address] Date: 09/03/2022 Prepared by: Mikey Kirschner  Exercises - Prone Shoulder Extension - Single Arm  - 2 x daily - 7 x weekly - 2 sets - 10 reps - Prone Shoulder Row  - 2 x daily - 7 x weekly - 2 sets - 10 reps - Prone Single Arm Shoulder Horizontal Abduction with Scapular Retraction and Palm Down  - 2 x daily - 7 x weekly - 2 sets - 10 reps - Sidelying Shoulder  External Rotation  - 2 x daily - 7 x weekly - 2 sets - 10 reps - Single Arm Serratus Punches in Supine with Dumbbell  - 2 x daily - 7 x weekly - 2 sets - 10 reps - Cat Cow  - 1 x daily - 7 x weekly - 2 sets - 10 reps - Standing Shoulder Flexion to 90 Degrees with Dumbbells  - 2 x daily - 7 x weekly - 2 sets - 10 reps - Standing Shoulder Scaption  - 2 x daily - 7 x weekly - 2 sets - 10 reps - Wall Clock with Theraband  - 1 x daily - 7 x weekly - 2 sets - 5 reps - Neck Mobilization with Foam Roller  - 1 x daily -  7 x weekly - 2 sets - 10 reps - Sidelying Open Book Thoracic Rotation with Knee on Foam Roll  - 1 x daily - 7 x weekly - 1-2 sets - 10 reps - Supine Shoulder External Rotation on Foam Roll with Theraband  - 1 x daily - 7 x weekly - 2 sets - 10 reps - Hooklying Scapular Protraction on Foam Roll  - 1 x daily - 7 x weekly - 2 sets - 10 reps - Seated Assisted Cervical Rotation with Towel  - 1 x daily - 7 x weekly - 1 sets - 10 reps - 2-3 sec hold  ASSESSMENT:  CLINICAL IMPRESSION: Lucero has received MRI results and is scheduled to see Dr. Danielle Dess for options.  Since we have not tried traction, we initiated this today to see if this will fully eliminate her radicular symptoms.  We would like to try this a few times with increasing pull each visit while she is waiting to see Dr. Danielle Dess.  She desires to avoid surgery if possible.  She is doing well with all of her postural strengthening and is compliant with her HEP.  Right shoulder strength is 5/5 throughout and we are not seeing any further symptoms of rotator cuff pathology.   We will go ahead and recertify for a few more weeks as she awaits to see Dr. Danielle Dess.  During this time, we will continue postural and shoulder strengthening along with addition of traction.    OBJECTIVE IMPAIRMENTS: decreased ROM, decreased strength, increased fascial restrictions, increased muscle spasms, impaired flexibility, postural dysfunction, and pain.   ACTIVITY LIMITATIONS: carrying, lifting, sleeping, transfers, bed mobility, dressing, reach over head, and hygiene/grooming  PARTICIPATION LIMITATIONS: meal prep, cleaning, laundry, driving, shopping, community activity, occupation, and church  PERSONAL FACTORS: Age and Time since onset of injury/illness/exacerbation are also affecting patient's functional outcome.   REHAB POTENTIAL: Good  CLINICAL DECISION MAKING: Stable/uncomplicated  EVALUATION COMPLEXITY: Low   GOALS: Goals reviewed with patient? Yes  SHORT  TERM GOALS: Target date: 08/19/2022   Pain report to be no greater than 4/10  Baseline:  Goal status: MET on 7/22  2.  Patient will be independent with initial HEP  Baseline:  Goal status: MET on 7/22   LONG TERM GOALS: Target date: 09/16/2022   Patient to report pain no greater than 2/10  Baseline:  Goal status: MET 10/14/22  2.  Patient to be independent with advanced HEP  Baseline:  Goal status: MET 10/14/22  3.  Patient to report 85% improvement in overall symptoms Baseline: 80% reported on 11/04/22 Goal status: Ongoing   4.  Patient to be able to sleep through the night  Baseline:  Goal status: MET 10/14/22  11/04/22 beginning to have some trouble getting comfortable again  5.  FOTO to be 70 Baseline: scored 69 today Goal status: In progress 11/04/22  6.  Patient will be able to reach overhead into cabinets and on top of shelves without right shoulder or neck pain  Baseline:  Goal status: MET 11/04/22   PLAN:  PT FREQUENCY: 2x/week  PT DURATION: 4 weeks  PLANNED INTERVENTIONS: Therapeutic exercises, Therapeutic activity, Neuromuscular re-education, Balance training, Patient/Family education, Self Care, Joint mobilization, Vestibular training, DME instructions, Aquatic Therapy, Dry Needling, Electrical stimulation, Spinal mobilization, Cryotherapy, Moist heat, scar mobilization, Splintting, Taping, Vasopneumatic device, Traction, Ionotophoresis 4mg /ml Dexamethasone, Manual therapy, and Re-evaluation  PLAN FOR NEXT SESSION: We will go ahead and recertify for a few more weeks as she awaits to see Dr. Danielle Dess.  During this time, we will continue postural and shoulder strengthening along with addition of traction.    Victorino Dike B. Azekiel Cremer, PT 11/04/22 9:34 AM [facility] [address] [place], [place] [address] Phone # [phone] Fax [phone]

## 2022-11-07 DIAGNOSIS — M5412 Radiculopathy, cervical region: Secondary | ICD-10-CM | POA: Diagnosis not present

## 2022-11-07 DIAGNOSIS — Z6829 Body mass index (BMI) 29.0-29.9, adult: Secondary | ICD-10-CM | POA: Diagnosis not present

## 2022-11-11 DIAGNOSIS — L858 Other specified epidermal thickening: Secondary | ICD-10-CM | POA: Diagnosis not present

## 2022-11-11 DIAGNOSIS — D485 Neoplasm of uncertain behavior of skin: Secondary | ICD-10-CM | POA: Diagnosis not present

## 2022-11-11 DIAGNOSIS — Z85828 Personal history of other malignant neoplasm of skin: Secondary | ICD-10-CM | POA: Diagnosis not present

## 2022-11-12 ENCOUNTER — Ambulatory Visit: Payer: Medicare Other

## 2022-11-12 DIAGNOSIS — M542 Cervicalgia: Secondary | ICD-10-CM

## 2022-11-12 DIAGNOSIS — R252 Cramp and spasm: Secondary | ICD-10-CM

## 2022-11-12 DIAGNOSIS — M6281 Muscle weakness (generalized): Secondary | ICD-10-CM | POA: Diagnosis not present

## 2022-11-12 DIAGNOSIS — R293 Abnormal posture: Secondary | ICD-10-CM | POA: Diagnosis not present

## 2022-11-12 NOTE — Therapy (Signed)
OUTPATIENT PHYSICAL THERAPY RECERT NOTE  Patient Name: Tammy Weeks MRN: [id number] DOB:16-Oct-1955, 67 y.o., female Today's Date: 11/12/2022  END OF SESSION:  PT End of Session - 11/12/22 0847     Visit Number 16    Date for PT Re-Evaluation 12/30/22    Authorization Type MEDICARE PART A AND B    Progress Note Due on Visit 20    PT Start Time 0848    PT Stop Time 0937    PT Time Calculation (min) 49 min    Activity Tolerance Patient tolerated treatment well    Behavior During Therapy WFL for tasks assessed/performed              Past Medical History:  Diagnosis Date   Anxiety    GERD (gastroesophageal reflux disease)    High cholesterol    Hypertension    Past Surgical History:  Procedure Laterality Date   ABDOMINAL HYSTERECTOMY     ADRENAL GLAND SURGERY     CATARACT EXTRACTION     LAPAROSCOPIC APPENDECTOMY N/A 01/29/2022   Procedure: APPENDECTOMY LAPAROSCOPIC;  Surgeon: Axel Filler, MD;  Location: [facility];  Service: General;  Laterality: N/A;   TRIGGER FINGER RELEASE Left 03/05/2021   Procedure: RELEASE TRIGGER FINGER/A-1 PULLEY LEFT RING FINGER;  Surgeon: Cindee Salt, MD;  Location: [facility];  Service: Orthopedics;  Laterality: Left;  45 MIN   Patient Active Problem List   Diagnosis Date Noted   DDD (degenerative disc disease), cervical 07/18/2022   Lumbosacral spondylosis 05/21/2020   Coughing 01/09/2020   Herpes simplex 07/21/2019   Orbital wrinkles 08/19/2018   Primary osteoarthritis of both knees 07/13/2015   Low grade squamous intraepithelial lesion (LGSIL) on cervicovaginal cytologic smear 03/19/2011    PCP: Georgann Housekeeper, MD  REFERRING PROVIDER: Monica Becton, MD  REFERRING DIAG: M50.30 (ICD-10-CM) - DDD (degenerative disc disease), cervical  THERAPY DIAG:  Cervicalgia  Muscle weakness (generalized)  Cramp and spasm  Abnormal posture  Rationale for Evaluation and Treatment: Rehabilitation  ONSET DATE:  07/18/2022  SUBJECTIVE:                                                                                                                                                                                                         SUBJECTIVE STATEMENT: Patient reports she received the MRI results and Dr. Danielle Dess would like to meet with her.   We discussed traction and she would like to try this.  Her cert date runs out today but we discussed extending at least until she meets with Dr.  Elsner.  Pain level 2/10  Hand dominance: Right  PERTINENT HISTORY:  Evaluate and Treat. 1-2 times per week for 4-6 weeks. Decrease pain, increase strength, flexibility, function, and range of motion. Modalities may include, traction, ionto, phono, and stim. May include dry needling with or without stim.  PAIN:  PAIN:  Are you having pain? Yes NPRS scale: 2/10 (over the weekend, I got up to around 4/10) Pain location: cervical and lats PAIN TYPE: aching Pain description: intermittent  Aggravating factors: stress, increased activity Relieving factors: massage   PRECAUTIONS: None  WEIGHT BEARING RESTRICTIONS: No  FALLS:  Has patient fallen in last 6 months? No  LIVING ENVIRONMENT: Lives with: lives with their spouse  OCCUPATION: Hx of sales and traveling  PLOF: Independent, Independent with basic ADLs, Independent with household mobility without device, Independent with community mobility without device, Independent with homemaking with ambulation, Independent with gait, Independent with transfers, Vocation/Vocational requirements: retired , and Leisure: walks, volunteers.   PATIENT GOALS: to eliminate neck and arm pain  NEXT MD VISIT: 09/05/22  OBJECTIVE:   DIAGNOSTIC FINDINGS:  Cervical Radiograph on 07/18/2022: IMPRESSION: 1. Stable lower cervical spondylosis.  No acute bony abnormality.  11/04/22: most recent MRI IMPRESSION: 1. Straightening of cervical lordosis with subtle  degenerative anterolisthesis of C3 on C4. No acute osseous abnormality. 2. Moderate C4-C5 disc degeneration with a broad-based posterior component, mild mass effect on the ventral right spinal cord, but no spinal stenosis or cord signal abnormality. Associated moderate to severe right neural foraminal stenosis, query right C5 radiculitis. 3. Mild to moderate left C6 neural foraminal stenosis. Up to mild right C4 and C6 neural foraminal stenosis.  PATIENT SURVEYS:  Eval:  FOTO 66, goal is 70   09/11/22: FOTO 61, goal is 70   10/07/22: FOTO 65 , goal is 70   11/04/22: FOTO 69, goal is 70  COGNITION: Overall cognitive status: Within functional limits for tasks assessed  SENSATION: WFL  POSTURE:  decreased cervical lordosis  PALPATION: Right upper trap, levator scap and cervical paraspinals with trigger points and tight bands   CERVICAL ROM:   Active ROM A/PROM (deg) eval A/PROM (deg) eval A/PROM (Deg) 10/07/22 A/PROM (Deg) 11/04/22  Flexion wnl WNL WNL WFL  Extension wnl WNL WNL WFL  Right lateral flexion 50% WNL WNL WFL with discomfort  Left lateral flexion 50% WNL WNL WFL  Right rotation wnl WNL WNL WFL  Left rotation wnl WNL WNL WFL   (Blank rows = not tested)  UPPER EXTREMITY ROM:  WFL  UPPER EXTREMITY MMT: Eval: Generally 4 to 4+/5 grossly throughout UE  09/11/22: 5/5 bilateral Ue's without c/o pain  10/07/22:  5/5 bilateral UE no c/o pain  11/04/22: 5/5 bilateral UE no c/o pain  TODAY'S TREATMENT:                                                                                                                              DATE: 11/12/2022  UBE x 6 min fwd/back 3/3 3 way scapular stabilization with red loop x 10 each UE 4 D ball rolls x 20 each with yellow plyo ball Trigger Point Dry-Needling  Treatment instructions: Expect mild to moderate muscle soreness. S/S of pneumothorax if dry needled over a lung field, and to seek immediate medical attention should  they occur. Patient verbalized understanding of these instructions and education. Patient Consent Given: Yes Education handout provided: Yes Muscles treated: bilateral upper traps Electrical stimulation performed: No Parameters: N/A Treatment response/outcome: Skilled palpation used to identify taut bands and trigger points.  Once identified, dry needling techniques used to treat these areas.  Twitch response ellicited in right upper trap along with palpable elongation of muscle.  Following treatment, patient reported relief of muscle tension in the right upper trap.  She states the left side felt fine.   Mechanical traction 14 lbs, 2 steps, 100% speed, Hold 60 sec, rest for 10 sec x total of 15 min  DATE: 11/04/2022 UBE x 6 min fwd/back 3/3 3 way scapular stabilization with red loop x 10 each UE 4 D ball rolls x 20 each with yellow plyo ball Wall clocks with right shoulder only with light blue plyo ball x 5  Standing shoulder flexion and scaption x 20 each with 2 lbs (verbal cues for correct technique- patient was doing deltoid raises) Prone drop n catch with light blue plyo ball x 20 each UE Mechanical traction 14 lbs, 2 steps, 100% speed, Hold 60 sec, rest for 10 sec x total of 15 min  DATE: 10/28/2022 UBE x 6 min fwd/back 3/3 Lat pull down standing 2 x 10 with 40 lbs Matrix row 2 x 10 with 30 lbs 3 way scapular stabilization with red loop x 10 each UE 4 D ball rolls x 20 each with yellow plyo ball Standing shoulder flexion and scaption x 20 each with 2 lbs (verbal cues for correct technique- patient was doing deltoid raises) Prone drop n catch with light blue plyo ball x 20 each UE Trigger Point Dry-Needling  Treatment instructions: Expect mild to moderate muscle soreness. S/S of pneumothorax if dry needled over a lung field, and to seek immediate medical attention should they occur. Patient verbalized understanding of these instructions and education. Patient Consent Given:  Yes Education handout provided: Yes Muscles treated: bilateral upper traps and levator scapula Electrical stimulation performed: No Parameters: N/A Treatment response/outcome: Skilled palpation used to identify taut bands and trigger points.  Once identified, dry needling techniques used to treat these areas.  Deep ache bilaterally and twitch response on right ellicited along with palpable elongation of muscle.  Following treatment, patient reported mild soreness.      PATIENT EDUCATION:  Education details: Initiated HEP Person educated: Patient Education method: Programmer, multimedia, Facilities manager, Verbal cues, and Handouts Education comprehension: verbalized understanding, returned demonstration, and verbal cues required  HOME EXERCISE PROGRAM: Access Code: [email or web address] URL: [email or web address] Date: 09/03/2022 Prepared by: Mikey Kirschner  Exercises - Prone Shoulder Extension - Single Arm  - 2 x daily - 7 x weekly - 2 sets - 10 reps - Prone Shoulder Row  - 2 x daily - 7 x weekly - 2 sets - 10 reps - Prone Single Arm Shoulder Horizontal Abduction with Scapular Retraction and Palm Down  - 2 x daily - 7 x weekly - 2 sets - 10 reps - Sidelying Shoulder External Rotation  - 2 x daily - 7 x weekly - 2 sets - 10 reps - Single Arm  Serratus Punches in Supine with Dumbbell  - 2 x daily - 7 x weekly - 2 sets - 10 reps - Cat Cow  - 1 x daily - 7 x weekly - 2 sets - 10 reps - Standing Shoulder Flexion to 90 Degrees with Dumbbells  - 2 x daily - 7 x weekly - 2 sets - 10 reps - Standing Shoulder Scaption  - 2 x daily - 7 x weekly - 2 sets - 10 reps - Wall Clock with Theraband  - 1 x daily - 7 x weekly - 2 sets - 5 reps - Neck Mobilization with Foam Roller  - 1 x daily - 7 x weekly - 2 sets - 10 reps - Sidelying Open Book Thoracic Rotation with Knee on Foam Roll  - 1 x daily - 7 x weekly - 1-2 sets - 10 reps - Supine Shoulder External Rotation on Foam Roll with Theraband  - 1 x daily - 7 x  weekly - 2 sets - 10 reps - Hooklying Scapular Protraction on Foam Roll  - 1 x daily - 7 x weekly - 2 sets - 10 reps - Seated Assisted Cervical Rotation with Towel  - 1 x daily - 7 x weekly - 1 sets - 10 reps - 2-3 sec hold  ASSESSMENT:  CLINICAL IMPRESSION: Vikki Ports met with Dr. Danielle Dess.  Per Dr. Danielle Dess, patient is a surgical candidate but felt that given her mild symptoms at this time, he would recommend holding on surgery and monitoring symptoms over the next month or so with therapy.  Agreed with traction and treatment plan.  She has decided to hold on the surgery and try a few more weeks of PT then see how she does on her own.   During this time, we will continue postural and shoulder strengthening along with traction and dry needling.  She would benefit from formal PT to help monitor symptoms and progress postural strengthening.    OBJECTIVE IMPAIRMENTS: decreased ROM, decreased strength, increased fascial restrictions, increased muscle spasms, impaired flexibility, postural dysfunction, and pain.   ACTIVITY LIMITATIONS: carrying, lifting, sleeping, transfers, bed mobility, dressing, reach over head, and hygiene/grooming  PARTICIPATION LIMITATIONS: meal prep, cleaning, laundry, driving, shopping, community activity, occupation, and church  PERSONAL FACTORS: Age and Time since onset of injury/illness/exacerbation are also affecting patient's functional outcome.   REHAB POTENTIAL: Good  CLINICAL DECISION MAKING: Stable/uncomplicated  EVALUATION COMPLEXITY: Low   GOALS: Goals reviewed with patient? Yes  SHORT TERM GOALS: Target date: 08/19/2022   Pain report to be no greater than 4/10  Baseline:  Goal status: MET on 7/22  2.  Patient will be independent with initial HEP  Baseline:  Goal status: MET on 7/22   LONG TERM GOALS: Target date: 09/16/2022   Patient to report pain no greater than 2/10  Baseline:  Goal status: MET 10/14/22  2.  Patient to be independent with advanced  HEP  Baseline:  Goal status: MET 10/14/22  3.  Patient to report 85% improvement in overall symptoms Baseline: 80% reported on 11/04/22 Goal status: Ongoing   4.  Patient to be able to sleep through the night  Baseline:  Goal status: MET 10/14/22  11/04/22 beginning to have some trouble getting comfortable again  5.  FOTO to be 70 Baseline: scored 69 today Goal status: In progress 11/04/22  6.  Patient will be able to reach overhead into cabinets and on top of shelves without right shoulder or neck pain  Baseline:  Goal status: MET 11/04/22   PLAN:  PT FREQUENCY: 2x/week  PT DURATION: 4 weeks  PLANNED INTERVENTIONS: Therapeutic exercises, Therapeutic activity, Neuromuscular re-education, Balance training, Patient/Family education, Self Care, Joint mobilization, Vestibular training, DME instructions, Aquatic Therapy, Dry Needling, Electrical stimulation, Spinal mobilization, Cryotherapy, Moist heat, scar mobilization, Splintting, Taping, Vasopneumatic device, Traction, Ionotophoresis 4mg /ml Dexamethasone, Manual therapy, and Re-evaluation  PLAN FOR NEXT SESSION: Continue with postural strengthening and traction along with DN as needed.     Victorino Dike B. Aiyanah Kalama, PT 11/12/22 9:32 AM [facility] [address], [address] [place], [place] [address] Phone # [phone] Fax [phone]

## 2022-11-27 ENCOUNTER — Ambulatory Visit: Payer: Medicare Other | Attending: Sports Medicine

## 2022-11-27 DIAGNOSIS — M6281 Muscle weakness (generalized): Secondary | ICD-10-CM | POA: Insufficient documentation

## 2022-11-27 DIAGNOSIS — R252 Cramp and spasm: Secondary | ICD-10-CM | POA: Diagnosis present

## 2022-11-27 DIAGNOSIS — M542 Cervicalgia: Secondary | ICD-10-CM | POA: Diagnosis not present

## 2022-11-27 DIAGNOSIS — R293 Abnormal posture: Secondary | ICD-10-CM | POA: Diagnosis present

## 2022-11-27 NOTE — Therapy (Signed)
OUTPATIENT PHYSICAL THERAPY RECERT NOTE  Patient Name: Tammy Weeks MRN: [id number] DOB:06/06/1955, 67 y.o., female Today's Date: 11/27/2022  END OF SESSION:  PT End of Session - 11/27/22 1656     Visit Number 17    Date for PT Re-Evaluation 12/30/22    Authorization Type MEDICARE PART A AND B    Progress Note Due on Visit 20    PT Start Time 1618    PT Stop Time 1700    PT Time Calculation (min) 42 min    Activity Tolerance Patient tolerated treatment well    Behavior During Therapy WFL for tasks assessed/performed               Past Medical History:  Diagnosis Date   Anxiety    GERD (gastroesophageal reflux disease)    High cholesterol    Hypertension    Past Surgical History:  Procedure Laterality Date   ABDOMINAL HYSTERECTOMY     ADRENAL GLAND SURGERY     CATARACT EXTRACTION     LAPAROSCOPIC APPENDECTOMY N/A 01/29/2022   Procedure: APPENDECTOMY LAPAROSCOPIC;  Surgeon: Axel Filler, MD;  Location: [facility];  Service: General;  Laterality: N/A;   TRIGGER FINGER RELEASE Left 03/05/2021   Procedure: RELEASE TRIGGER FINGER/A-1 PULLEY LEFT RING FINGER;  Surgeon: Cindee Salt, MD;  Location: [facility];  Service: Orthopedics;  Laterality: Left;  45 MIN   Patient Active Problem List   Diagnosis Date Noted   DDD (degenerative disc disease), cervical 07/18/2022   Lumbosacral spondylosis 05/21/2020   Coughing 01/09/2020   Herpes simplex 07/21/2019   Orbital wrinkles 08/19/2018   Primary osteoarthritis of both knees 07/13/2015   Low grade squamous intraepithelial lesion (LGSIL) on cervicovaginal cytologic smear 03/19/2011    PCP: Georgann Housekeeper, MD  REFERRING PROVIDER: Monica Becton, MD  REFERRING DIAG: M50.30 (ICD-10-CM) - DDD (degenerative disc disease), cervical  THERAPY DIAG:  Cervicalgia  Cramp and spasm  Abnormal posture  Muscle weakness (generalized)  Rationale for Evaluation and Treatment: Rehabilitation  ONSET DATE:  07/18/2022  SUBJECTIVE:                                                                                                                                                                                                         SUBJECTIVE STATEMENT: Patient has met with Dr. Danielle Dess.  She explains that he did not feel she needed surgery at this time but to monitor her symptoms.  If the numbness and tingling worsened or she developed weakness in the hand, to contact him or come in  for further discussion.  He encouraged her to continue her PT and sent new Rx if needed.  However, patient still has active referral with Dr. Benjamin Stain.  She won't need this referral at this time.  She states that the numbness/tingling is not present today.   Hand dominance: Right  PERTINENT HISTORY:  Evaluate and Treat. 1-2 times per week for 4-6 weeks. Decrease pain, increase strength, flexibility, function, and range of motion. Modalities may include, traction, ionto, phono, and stim. May include dry needling with or without stim.  PAIN:  PAIN:  Are you having pain? Yes NPRS scale: 2/10 (over the weekend, I got up to around 4/10) Pain location: cervical and lats PAIN TYPE: aching Pain description: intermittent  Aggravating factors: stress, increased activity Relieving factors: massage   PRECAUTIONS: None  WEIGHT BEARING RESTRICTIONS: No  FALLS:  Has patient fallen in last 6 months? No  LIVING ENVIRONMENT: Lives with: lives with their spouse  OCCUPATION: Hx of sales and traveling  PLOF: Independent, Independent with basic ADLs, Independent with household mobility without device, Independent with community mobility without device, Independent with homemaking with ambulation, Independent with gait, Independent with transfers, Vocation/Vocational requirements: retired , and Leisure: walks, volunteers.   PATIENT GOALS: to eliminate neck and arm pain  NEXT MD VISIT: 09/05/22  OBJECTIVE:   DIAGNOSTIC  FINDINGS:  Cervical Radiograph on 07/18/2022: IMPRESSION: 1. Stable lower cervical spondylosis.  No acute bony abnormality.  11/04/22: most recent MRI IMPRESSION: 1. Straightening of cervical lordosis with subtle degenerative anterolisthesis of C3 on C4. No acute osseous abnormality. 2. Moderate C4-C5 disc degeneration with a broad-based posterior component, mild mass effect on the ventral right spinal cord, but no spinal stenosis or cord signal abnormality. Associated moderate to severe right neural foraminal stenosis, query right C5 radiculitis. 3. Mild to moderate left C6 neural foraminal stenosis. Up to mild right C4 and C6 neural foraminal stenosis.  PATIENT SURVEYS:  Eval:  FOTO 66, goal is 70   09/11/22: FOTO 61, goal is 70   10/07/22: FOTO 65 , goal is 70   11/04/22: FOTO 69, goal is 70  COGNITION: Overall cognitive status: Within functional limits for tasks assessed  SENSATION: WFL  POSTURE:  decreased cervical lordosis  PALPATION: Right upper trap, levator scap and cervical paraspinals with trigger points and tight bands   CERVICAL ROM:   Active ROM A/PROM (deg) eval A/PROM (deg) eval A/PROM (Deg) 10/07/22 A/PROM (Deg) 11/04/22  Flexion wnl WNL WNL WFL  Extension wnl WNL WNL WFL  Right lateral flexion 50% WNL WNL WFL with discomfort  Left lateral flexion 50% WNL WNL WFL  Right rotation wnl WNL WNL WFL  Left rotation wnl WNL WNL WFL   (Blank rows = not tested)  UPPER EXTREMITY ROM:  WFL  UPPER EXTREMITY MMT: Eval: Generally 4 to 4+/5 grossly throughout UE  09/11/22: 5/5 bilateral Ue's without c/o pain  10/07/22:  5/5 bilateral UE no c/o pain  11/04/22: 5/5 bilateral UE no c/o pain  TODAY'S TREATMENT:  DATE: 11/27/2022 UBE x 6 min fwd/back 3/3 Trigger Point Dry-Needling  Treatment instructions: Expect mild to moderate muscle  soreness. S/S of pneumothorax if dry needled over a lung field, and to seek immediate medical attention should they occur. Patient verbalized understanding of these instructions and education. Patient Consent Given: Yes Education handout provided: Yes Muscles treated: bilateral upper traps Electrical stimulation performed: No Parameters: N/A Treatment response/outcome: Skilled palpation used to identify taut bands and trigger points.  Once identified, dry needling techniques used to treat these areas.  Heavy twitch response ellicited in right upper trap > left along with palpable elongation of muscle.  Following treatment, patient reported relief of muscle tension in both upper traps.   Mechanical traction 14 lbs, 2 steps, 100% speed, Hold 60 sec, rest for 10 sec x total of 15 min  DATE: 11/12/2022 UBE x 6 min fwd/back 3/3 3 way scapular stabilization with red loop x 10 each UE 4 D ball rolls x 20 each with yellow plyo ball Trigger Point Dry-Needling  Treatment instructions: Expect mild to moderate muscle soreness. S/S of pneumothorax if dry needled over a lung field, and to seek immediate medical attention should they occur. Patient verbalized understanding of these instructions and education. Patient Consent Given: Yes Education handout provided: Yes Muscles treated: bilateral upper traps Electrical stimulation performed: No Parameters: N/A Treatment response/outcome: Skilled palpation used to identify taut bands and trigger points.  Once identified, dry needling techniques used to treat these areas.  Twitch response ellicited in right upper trap along with palpable elongation of muscle.  Following treatment, patient reported relief of muscle tension in the right upper trap.  She states the left side felt fine.   Mechanical traction 14 lbs, 2 steps, 100% speed, Hold 60 sec, rest for 10 sec x total of 15 min  DATE: 11/04/2022 UBE x 6 min fwd/back 3/3 3 way scapular stabilization with red  loop x 10 each UE 4 D ball rolls x 20 each with yellow plyo ball Wall clocks with right shoulder only with light blue plyo ball x 5  Standing shoulder flexion and scaption x 20 each with 2 lbs (verbal cues for correct technique- patient was doing deltoid raises) Prone drop n catch with light blue plyo ball x 20 each UE Mechanical traction 14 lbs, 2 steps, 100% speed, Hold 60 sec, rest for 10 sec x total of 15 min   PATIENT EDUCATION:  Education details: Initiated HEP Person educated: Patient Education method: Programmer, multimedia, Facilities manager, Verbal cues, and Handouts Education comprehension: verbalized understanding, returned demonstration, and verbal cues required  HOME EXERCISE PROGRAM: Access Code: [email or web address] URL: [email or web address] Date: 09/03/2022 Prepared by: Mikey Kirschner  Exercises - Prone Shoulder Extension - Single Arm  - 2 x daily - 7 x weekly - 2 sets - 10 reps - Prone Shoulder Row  - 2 x daily - 7 x weekly - 2 sets - 10 reps - Prone Single Arm Shoulder Horizontal Abduction with Scapular Retraction and Palm Down  - 2 x daily - 7 x weekly - 2 sets - 10 reps - Sidelying Shoulder External Rotation  - 2 x daily - 7 x weekly - 2 sets - 10 reps - Single Arm Serratus Punches in Supine with Dumbbell  - 2 x daily - 7 x weekly - 2 sets - 10 reps - Cat Cow  - 1 x daily - 7 x weekly - 2 sets - 10 reps - Standing Shoulder Flexion to 90 Degrees with Dumbbells  -  2 x daily - 7 x weekly - 2 sets - 10 reps - Standing Shoulder Scaption  - 2 x daily - 7 x weekly - 2 sets - 10 reps - Wall Clock with Theraband  - 1 x daily - 7 x weekly - 2 sets - 5 reps - Neck Mobilization with Foam Roller  - 1 x daily - 7 x weekly - 2 sets - 10 reps - Sidelying Open Book Thoracic Rotation with Knee on Foam Roll  - 1 x daily - 7 x weekly - 1-2 sets - 10 reps - Supine Shoulder External Rotation on Foam Roll with Theraband  - 1 x daily - 7 x weekly - 2 sets - 10 reps - Hooklying Scapular Protraction  on Foam Roll  - 1 x daily - 7 x weekly - 2 sets - 10 reps - Seated Assisted Cervical Rotation with Towel  - 1 x daily - 7 x weekly - 1 sets - 10 reps - 2-3 sec hold  ASSESSMENT:  CLINICAL IMPRESSION: Ondria seems to have responded to traction and had decreased radicular symptoms.  She states she has no numbness/tingling in her fingertips today.  She had fairly active muscle twitches in both upper traps today.  Her right side was very active.  She tolerated traction without any discomfort.  We maintained the 14 lbs of pull per her request.  She would benefit from continuing skilled PT for several visits to assess affects of cervical traction along with her other treatment options.  We may suggest home [facility] unit if patient is having resolution of radicular symptoms.    OBJECTIVE IMPAIRMENTS: decreased ROM, decreased strength, increased fascial restrictions, increased muscle spasms, impaired flexibility, postural dysfunction, and pain.   ACTIVITY LIMITATIONS: carrying, lifting, sleeping, transfers, bed mobility, dressing, reach over head, and hygiene/grooming  PARTICIPATION LIMITATIONS: meal prep, cleaning, laundry, driving, shopping, community activity, occupation, and church  PERSONAL FACTORS: Age and Time since onset of injury/illness/exacerbation are also affecting patient's functional outcome.   REHAB POTENTIAL: Good  CLINICAL DECISION MAKING: Stable/uncomplicated  EVALUATION COMPLEXITY: Low   GOALS: Goals reviewed with patient? Yes  SHORT TERM GOALS: Target date: 08/19/2022   Pain report to be no greater than 4/10  Baseline:  Goal status: MET on 7/22  2.  Patient will be independent with initial HEP  Baseline:  Goal status: MET on 7/22   LONG TERM GOALS: Target date: 09/16/2022   Patient to report pain no greater than 2/10  Baseline:  Goal status: MET 10/14/22  2.  Patient to be independent with advanced HEP  Baseline:  Goal status: MET 10/14/22  3.  Patient to  report 85% improvement in overall symptoms Baseline: 80% reported on 11/04/22 Goal status: Ongoing   4.  Patient to be able to sleep through the night  Baseline:  Goal status: MET 10/14/22  11/04/22 beginning to have some trouble getting comfortable again  5.  FOTO to be 70 Baseline: scored 69 today Goal status: In progress 11/04/22  6.  Patient will be able to reach overhead into cabinets and on top of shelves without right shoulder or neck pain  Baseline:  Goal status: MET 11/04/22   PLAN:  PT FREQUENCY: 2x/week  PT DURATION: 4 weeks  PLANNED INTERVENTIONS: Therapeutic exercises, Therapeutic activity, Neuromuscular re-education, Balance training, Patient/Family education, Self Care, Joint mobilization, Vestibular training, DME instructions, Aquatic Therapy, Dry Needling, Electrical stimulation, Spinal mobilization, Cryotherapy, Moist heat, scar mobilization, Splintting, Taping, Vasopneumatic device, Traction, Ionotophoresis 4mg /ml Dexamethasone,  Manual therapy, and Re-evaluation  PLAN FOR NEXT SESSION: Continue with postural strengthening and traction along with DN as needed.     Victorino Dike B. Welton Bord, PT 11/27/22 5:06 PM [facility] [address] [place], [place] [address] Phone # [phone] Fax [phone]

## 2022-12-03 ENCOUNTER — Ambulatory Visit: Payer: Medicare Other

## 2022-12-04 ENCOUNTER — Ambulatory Visit: Payer: Medicare Other

## 2022-12-04 DIAGNOSIS — M6281 Muscle weakness (generalized): Secondary | ICD-10-CM

## 2022-12-04 DIAGNOSIS — R252 Cramp and spasm: Secondary | ICD-10-CM | POA: Diagnosis not present

## 2022-12-04 DIAGNOSIS — R293 Abnormal posture: Secondary | ICD-10-CM

## 2022-12-04 DIAGNOSIS — M542 Cervicalgia: Secondary | ICD-10-CM

## 2022-12-04 NOTE — Therapy (Signed)
OUTPATIENT PHYSICAL THERAPY RECERT NOTE  Patient Name: Tammy Weeks MRN: [id number] DOB:10-Sep-1955, 67 y.o., female Today's Date: 12/04/2022  END OF SESSION:  PT End of Session - 12/04/22 1449     Visit Number 18    Date for PT Re-Evaluation 12/30/22    Authorization Type MEDICARE PART A AND B    Progress Note Due on Visit 20    PT Start Time 1449    PT Stop Time 1524    PT Time Calculation (min) 35 min    Activity Tolerance Patient tolerated treatment well    Behavior During Therapy WFL for tasks assessed/performed               Past Medical History:  Diagnosis Date   Anxiety    GERD (gastroesophageal reflux disease)    High cholesterol    Hypertension    Past Surgical History:  Procedure Laterality Date   ABDOMINAL HYSTERECTOMY     ADRENAL GLAND SURGERY     CATARACT EXTRACTION     LAPAROSCOPIC APPENDECTOMY N/A 01/29/2022   Procedure: APPENDECTOMY LAPAROSCOPIC;  Surgeon: Axel Filler, MD;  Location: [facility];  Service: General;  Laterality: N/A;   TRIGGER FINGER RELEASE Left 03/05/2021   Procedure: RELEASE TRIGGER FINGER/A-1 PULLEY LEFT RING FINGER;  Surgeon: Cindee Salt, MD;  Location: [facility];  Service: Orthopedics;  Laterality: Left;  45 MIN   Patient Active Problem List   Diagnosis Date Noted   DDD (degenerative disc disease), cervical 07/18/2022   Lumbosacral spondylosis 05/21/2020   Coughing 01/09/2020   Herpes simplex 07/21/2019   Orbital wrinkles 08/19/2018   Primary osteoarthritis of both knees 07/13/2015   Low grade squamous intraepithelial lesion (LGSIL) on cervicovaginal cytologic smear 03/19/2011    PCP: Georgann Housekeeper, MD  REFERRING PROVIDER: Monica Becton, MD  REFERRING DIAG: M50.30 (ICD-10-CM) - DDD (degenerative disc disease), cervical  THERAPY DIAG:  Cervicalgia  Cramp and spasm  Abnormal posture  Muscle weakness (generalized)  Rationale for Evaluation and Treatment: Rehabilitation  ONSET DATE:  07/18/2022  SUBJECTIVE:                                                                                                                                                                                                         SUBJECTIVE STATEMENT: Patient reports she is doing good.  The radicular symptoms are resolving.  Pain is more controlled.    Hand dominance: Right  PERTINENT HISTORY:  Evaluate and Treat. 1-2 times per week for 4-6 weeks. Decrease pain, increase strength, flexibility, function, and range  of motion. Modalities may include, traction, ionto, phono, and stim. May include dry needling with or without stim.  PAIN:  PAIN:  Are you having pain? Yes NPRS scale: 2/10 (over the weekend, I got up to around 4/10) Pain location: cervical and lats PAIN TYPE: aching Pain description: intermittent  Aggravating factors: stress, increased activity Relieving factors: massage   PRECAUTIONS: None  WEIGHT BEARING RESTRICTIONS: No  FALLS:  Has patient fallen in last 6 months? No  LIVING ENVIRONMENT: Lives with: lives with their spouse  OCCUPATION: Hx of sales and traveling  PLOF: Independent, Independent with basic ADLs, Independent with household mobility without device, Independent with community mobility without device, Independent with homemaking with ambulation, Independent with gait, Independent with transfers, Vocation/Vocational requirements: retired , and Leisure: walks, volunteers.   PATIENT GOALS: to eliminate neck and arm pain  NEXT MD VISIT: 09/05/22  OBJECTIVE:   DIAGNOSTIC FINDINGS:  Cervical Radiograph on 07/18/2022: IMPRESSION: 1. Stable lower cervical spondylosis.  No acute bony abnormality.  11/04/22: most recent MRI IMPRESSION: 1. Straightening of cervical lordosis with subtle degenerative anterolisthesis of C3 on C4. No acute osseous abnormality. 2. Moderate C4-C5 disc degeneration with a broad-based posterior component, mild mass effect on the  ventral right spinal cord, but no spinal stenosis or cord signal abnormality. Associated moderate to severe right neural foraminal stenosis, query right C5 radiculitis. 3. Mild to moderate left C6 neural foraminal stenosis. Up to mild right C4 and C6 neural foraminal stenosis.  PATIENT SURVEYS:  Eval:  FOTO 66, goal is 70   09/11/22: FOTO 61, goal is 70   10/07/22: FOTO 65 , goal is 70   11/04/22: FOTO 69, goal is 70  COGNITION: Overall cognitive status: Within functional limits for tasks assessed  SENSATION: WFL  POSTURE:  decreased cervical lordosis  PALPATION: Right upper trap, levator scap and cervical paraspinals with trigger points and tight bands   CERVICAL ROM:   Active ROM A/PROM (deg) eval A/PROM (deg) eval A/PROM (Deg) 10/07/22 A/PROM (Deg) 11/04/22  Flexion wnl WNL WNL WFL  Extension wnl WNL WNL WFL  Right lateral flexion 50% WNL WNL WFL with discomfort  Left lateral flexion 50% WNL WNL WFL  Right rotation wnl WNL WNL WFL  Left rotation wnl WNL WNL WFL   (Blank rows = not tested)  UPPER EXTREMITY ROM:  WFL  UPPER EXTREMITY MMT: Eval: Generally 4 to 4+/5 grossly throughout UE  09/11/22: 5/5 bilateral Ue's without c/o pain  10/07/22:  5/5 bilateral UE no c/o pain  11/04/22: 5/5 bilateral UE no c/o pain  TODAY'S TREATMENT:                                                                                                                              DATE: 12/04/2022 UBE x 6 min fwd/back 3/3 Trigger Point Dry-Needling  Treatment instructions: Expect mild to moderate muscle soreness. S/S of pneumothorax if dry needled over a lung field, and to seek  immediate medical attention should they occur. Patient verbalized understanding of these instructions and education. Patient Consent Given: Yes Education handout provided: Yes Muscles treated: bilateral upper traps Electrical stimulation performed: No Parameters: N/A Treatment response/outcome: Skilled  palpation used to identify taut bands and trigger points.  Once identified, dry needling techniques used to treat these areas.  Heavy twitch response ellicited in both upper traps today, with palpable elongation of muscle.  Following treatment, patient reported relief of muscle tension in both upper traps.   Mechanical traction 14 lbs, 2 steps, 100% speed, Hold 60 sec, rest for 10 sec x total of 15 min  DATE: 11/27/2022 UBE x 6 min fwd/back 3/3 Trigger Point Dry-Needling  Treatment instructions: Expect mild to moderate muscle soreness. S/S of pneumothorax if dry needled over a lung field, and to seek immediate medical attention should they occur. Patient verbalized understanding of these instructions and education. Patient Consent Given: Yes Education handout provided: Yes Muscles treated: bilateral upper traps Electrical stimulation performed: No Parameters: N/A Treatment response/outcome: Skilled palpation used to identify taut bands and trigger points.  Once identified, dry needling techniques used to treat these areas.  Heavy twitch response ellicited in right upper trap > left along with palpable elongation of muscle.  Following treatment, patient reported relief of muscle tension in both upper traps.   Mechanical traction 14 lbs, 2 steps, 100% speed, Hold 60 sec, rest for 10 sec x total of 15 min  DATE: 11/12/2022 UBE x 6 min fwd/back 3/3 3 way scapular stabilization with red loop x 10 each UE 4 D ball rolls x 20 each with yellow plyo ball Trigger Point Dry-Needling  Treatment instructions: Expect mild to moderate muscle soreness. S/S of pneumothorax if dry needled over a lung field, and to seek immediate medical attention should they occur. Patient verbalized understanding of these instructions and education. Patient Consent Given: Yes Education handout provided: Yes Muscles treated: bilateral upper traps Electrical stimulation performed: No Parameters: N/A Treatment  response/outcome: Skilled palpation used to identify taut bands and trigger points.  Once identified, dry needling techniques used to treat these areas.  Twitch response ellicited in right upper trap along with palpable elongation of muscle.  Following treatment, patient reported relief of muscle tension in the right upper trap.  She states the left side felt fine.   Mechanical traction 14 lbs, 2 steps, 100% speed, Hold 60 sec, rest for 10 sec x total of 15 min  DATE: 11/04/2022 UBE x 6 min fwd/back 3/3 3 way scapular stabilization with red loop x 10 each UE 4 D ball rolls x 20 each with yellow plyo ball Wall clocks with right shoulder only with light blue plyo ball x 5  Standing shoulder flexion and scaption x 20 each with 2 lbs (verbal cues for correct technique- patient was doing deltoid raises) Prone drop n catch with light blue plyo ball x 20 each UE Mechanical traction 14 lbs, 2 steps, 100% speed, Hold 60 sec, rest for 10 sec x total of 15 min   PATIENT EDUCATION:  Education details: Initiated HEP Person educated: Patient Education method: Programmer, multimedia, Facilities manager, Verbal cues, and Handouts Education comprehension: verbalized understanding, returned demonstration, and verbal cues required  HOME EXERCISE PROGRAM: Access Code: [email or web address] URL: [email or web address] Date: 09/03/2022 Prepared by: Mikey Kirschner  Exercises - Prone Shoulder Extension - Single Arm  - 2 x daily - 7 x weekly - 2 sets - 10 reps - Prone Shoulder Row  - 2 x daily - 7 x  weekly - 2 sets - 10 reps - Prone Single Arm Shoulder Horizontal Abduction with Scapular Retraction and Palm Down  - 2 x daily - 7 x weekly - 2 sets - 10 reps - Sidelying Shoulder External Rotation  - 2 x daily - 7 x weekly - 2 sets - 10 reps - Single Arm Serratus Punches in Supine with Dumbbell  - 2 x daily - 7 x weekly - 2 sets - 10 reps - Cat Cow  - 1 x daily - 7 x weekly - 2 sets - 10 reps - Standing Shoulder Flexion to 90  Degrees with Dumbbells  - 2 x daily - 7 x weekly - 2 sets - 10 reps - Standing Shoulder Scaption  - 2 x daily - 7 x weekly - 2 sets - 10 reps - Wall Clock with Theraband  - 1 x daily - 7 x weekly - 2 sets - 5 reps - Neck Mobilization with Foam Roller  - 1 x daily - 7 x weekly - 2 sets - 10 reps - Sidelying Open Book Thoracic Rotation with Knee on Foam Roll  - 1 x daily - 7 x weekly - 1-2 sets - 10 reps - Supine Shoulder External Rotation on Foam Roll with Theraband  - 1 x daily - 7 x weekly - 2 sets - 10 reps - Hooklying Scapular Protraction on Foam Roll  - 1 x daily - 7 x weekly - 2 sets - 10 reps - Seated Assisted Cervical Rotation with Towel  - 1 x daily - 7 x weekly - 1 sets - 10 reps - 2-3 sec hold  ASSESSMENT:  CLINICAL IMPRESSION: Emmaline continues to improve and radicular symptoms are centralizing. She is compliant and well motivated.  She should continue to improve.   She would benefit from continuing skilled PT for several visits to assess affects of cervical traction along with her other treatment options.  We may suggest home [facility] unit if patient is having resolution of radicular symptoms.    OBJECTIVE IMPAIRMENTS: decreased ROM, decreased strength, increased fascial restrictions, increased muscle spasms, impaired flexibility, postural dysfunction, and pain.   ACTIVITY LIMITATIONS: carrying, lifting, sleeping, transfers, bed mobility, dressing, reach over head, and hygiene/grooming  PARTICIPATION LIMITATIONS: meal prep, cleaning, laundry, driving, shopping, community activity, occupation, and church  PERSONAL FACTORS: Age and Time since onset of injury/illness/exacerbation are also affecting patient's functional outcome.   REHAB POTENTIAL: Good  CLINICAL DECISION MAKING: Stable/uncomplicated  EVALUATION COMPLEXITY: Low   GOALS: Goals reviewed with patient? Yes  SHORT TERM GOALS: Target date: 08/19/2022   Pain report to be no greater than 4/10  Baseline:  Goal  status: MET on 7/22  2.  Patient will be independent with initial HEP  Baseline:  Goal status: MET on 7/22   LONG TERM GOALS: Target date: 09/16/2022   Patient to report pain no greater than 2/10  Baseline:  Goal status: MET 10/14/22  2.  Patient to be independent with advanced HEP  Baseline:  Goal status: MET 10/14/22  3.  Patient to report 85% improvement in overall symptoms Baseline: 80% reported on 11/04/22 Goal status: Ongoing   4.  Patient to be able to sleep through the night  Baseline:  Goal status: MET 10/14/22  11/04/22 beginning to have some trouble getting comfortable again  5.  FOTO to be 70 Baseline: scored 69 today Goal status: In progress 11/04/22  6.  Patient will be able to reach overhead into cabinets and on  top of shelves without right shoulder or neck pain  Baseline:  Goal status: MET 11/04/22   PLAN:  PT FREQUENCY: 2x/week  PT DURATION: 4 weeks  PLANNED INTERVENTIONS: Therapeutic exercises, Therapeutic activity, Neuromuscular re-education, Balance training, Patient/Family education, Self Care, Joint mobilization, Vestibular training, DME instructions, Aquatic Therapy, Dry Needling, Electrical stimulation, Spinal mobilization, Cryotherapy, Moist heat, scar mobilization, Splintting, Taping, Vasopneumatic device, Traction, Ionotophoresis 4mg /ml Dexamethasone, Manual therapy, and Re-evaluation  PLAN FOR NEXT SESSION: Continue with postural strengthening and traction along with DN as needed.     Victorino Dike B. Sherrill Mck[place]ie, PT 12/04/22 3:20 PM [facility] [address] [place], [place] [address] Phone # [phone] Fax [phone]

## 2022-12-18 ENCOUNTER — Ambulatory Visit: Payer: Medicare Other | Attending: Sports Medicine

## 2022-12-18 DIAGNOSIS — M6281 Muscle weakness (generalized): Secondary | ICD-10-CM | POA: Diagnosis present

## 2022-12-18 DIAGNOSIS — R293 Abnormal posture: Secondary | ICD-10-CM | POA: Diagnosis present

## 2022-12-18 DIAGNOSIS — M542 Cervicalgia: Secondary | ICD-10-CM | POA: Diagnosis not present

## 2022-12-18 DIAGNOSIS — R252 Cramp and spasm: Secondary | ICD-10-CM | POA: Diagnosis present

## 2022-12-18 NOTE — Therapy (Signed)
OUTPATIENT PHYSICAL THERAPY RECERT NOTE  Patient Name: Tammy Weeks MRN: [id number] DOB:October 02, 1955, 67 y.o., female Today's Date: 12/18/2022  END OF SESSION:  PT End of Session - 12/18/22 1523     Visit Number 19    Date for PT Re-Evaluation 12/30/22    Authorization Type MEDICARE PART A AND B    Progress Note Due on Visit 20    PT Start Time 1448    PT Stop Time 1530    PT Time Calculation (min) 42 min    Activity Tolerance Patient tolerated treatment well    Behavior During Therapy WFL for tasks assessed/performed               Past Medical History:  Diagnosis Date   Anxiety    GERD (gastroesophageal reflux disease)    High cholesterol    Hypertension    Past Surgical History:  Procedure Laterality Date   ABDOMINAL HYSTERECTOMY     ADRENAL GLAND SURGERY     CATARACT EXTRACTION     LAPAROSCOPIC APPENDECTOMY N/A 01/29/2022   Procedure: APPENDECTOMY LAPAROSCOPIC;  Surgeon: Axel Filler, MD;  Location: [facility];  Service: General;  Laterality: N/A;   TRIGGER FINGER RELEASE Left 03/05/2021   Procedure: RELEASE TRIGGER FINGER/A-1 PULLEY LEFT RING FINGER;  Surgeon: Cindee Salt, MD;  Location: [facility];  Service: Orthopedics;  Laterality: Left;  45 MIN   Patient Active Problem List   Diagnosis Date Noted   DDD (degenerative disc disease), cervical 07/18/2022   Lumbosacral spondylosis 05/21/2020   Coughing 01/09/2020   Herpes simplex 07/21/2019   Orbital wrinkles 08/19/2018   Primary osteoarthritis of both knees 07/13/2015   Low grade squamous intraepithelial lesion (LGSIL) on cervicovaginal cytologic smear 03/19/2011    PCP: Georgann Housekeeper, MD  REFERRING PROVIDER: Monica Becton, MD  REFERRING DIAG: M50.30 (ICD-10-CM) - DDD (degenerative disc disease), cervical  THERAPY DIAG:  Cervicalgia  Cramp and spasm  Abnormal posture  Muscle weakness (generalized)  Rationale for Evaluation and Treatment: Rehabilitation  ONSET DATE:  07/18/2022  SUBJECTIVE:                                                                                                                                                                                                         SUBJECTIVE STATEMENT: Patient reports she is still doing good.  The radicular symptoms are continuing to resolve.  Pain is still well controlled.    Hand dominance: Right  PERTINENT HISTORY:  Evaluate and Treat. 1-2 times per week for 4-6 weeks. Decrease pain, increase strength,  flexibility, function, and range of motion. Modalities may include, traction, ionto, phono, and stim. May include dry needling with or without stim.  PAIN:  PAIN:  Are you having pain? Yes NPRS scale: 2/10 (over the weekend, I got up to around 4/10) Pain location: cervical and lats PAIN TYPE: aching Pain description: intermittent  Aggravating factors: stress, increased activity Relieving factors: massage   PRECAUTIONS: None  WEIGHT BEARING RESTRICTIONS: No  FALLS:  Has patient fallen in last 6 months? No  LIVING ENVIRONMENT: Lives with: lives with their spouse  OCCUPATION: Hx of sales and traveling  PLOF: Independent, Independent with basic ADLs, Independent with household mobility without device, Independent with community mobility without device, Independent with homemaking with ambulation, Independent with gait, Independent with transfers, Vocation/Vocational requirements: retired , and Leisure: walks, volunteers.   PATIENT GOALS: to eliminate neck and arm pain  NEXT MD VISIT: 09/05/22  OBJECTIVE:   DIAGNOSTIC FINDINGS:  Cervical Radiograph on 07/18/2022: IMPRESSION: 1. Stable lower cervical spondylosis.  No acute bony abnormality.  11/04/22: most recent MRI IMPRESSION: 1. Straightening of cervical lordosis with subtle degenerative anterolisthesis of C3 on C4. No acute osseous abnormality. 2. Moderate C4-C5 disc degeneration with a broad-based posterior component,  mild mass effect on the ventral right spinal cord, but no spinal stenosis or cord signal abnormality. Associated moderate to severe right neural foraminal stenosis, query right C5 radiculitis. 3. Mild to moderate left C6 neural foraminal stenosis. Up to mild right C4 and C6 neural foraminal stenosis.  PATIENT SURVEYS:  Eval:  FOTO 66, goal is 70   09/11/22: FOTO 61, goal is 70   10/07/22: FOTO 65 , goal is 70   11/04/22: FOTO 69, goal is 70  COGNITION: Overall cognitive status: Within functional limits for tasks assessed  SENSATION: WFL  POSTURE:  decreased cervical lordosis  PALPATION: Right upper trap, levator scap and cervical paraspinals with trigger points and tight bands   CERVICAL ROM:   Active ROM A/PROM (deg) eval A/PROM (deg) eval A/PROM (Deg) 10/07/22 A/PROM (Deg) 11/04/22  Flexion wnl WNL WNL WFL  Extension wnl WNL WNL WFL  Right lateral flexion 50% WNL WNL WFL with discomfort  Left lateral flexion 50% WNL WNL WFL  Right rotation wnl WNL WNL WFL  Left rotation wnl WNL WNL WFL   (Blank rows = not tested)  UPPER EXTREMITY ROM:  WFL  UPPER EXTREMITY MMT: Eval: Generally 4 to 4+/5 grossly throughout UE  09/11/22: 5/5 bilateral Ue's without c/o pain  10/07/22:  5/5 bilateral UE no c/o pain  11/04/22: 5/5 bilateral UE no c/o pain  TODAY'S TREATMENT:                                                                                                                              DATE: 12/18/2022 UBE x 6 min fwd/back 3/3 Trigger Point Dry-Needling  Treatment instructions: Expect mild to moderate muscle soreness. S/S of pneumothorax if dry needled over a lung  field, and to seek immediate medical attention should they occur. Patient verbalized understanding of these instructions and education. Patient Consent Given: Yes Education handout provided: Yes Muscles treated: bilateral upper traps Electrical stimulation performed: No Parameters: N/A Treatment  response/outcome: Skilled palpation used to identify taut bands and trigger points.  Once identified, dry needling techniques used to treat these areas.  Heavy twitch response ellicited in both upper traps again today, with palpable elongation of muscle.  Following treatment, patient reported relief of muscle tension in both upper traps.   Mechanical traction 14 lbs, 2 steps, 100% speed, Hold 60 sec, rest for 10 sec x total of 15 min  DATE: 12/04/2022 UBE x 6 min fwd/back 3/3 Trigger Point Dry-Needling  Treatment instructions: Expect mild to moderate muscle soreness. S/S of pneumothorax if dry needled over a lung field, and to seek immediate medical attention should they occur. Patient verbalized understanding of these instructions and education. Patient Consent Given: Yes Education handout provided: Yes Muscles treated: bilateral upper traps Electrical stimulation performed: No Parameters: N/A Treatment response/outcome: Skilled palpation used to identify taut bands and trigger points.  Once identified, dry needling techniques used to treat these areas.  Heavy twitch response ellicited in both upper traps today, with palpable elongation of muscle.  Following treatment, patient reported relief of muscle tension in both upper traps.   Mechanical traction 14 lbs, 2 steps, 100% speed, Hold 60 sec, rest for 10 sec x total of 15 min  DATE: 11/27/2022 UBE x 6 min fwd/back 3/3 Trigger Point Dry-Needling  Treatment instructions: Expect mild to moderate muscle soreness. S/S of pneumothorax if dry needled over a lung field, and to seek immediate medical attention should they occur. Patient verbalized understanding of these instructions and education. Patient Consent Given: Yes Education handout provided: Yes Muscles treated: bilateral upper traps Electrical stimulation performed: No Parameters: N/A Treatment response/outcome: Skilled palpation used to identify taut bands and trigger points.  Once  identified, dry needling techniques used to treat these areas.  Heavy twitch response ellicited in right upper trap > left along with palpable elongation of muscle.  Following treatment, patient reported relief of muscle tension in both upper traps.   Mechanical traction 14 lbs, 2 steps, 100% speed, Hold 60 sec, rest for 10 sec x total of 15 min  DATE: 11/12/2022 UBE x 6 min fwd/back 3/3 3 way scapular stabilization with red loop x 10 each UE 4 D ball rolls x 20 each with yellow plyo ball Trigger Point Dry-Needling  Treatment instructions: Expect mild to moderate muscle soreness. S/S of pneumothorax if dry needled over a lung field, and to seek immediate medical attention should they occur. Patient verbalized understanding of these instructions and education. Patient Consent Given: Yes Education handout provided: Yes Muscles treated: bilateral upper traps Electrical stimulation performed: No Parameters: N/A Treatment response/outcome: Skilled palpation used to identify taut bands and trigger points.  Once identified, dry needling techniques used to treat these areas.  Twitch response ellicited in right upper trap along with palpable elongation of muscle.  Following treatment, patient reported relief of muscle tension in the right upper trap.  She states the left side felt fine.   Mechanical traction 14 lbs, 2 steps, 100% speed, Hold 60 sec, rest for 10 sec x total of 15 min  DATE: 11/04/2022 UBE x 6 min fwd/back 3/3 3 way scapular stabilization with red loop x 10 each UE 4 D ball rolls x 20 each with yellow plyo ball Wall clocks with right  shoulder only with light blue plyo ball x 5  Standing shoulder flexion and scaption x 20 each with 2 lbs (verbal cues for correct technique- patient was doing deltoid raises) Prone drop n catch with light blue plyo ball x 20 each UE Mechanical traction 14 lbs, 2 steps, 100% speed, Hold 60 sec, rest for 10 sec x total of 15 min   PATIENT EDUCATION:   Education details: Initiated HEP Person educated: Patient Education method: Programmer, multimedia, Facilities manager, Verbal cues, and Handouts Education comprehension: verbalized understanding, returned demonstration, and verbal cues required  HOME EXERCISE PROGRAM: Access Code: [email or web address] URL: [email or web address] Date: 09/03/2022 Prepared by: Mikey Kirschner  Exercises - Prone Shoulder Extension - Single Arm  - 2 x daily - 7 x weekly - 2 sets - 10 reps - Prone Shoulder Row  - 2 x daily - 7 x weekly - 2 sets - 10 reps - Prone Single Arm Shoulder Horizontal Abduction with Scapular Retraction and Palm Down  - 2 x daily - 7 x weekly - 2 sets - 10 reps - Sidelying Shoulder External Rotation  - 2 x daily - 7 x weekly - 2 sets - 10 reps - Single Arm Serratus Punches in Supine with Dumbbell  - 2 x daily - 7 x weekly - 2 sets - 10 reps - Cat Cow  - 1 x daily - 7 x weekly - 2 sets - 10 reps - Standing Shoulder Flexion to 90 Degrees with Dumbbells  - 2 x daily - 7 x weekly - 2 sets - 10 reps - Standing Shoulder Scaption  - 2 x daily - 7 x weekly - 2 sets - 10 reps - Wall Clock with Theraband  - 1 x daily - 7 x weekly - 2 sets - 5 reps - Neck Mobilization with Foam Roller  - 1 x daily - 7 x weekly - 2 sets - 10 reps - Sidelying Open Book Thoracic Rotation with Knee on Foam Roll  - 1 x daily - 7 x weekly - 1-2 sets - 10 reps - Supine Shoulder External Rotation on Foam Roll with Theraband  - 1 x daily - 7 x weekly - 2 sets - 10 reps - Hooklying Scapular Protraction on Foam Roll  - 1 x daily - 7 x weekly - 2 sets - 10 reps - Seated Assisted Cervical Rotation with Towel  - 1 x daily - 7 x weekly - 1 sets - 10 reps - 2-3 sec hold  ASSESSMENT:  CLINICAL IMPRESSION: Suzi continues to respond well to the combination of postural strengthening, DN and traction.  Home [facility] unit if patient is having resolution of radicular symptoms.    OBJECTIVE IMPAIRMENTS: decreased ROM, decreased strength,  increased fascial restrictions, increased muscle spasms, impaired flexibility, postural dysfunction, and pain.   ACTIVITY LIMITATIONS: carrying, lifting, sleeping, transfers, bed mobility, dressing, reach over head, and hygiene/grooming  PARTICIPATION LIMITATIONS: meal prep, cleaning, laundry, driving, shopping, community activity, occupation, and church  PERSONAL FACTORS: Age and Time since onset of injury/illness/exacerbation are also affecting patient's functional outcome.   REHAB POTENTIAL: Good  CLINICAL DECISION MAKING: Stable/uncomplicated  EVALUATION COMPLEXITY: Low   GOALS: Goals reviewed with patient? Yes  SHORT TERM GOALS: Target date: 08/19/2022   Pain report to be no greater than 4/10  Baseline:  Goal status: MET on 7/22  2.  Patient will be independent with initial HEP  Baseline:  Goal status: MET on 7/22   LONG TERM GOALS: Target date: 09/16/2022  Patient to report pain no greater than 2/10  Baseline:  Goal status: MET 10/14/22  2.  Patient to be independent with advanced HEP  Baseline:  Goal status: MET 10/14/22  3.  Patient to report 85% improvement in overall symptoms Baseline: 80% reported on 11/04/22 Goal status: Ongoing   4.  Patient to be able to sleep through the night  Baseline:  Goal status: MET 10/14/22  11/04/22 beginning to have some trouble getting comfortable again  5.  FOTO to be 70 Baseline: scored 69 today Goal status: In progress 11/04/22  6.  Patient will be able to reach overhead into cabinets and on top of shelves without right shoulder or neck pain  Baseline:  Goal status: MET 11/04/22   PLAN:  PT FREQUENCY: 2x/week  PT DURATION: 4 weeks  PLANNED INTERVENTIONS: Therapeutic exercises, Therapeutic activity, Neuromuscular re-education, Balance training, Patient/Family education, Self Care, Joint mobilization, Vestibular training, DME instructions, Aquatic Therapy, Dry Needling, Electrical stimulation, Spinal mobilization,  Cryotherapy, Moist heat, scar mobilization, Splintting, Taping, Vasopneumatic device, Traction, Ionotophoresis 4mg /ml Dexamethasone, Manual therapy, and Re-evaluation  PLAN FOR NEXT SESSION: Patient has 2 more visits.  Continue with postural strengthening and traction along with DN as needed.     Victorino Dike B. Lance [place], PT 12/18/22 3:29 PM [facility] [address], [address] [place], [place] [address] Phone # [phone] Fax [phone]

## 2022-12-23 DIAGNOSIS — L814 Other melanin hyperpigmentation: Secondary | ICD-10-CM | POA: Diagnosis not present

## 2022-12-23 DIAGNOSIS — Z85828 Personal history of other malignant neoplasm of skin: Secondary | ICD-10-CM | POA: Diagnosis not present

## 2022-12-23 DIAGNOSIS — D2372 Other benign neoplasm of skin of left lower limb, including hip: Secondary | ICD-10-CM | POA: Diagnosis not present

## 2022-12-23 DIAGNOSIS — L821 Other seborrheic keratosis: Secondary | ICD-10-CM | POA: Diagnosis not present

## 2022-12-23 DIAGNOSIS — D1801 Hemangioma of skin and subcutaneous tissue: Secondary | ICD-10-CM | POA: Diagnosis not present

## 2022-12-23 DIAGNOSIS — D225 Melanocytic nevi of trunk: Secondary | ICD-10-CM | POA: Diagnosis not present

## 2022-12-23 DIAGNOSIS — L570 Actinic keratosis: Secondary | ICD-10-CM | POA: Diagnosis not present

## 2022-12-24 ENCOUNTER — Ambulatory Visit: Payer: Medicare Other

## 2022-12-24 DIAGNOSIS — R293 Abnormal posture: Secondary | ICD-10-CM | POA: Diagnosis not present

## 2022-12-24 DIAGNOSIS — M542 Cervicalgia: Secondary | ICD-10-CM

## 2022-12-24 DIAGNOSIS — R252 Cramp and spasm: Secondary | ICD-10-CM

## 2022-12-24 DIAGNOSIS — M6281 Muscle weakness (generalized): Secondary | ICD-10-CM

## 2022-12-24 NOTE — Therapy (Signed)
OUTPATIENT PHYSICAL THERAPY RECERT NOTE  Patient Name: Tammy Weeks MRN: [id number] DOB:[birth year], 67 y.o., female Today's Date: 12/24/2022  END OF SESSION:  PT End of Session - 12/24/22 0759     Visit Number 20    Date for PT Re-Evaluation 12/30/22    Authorization Type MEDICARE PART A AND B    Progress Note Due on Visit 20    PT Start Time 0800    PT Stop Time 0840    PT Time Calculation (min) 40 min    Activity Tolerance Patient tolerated treatment well    Behavior During Therapy WFL for tasks assessed/performed               Past Medical History:  Diagnosis Date   Anxiety    GERD (gastroesophageal reflux disease)    High cholesterol    Hypertension    Past Surgical History:  Procedure Laterality Date   ABDOMINAL HYSTERECTOMY     ADRENAL GLAND SURGERY     CATARACT EXTRACTION     LAPAROSCOPIC APPENDECTOMY N/A 01/29/2022   Procedure: APPENDECTOMY LAPAROSCOPIC;  Surgeon: Axel Filler, MD;  Location: [facility];  Service: General;  Laterality: N/A;   TRIGGER FINGER RELEASE Left 03/05/2021   Procedure: RELEASE TRIGGER FINGER/A-1 PULLEY LEFT RING FINGER;  Surgeon: Cindee Salt, MD;  Location: [facility];  Service: Orthopedics;  Laterality: Left;  45 MIN   Patient Active Problem List   Diagnosis Date Noted   DDD (degenerative disc disease), cervical 07/18/2022   Lumbosacral spondylosis 05/21/2020   Coughing 01/09/2020   Herpes simplex 07/21/2019   Orbital wrinkles 08/19/2018   Primary osteoarthritis of both knees 07/13/2015   Low grade squamous intraepithelial lesion (LGSIL) on cervicovaginal cytologic smear 03/19/2011    PCP: Georgann Housekeeper, MD  REFERRING PROVIDER: Monica Becton, MD  REFERRING DIAG: M50.30 (ICD-10-CM) - DDD (degenerative disc disease), cervical  THERAPY DIAG:  Cervicalgia  Cramp and spasm  Abnormal posture  Muscle weakness (generalized)  Rationale for Evaluation and Treatment: Rehabilitation  ONSET DATE:  07/18/2022  SUBJECTIVE:                                                                                                                                                                                                         SUBJECTIVE STATEMENT: Patient reports she is still doing good.  The radicular symptoms are continuing to resolve.  Pain is still well controlled.    Hand dominance: Right  PERTINENT HISTORY:  Evaluate and Treat. 1-2 times per week for 4-6 weeks. Decrease pain, increase strength,  flexibility, function, and range of motion. Modalities may include, traction, ionto, phono, and stim. May include dry needling with or without stim.  PAIN:  PAIN:  Are you having pain? Yes NPRS scale: 2/10 (over the weekend, I got up to around 4/10) Pain location: cervical and lats PAIN TYPE: aching Pain description: intermittent  Aggravating factors: stress, increased activity Relieving factors: massage   PRECAUTIONS: None  WEIGHT BEARING RESTRICTIONS: No  FALLS:  Has patient fallen in last 6 months? No  LIVING ENVIRONMENT: Lives with: lives with their spouse  OCCUPATION: Hx of sales and traveling  PLOF: Independent, Independent with basic ADLs, Independent with household mobility without device, Independent with community mobility without device, Independent with homemaking with ambulation, Independent with gait, Independent with transfers, Vocation/Vocational requirements: retired , and Leisure: walks, volunteers.   PATIENT GOALS: to eliminate neck and arm pain  NEXT MD VISIT: 09/05/22  OBJECTIVE:   DIAGNOSTIC FINDINGS:  Cervical Radiograph on 07/18/2022: IMPRESSION: 1. Stable lower cervical spondylosis.  No acute bony abnormality.  11/04/22: most recent MRI IMPRESSION: 1. Straightening of cervical lordosis with subtle degenerative anterolisthesis of C3 on C4. No acute osseous abnormality. 2. Moderate C4-C5 disc degeneration with a broad-based posterior component,  mild mass effect on the ventral right spinal cord, but no spinal stenosis or cord signal abnormality. Associated moderate to severe right neural foraminal stenosis, query right C5 radiculitis. 3. Mild to moderate left C6 neural foraminal stenosis. Up to mild right C4 and C6 neural foraminal stenosis.  PATIENT SURVEYS:  Eval:  FOTO 66, goal is 70   09/11/22: FOTO 61, goal is 70   10/07/22: FOTO 65 , goal is 70   11/04/22: FOTO 69, goal is 70  COGNITION: Overall cognitive status: Within functional limits for tasks assessed  SENSATION: WFL  POSTURE:  decreased cervical lordosis  PALPATION: Right upper trap, levator scap and cervical paraspinals with trigger points and tight bands   CERVICAL ROM:   Active ROM A/PROM (deg) eval A/PROM (deg) eval A/PROM (Deg) 10/07/22 A/PROM (Deg) 11/04/22  Flexion wnl WNL WNL WFL  Extension wnl WNL WNL WFL  Right lateral flexion 50% WNL WNL WFL with discomfort  Left lateral flexion 50% WNL WNL WFL  Right rotation wnl WNL WNL WFL  Left rotation wnl WNL WNL WFL   (Blank rows = not tested)  UPPER EXTREMITY ROM:  WFL  UPPER EXTREMITY MMT: Eval: Generally 4 to 4+/5 grossly throughout UE  09/11/22: 5/5 bilateral Ue's without c/o pain  10/07/22:  5/5 bilateral UE no c/o pain  11/04/22: 5/5 bilateral UE no c/o pain  TODAY'S TREATMENT:                                                                                                                              DATE: 12/24/2022 UBE x 6 min fwd/back 3/3 Trigger Point Dry-Needling  Treatment instructions: Expect mild to moderate muscle soreness. S/S of pneumothorax if dry needled over a lung  field, and to seek immediate medical attention should they occur. Patient verbalized understanding of these instructions and education. Patient Consent Given: Yes Education handout provided: Yes Muscles treated: bilateral upper traps Electrical stimulation performed: No Parameters: N/A Treatment  response/outcome: Skilled palpation used to identify taut bands and trigger points.  Once identified, dry needling techniques used to treat these areas.  Heavy twitch response ellicited in both upper traps again today, with palpable elongation of muscle.  Following treatment, patient reported relief of muscle tension in both upper traps.   Mechanical traction 14 lbs, 2 steps, 100% speed, Hold 60 sec, rest for 10 sec x total of 15 min  DATE: 12/18/2022 UBE x 6 min fwd/back 3/3 Trigger Point Dry-Needling  Treatment instructions: Expect mild to moderate muscle soreness. S/S of pneumothorax if dry needled over a lung field, and to seek immediate medical attention should they occur. Patient verbalized understanding of these instructions and education. Patient Consent Given: Yes Education handout provided: Yes Muscles treated: bilateral upper traps Electrical stimulation performed: No Parameters: N/A Treatment response/outcome: Skilled palpation used to identify taut bands and trigger points.  Once identified, dry needling techniques used to treat these areas.  Heavy twitch response ellicited in both upper traps again today, with palpable elongation of muscle.  Following treatment, patient reported relief of muscle tension in both upper traps.   Mechanical traction 14 lbs, 2 steps, 100% speed, Hold 60 sec, rest for 10 sec x total of 15 min  DATE: 12/04/2022 UBE x 6 min fwd/back 3/3 Trigger Point Dry-Needling  Treatment instructions: Expect mild to moderate muscle soreness. S/S of pneumothorax if dry needled over a lung field, and to seek immediate medical attention should they occur. Patient verbalized understanding of these instructions and education. Patient Consent Given: Yes Education handout provided: Yes Muscles treated: bilateral upper traps Electrical stimulation performed: No Parameters: N/A Treatment response/outcome: Skilled palpation used to identify taut bands and trigger points.  Once  identified, dry needling techniques used to treat these areas.  Heavy twitch response ellicited in both upper traps today, with palpable elongation of muscle.  Following treatment, patient reported relief of muscle tension in both upper traps.   Mechanical traction 14 lbs, 2 steps, 100% speed, Hold 60 sec, rest for 10 sec x total of 15 min    PATIENT EDUCATION:  Education details: Initiated HEP Person educated: Patient Education method: Programmer, multimedia, Facilities manager, Verbal cues, and Handouts Education comprehension: verbalized understanding, returned demonstration, and verbal cues required  HOME EXERCISE PROGRAM: Access Code: [email or web address] URL: [email or web address] Date: 09/03/2022 Prepared by: Mikey Kirschner  Exercises - Prone Shoulder Extension - Single Arm  - 2 x daily - 7 x weekly - 2 sets - 10 reps - Prone Shoulder Row  - 2 x daily - 7 x weekly - 2 sets - 10 reps - Prone Single Arm Shoulder Horizontal Abduction with Scapular Retraction and Palm Down  - 2 x daily - 7 x weekly - 2 sets - 10 reps - Sidelying Shoulder External Rotation  - 2 x daily - 7 x weekly - 2 sets - 10 reps - Single Arm Serratus Punches in Supine with Dumbbell  - 2 x daily - 7 x weekly - 2 sets - 10 reps - Cat Cow  - 1 x daily - 7 x weekly - 2 sets - 10 reps - Standing Shoulder Flexion to 90 Degrees with Dumbbells  - 2 x daily - 7 x weekly - 2 sets - 10 reps - Standing Shoulder  Scaption  - 2 x daily - 7 x weekly - 2 sets - 10 reps - Wall Clock with Theraband  - 1 x daily - 7 x weekly - 2 sets - 5 reps - Neck Mobilization with Foam Roller  - 1 x daily - 7 x weekly - 2 sets - 10 reps - Sidelying Open Book Thoracic Rotation with Knee on Foam Roll  - 1 x daily - 7 x weekly - 1-2 sets - 10 reps - Supine Shoulder External Rotation on Foam Roll with Theraband  - 1 x daily - 7 x weekly - 2 sets - 10 reps - Hooklying Scapular Protraction on Foam Roll  - 1 x daily - 7 x weekly - 2 sets - 10 reps - Seated Assisted  Cervical Rotation with Towel  - 1 x daily - 7 x weekly - 1 sets - 10 reps - 2-3 sec hold  ASSESSMENT:  CLINICAL IMPRESSION: [place] continues to respond well to the combination of postural strengthening, DN and traction.  Patient given information about [facility] home traction unit.  She will be purchasing as the traction has been very effective.  She has one visit left.  We will DC next visit.      OBJECTIVE IMPAIRMENTS: decreased ROM, decreased strength, increased fascial restrictions, increased muscle spasms, impaired flexibility, postural dysfunction, and pain.   ACTIVITY LIMITATIONS: carrying, lifting, sleeping, transfers, bed mobility, dressing, reach over head, and hygiene/grooming  PARTICIPATION LIMITATIONS: meal prep, cleaning, laundry, driving, shopping, community activity, occupation, and church  PERSONAL FACTORS: Age and Time since onset of injury/illness/exacerbation are also affecting patient's functional outcome.   REHAB POTENTIAL: Good  CLINICAL DECISION MAKING: Stable/uncomplicated  EVALUATION COMPLEXITY: Low   GOALS: Goals reviewed with patient? Yes  SHORT TERM GOALS: Target date: 08/19/2022   Pain report to be no greater than 4/10  Baseline:  Goal status: MET on 7/22  2.  Patient will be independent with initial HEP  Baseline:  Goal status: MET on 7/22   LONG TERM GOALS: Target date: 09/16/2022   Patient to report pain no greater than 2/10  Baseline:  Goal status: MET 10/14/22  2.  Patient to be independent with advanced HEP  Baseline:  Goal status: MET 10/14/22  3.  Patient to report 85% improvement in overall symptoms Baseline: 80% reported on 11/04/22 Goal status: Ongoing   4.  Patient to be able to sleep through the night  Baseline:  Goal status: MET 10/14/22  11/04/22 beginning to have some trouble getting comfortable again  5.  FOTO to be 70 Baseline: scored 69 today Goal status: In progress 11/04/22  6.  Patient will be able to reach  overhead into cabinets and on top of shelves without right shoulder or neck pain  Baseline:  Goal status: MET 11/04/22   PLAN:  PT FREQUENCY: 2x/week  PT DURATION: 4 weeks  PLANNED INTERVENTIONS: Therapeutic exercises, Therapeutic activity, Neuromuscular re-education, Balance training, Patient/Family education, Self Care, Joint mobilization, Vestibular training, DME instructions, Aquatic Therapy, Dry Needling, Electrical stimulation, Spinal mobilization, Cryotherapy, Moist heat, scar mobilization, Splintting, Taping, Vasopneumatic device, Traction, Ionotophoresis 4mg /ml Dexamethasone, Manual therapy, and Re-evaluation  PLAN FOR NEXT SESSION: Patient has 1 more visit.  DC plan next visit. Continue with postural strengthening and traction along with DN as needed.     Victorino Dike B. Dorsey Authement, PT 12/24/22 8:41 AM [facility] [address] [place], [place] [address] Phone # [phone] Fax [phone]

## 2022-12-29 DIAGNOSIS — H10411 Chronic giant papillary conjunctivitis, right eye: Secondary | ICD-10-CM | POA: Diagnosis not present

## 2022-12-30 ENCOUNTER — Ambulatory Visit: Payer: Medicare Other

## 2022-12-30 DIAGNOSIS — R293 Abnormal posture: Secondary | ICD-10-CM | POA: Diagnosis not present

## 2022-12-30 DIAGNOSIS — R252 Cramp and spasm: Secondary | ICD-10-CM

## 2022-12-30 DIAGNOSIS — M6281 Muscle weakness (generalized): Secondary | ICD-10-CM

## 2022-12-30 DIAGNOSIS — M542 Cervicalgia: Secondary | ICD-10-CM

## 2022-12-30 NOTE — Therapy (Signed)
OUTPATIENT PHYSICAL THERAPY DISCHARGE PHYSICAL THERAPY DISCHARGE SUMMARY  Visits from Start of Care: 21  Current functional level related to goals / functional outcomes: See below   Remaining deficits: See below   Education / Equipment: See below   Patient agrees to discharge. Patient goals were met. Patient is being discharged due to meeting the stated rehab goals.   Patient Name: Tammy Weeks MRN: [id number] DOB:1955/09/16, 67 y.o., female Today's Date: 12/30/2022  END OF SESSION:  PT End of Session - 12/30/22 1142     Visit Number 21    Date for PT Re-Evaluation 12/30/22    Authorization Type MEDICARE PART A AND B    Progress Note Due on Visit 20    PT Start Time 1143    PT Stop Time 1230    PT Time Calculation (min) 47 min    Activity Tolerance Patient tolerated treatment well    Behavior During Therapy WFL for tasks assessed/performed               Past Medical History:  Diagnosis Date   Anxiety    GERD (gastroesophageal reflux disease)    High cholesterol    Hypertension    Past Surgical History:  Procedure Laterality Date   ABDOMINAL HYSTERECTOMY     ADRENAL GLAND SURGERY     CATARACT EXTRACTION     LAPAROSCOPIC APPENDECTOMY N/A 01/29/2022   Procedure: APPENDECTOMY LAPAROSCOPIC;  Surgeon: Axel Filler, MD;  Location: [facility];  Service: General;  Laterality: N/A;   TRIGGER FINGER RELEASE Left 03/05/2021   Procedure: RELEASE TRIGGER FINGER/A-1 PULLEY LEFT RING FINGER;  Surgeon: Cindee Salt, MD;  Location: [facility];  Service: Orthopedics;  Laterality: Left;  45 MIN   Patient Active Problem List   Diagnosis Date Noted   DDD (degenerative disc disease), cervical 07/18/2022   Lumbosacral spondylosis 05/21/2020   Coughing 01/09/2020   Herpes simplex 07/21/2019   Orbital wrinkles 08/19/2018   Primary osteoarthritis of both knees 07/13/2015   Low grade squamous intraepithelial lesion (LGSIL) on cervicovaginal cytologic smear  03/19/2011    PCP: Georgann Housekeeper, MD  REFERRING PROVIDER: Monica Becton, MD  REFERRING DIAG: M50.30 (ICD-10-CM) - DDD (degenerative disc disease), cervical  THERAPY DIAG:  Cervicalgia  Cramp and spasm  Abnormal posture  Muscle weakness (generalized)  Rationale for Evaluation and Treatment: Rehabilitation  ONSET DATE: 07/18/2022  SUBJECTIVE:  SUBJECTIVE STATEMENT: Patient reports she is still doing good.  The radicular symptoms are continuing to resolve.  She has access to a [place] home traction unit.     Hand dominance: Right  PERTINENT HISTORY:  Evaluate and Treat. 1-2 times per week for 4-6 weeks. Decrease pain, increase strength, flexibility, function, and range of motion. Modalities may include, traction, ionto, phono, and stim. May include dry needling with or without stim.  PAIN:  PAIN:  Are you having pain? No NPRS scale: 0/10  Pain location: cervical and lats PAIN TYPE: aching Pain description: intermittent  Aggravating factors: stress, increased activity Relieving factors: massage   PRECAUTIONS: None  WEIGHT BEARING RESTRICTIONS: No  FALLS:  Has patient fallen in last 6 months? No  LIVING ENVIRONMENT: Lives with: lives with their spouse  OCCUPATION: Hx of sales and traveling  PLOF: Independent, Independent with basic ADLs, Independent with household mobility without device, Independent with community mobility without device, Independent with homemaking with ambulation, Independent with gait, Independent with transfers, Vocation/Vocational requirements: retired , and Leisure: walks, volunteers.   PATIENT GOALS: to eliminate neck and arm pain  NEXT MD VISIT: 09/05/22  OBJECTIVE:   DIAGNOSTIC FINDINGS:  Cervical Radiograph on  07/18/2022: IMPRESSION: 1. Stable lower cervical spondylosis.  No acute bony abnormality.  11/04/22: most recent MRI IMPRESSION: 1. Straightening of cervical lordosis with subtle degenerative anterolisthesis of C3 on C4. No acute osseous abnormality. 2. Moderate C4-C5 disc degeneration with a broad-based posterior component, mild mass effect on the ventral right spinal cord, but no spinal stenosis or cord signal abnormality. Associated moderate to severe right neural foraminal stenosis, query right C5 radiculitis. 3. Mild to moderate left C6 neural foraminal stenosis. Up to mild right C4 and C6 neural foraminal stenosis.  PATIENT SURVEYS:  Eval:  FOTO 66, goal is 70   09/11/22: FOTO 61, goal is 70   10/07/22: FOTO 65 , goal is 70   11/04/22: FOTO 69, goal is 70    12/30/2022: FOTO 70, MET  COGNITION: Overall cognitive status: Within functional limits for tasks assessed  SENSATION: WFL  POSTURE:  decreased cervical lordosis  PALPATION: Right upper trap, levator scap and cervical paraspinals with trigger points and tight bands   CERVICAL ROM:   Active ROM A/PROM (deg) eval A/PROM (deg) eval A/PROM (Deg) 10/07/22 A/PROM (Deg) 11/04/22 A/PROM (Deg) 12/30/22  Flexion wnl WNL WNL WFL WNL  Extension wnl WNL WNL WFL WNL  Right lateral flexion 50% WNL WNL WFL with discomfort WNL  Left lateral flexion 50% WNL WNL WFL WNL  Right rotation wnl WNL WNL WFL WNL  Left rotation wnl WNL WNL WFL WNL   (Blank rows = not tested)  UPPER EXTREMITY ROM:  WFL  UPPER EXTREMITY MMT: Eval: Generally 4 to 4+/5 grossly throughout UE  09/11/22: 5/5 bilateral Ue's without c/o pain  10/07/22:  5/5 bilateral UE no c/o pain  11/04/22: 5/5 bilateral UE no c/o pain  TODAY'S TREATMENT:  DATE: 12/30/2022 UBE x 6 min fwd/back 3/3 DC assessment Trigger Point Dry-Needling   Treatment instructions: Expect mild to moderate muscle soreness. S/S of pneumothorax if dry needled over a lung field, and to seek immediate medical attention should they occur. Patient verbalized understanding of these instructions and education. Patient Consent Given: Yes Education handout provided: Yes Muscles treated: bilateral upper traps Electrical stimulation performed: No Parameters: N/A Treatment response/outcome: Skilled palpation used to identify taut bands and trigger points.  Once identified, dry needling techniques used to treat these areas.  Heavy twitch response ellicited in both upper traps again today, with palpable elongation of muscle.  Following treatment, patient reported relief of muscle tension in both upper traps.   Mechanical traction 16 lbs, 2 steps, 100% speed, Hold 60 sec, rest for 10 sec x total of 15 min  DATE: 12/24/2022 UBE x 6 min fwd/back 3/3 Trigger Point Dry-Needling  Treatment instructions: Expect mild to moderate muscle soreness. S/S of pneumothorax if dry needled over a lung field, and to seek immediate medical attention should they occur. Patient verbalized understanding of these instructions and education. Patient Consent Given: Yes Education handout provided: Yes Muscles treated: bilateral upper traps Electrical stimulation performed: No Parameters: N/A Treatment response/outcome: Skilled palpation used to identify taut bands and trigger points.  Once identified, dry needling techniques used to treat these areas.  Heavy twitch response ellicited in both upper traps again today, with palpable elongation of muscle.  Following treatment, patient reported relief of muscle tension in both upper traps.   Mechanical traction 14 lbs, 2 steps, 100% speed, Hold 60 sec, rest for 10 sec x total of 15 min  DATE: 12/18/2022 UBE x 6 min fwd/back 3/3 Trigger Point Dry-Needling  Treatment instructions: Expect mild to moderate muscle soreness. S/S of pneumothorax if  dry needled over a lung field, and to seek immediate medical attention should they occur. Patient verbalized understanding of these instructions and education. Patient Consent Given: Yes Education handout provided: Yes Muscles treated: bilateral upper traps Electrical stimulation performed: No Parameters: N/A Treatment response/outcome: Skilled palpation used to identify taut bands and trigger points.  Once identified, dry needling techniques used to treat these areas.  Heavy twitch response ellicited in both upper traps again today, with palpable elongation of muscle.  Following treatment, patient reported relief of muscle tension in both upper traps.   Mechanical traction 14 lbs, 2 steps, 100% speed, Hold 60 sec, rest for 10 sec x total of 15 min  PATIENT EDUCATION:  Education details: Initiated HEP Person educated: Patient Education method: Programmer, multimedia, Facilities manager, Verbal cues, and Handouts Education comprehension: verbalized understanding, returned demonstration, and verbal cues required  HOME EXERCISE PROGRAM: Access Code: [email or web address] URL: [email or web address] Date: 09/03/2022 Prepared by: Mikey Kirschner  Exercises - Prone Shoulder Extension - Single Arm  - 2 x daily - 7 x weekly - 2 sets - 10 reps - Prone Shoulder Row  - 2 x daily - 7 x weekly - 2 sets - 10 reps - Prone Single Arm Shoulder Horizontal Abduction with Scapular Retraction and Palm Down  - 2 x daily - 7 x weekly - 2 sets - 10 reps - Sidelying Shoulder External Rotation  - 2 x daily - 7 x weekly - 2 sets - 10 reps - Single Arm Serratus Punches in Supine with Dumbbell  - 2 x daily - 7 x weekly - 2 sets - 10 reps - Cat Cow  - 1 x daily - 7 x weekly - 2 sets -  10 reps - Standing Shoulder Flexion to 90 Degrees with Dumbbells  - 2 x daily - 7 x weekly - 2 sets - 10 reps - Standing Shoulder Scaption  - 2 x daily - 7 x weekly - 2 sets - 10 reps - Wall Clock with Theraband  - 1 x daily - 7 x weekly - 2 sets - 5  reps - Neck Mobilization with Foam Roller  - 1 x daily - 7 x weekly - 2 sets - 10 reps - Sidelying Open Book Thoracic Rotation with Knee on Foam Roll  - 1 x daily - 7 x weekly - 1-2 sets - 10 reps - Supine Shoulder External Rotation on Foam Roll with Theraband  - 1 x daily - 7 x weekly - 2 sets - 10 reps - Hooklying Scapular Protraction on Foam Roll  - 1 x daily - 7 x weekly - 2 sets - 10 reps - Seated Assisted Cervical Rotation with Towel  - 1 x daily - 7 x weekly - 1 sets - 10 reps - 2-3 sec hold  ASSESSMENT:  CLINICAL IMPRESSION: Tamla has met all goals.  She has occasional pain and very minimal radicular symptoms.  She will be accessing a [place] home traction unit.  She is managing her pain well with her HEP.  She should continue to do well.  We will DC at this time.       OBJECTIVE IMPAIRMENTS: decreased ROM, decreased strength, increased fascial restrictions, increased muscle spasms, impaired flexibility, postural dysfunction, and pain.   ACTIVITY LIMITATIONS: carrying, lifting, sleeping, transfers, bed mobility, dressing, reach over head, and hygiene/grooming  PARTICIPATION LIMITATIONS: meal prep, cleaning, laundry, driving, shopping, community activity, occupation, and church  PERSONAL FACTORS: Age and Time since onset of injury/illness/exacerbation are also affecting patient's functional outcome.   REHAB POTENTIAL: Good  CLINICAL DECISION MAKING: Stable/uncomplicated  EVALUATION COMPLEXITY: Low   GOALS: Goals reviewed with patient? Yes  SHORT TERM GOALS: Target date: 08/19/2022   Pain report to be no greater than 4/10  Baseline:  Goal status: MET on 7/22  2.  Patient will be independent with initial HEP  Baseline:  Goal status: MET on 7/22   LONG TERM GOALS: Target date: 09/16/2022   Patient to report pain no greater than 2/10  Baseline:  Goal status: MET 10/14/22  2.  Patient to be independent with advanced HEP  Baseline:  Goal status: MET 10/14/22  3.   Patient to report 85% improvement in overall symptoms Baseline: MET 85% Goal status: MET  4.  Patient to be able to sleep through the night  Baseline:  Goal status: MET 10/14/22  11/04/22 beginning to have some trouble getting comfortable  MET again 12/30/22  5.  FOTO to be 70 Baseline: scored 69 today Goal status: In progress 11/04/22, MET 12/30/22  6.  Patient will be able to reach overhead into cabinets and on top of shelves without right shoulder or neck pain  Baseline:  Goal status: MET 11/04/22   PLAN:  PT FREQUENCY: 2x/week  PT DURATION: 4 weeks  PLANNED INTERVENTIONS: Therapeutic exercises, Therapeutic activity, Neuromuscular re-education, Balance training, Patient/Family education, Self Care, Joint mobilization, Vestibular training, DME instructions, Aquatic Therapy, Dry Needling, Electrical stimulation, Spinal mobilization, Cryotherapy, Moist heat, scar mobilization, Splintting, Taping, Vasopneumatic device, Traction, Ionotophoresis 4mg /ml Dexamethasone, Manual therapy, and Re-evaluation  PLAN FOR NEXT SESSION: We will DC at this time.     Victorino Dike B. Kavin Weckwerth, PT 12/30/22 2:00 PM [facility] [phone]  [address] [place], [place] [address] Phone # [phone] Fax [phone]

## 2023-02-04 ENCOUNTER — Other Ambulatory Visit: Payer: Self-pay

## 2023-02-04 ENCOUNTER — Emergency Department (HOSPITAL_BASED_OUTPATIENT_CLINIC_OR_DEPARTMENT_OTHER)
Admission: EM | Admit: 2023-02-04 | Discharge: 2023-02-04 | Disposition: A | Discharge status: Home / Self Care | Payer: Medicare Other | Attending: Emergency Medicine | Admitting: Emergency Medicine

## 2023-02-04 ENCOUNTER — Emergency Department (HOSPITAL_BASED_OUTPATIENT_CLINIC_OR_DEPARTMENT_OTHER): Payer: Medicare Other

## 2023-02-04 ENCOUNTER — Encounter (HOSPITAL_BASED_OUTPATIENT_CLINIC_OR_DEPARTMENT_OTHER): Payer: Self-pay

## 2023-02-04 DIAGNOSIS — R0789 Other chest pain: Secondary | ICD-10-CM | POA: Insufficient documentation

## 2023-02-04 DIAGNOSIS — I10 Essential (primary) hypertension: Secondary | ICD-10-CM | POA: Insufficient documentation

## 2023-02-04 DIAGNOSIS — Z79899 Other long term (current) drug therapy: Secondary | ICD-10-CM | POA: Insufficient documentation

## 2023-02-04 DIAGNOSIS — R079 Chest pain, unspecified: Secondary | ICD-10-CM | POA: Diagnosis not present

## 2023-02-04 LAB — COMPREHENSIVE METABOLIC PANEL
ALT: 17 U/L (ref 0–44)
AST: 19 U/L (ref 15–41)
Albumin: 4.5 g/dL (ref 3.5–5.0)
Alkaline Phosphatase: 79 U/L (ref 38–126)
Anion gap: 8 (ref 5–15)
BUN: 20 mg/dL (ref 8–23)
CO2: 30 mmol/L (ref 22–32)
Calcium: 9 mg/dL (ref 8.9–10.3)
Chloride: 102 mmol/L (ref 98–111)
Creatinine, Ser: 0.94 mg/dL (ref 0.44–1.00)
GFR, Estimated: >60 mL/min (ref >60)
Glucose, Bld: 114 mg/dL — ABNORMAL HIGH (ref 70–99)
Potassium: 3.7 mmol/L (ref 3.5–5.1)
Sodium: 140 mmol/L (ref 135–145)
Total Bilirubin: 0.4 mg/dL (ref 0.0–1.2)
Total Protein: 6.7 g/dL (ref 6.5–8.1)

## 2023-02-04 LAB — CBC WITH DIFFERENTIAL/PLATELET
Abs Immature Granulocytes: 0.01 10*3/uL (ref 0.00–0.07)
Basophils Absolute: 0 10*3/uL (ref 0.0–0.1)
Basophils Relative: 1 %
Eosinophils Absolute: 0.1 10*3/uL (ref 0.0–0.5)
Eosinophils Relative: 1 %
HCT: 42.3 % (ref 36.0–46.0)
Hemoglobin: 14.6 g/dL (ref 12.0–15.0)
Immature Granulocytes: 0 %
Lymphocytes Relative: 34 %
Lymphs Abs: 1.9 10*3/uL (ref 0.7–4.0)
MCH: 31.9 pg (ref 26.0–34.0)
MCHC: 34.5 g/dL (ref 30.0–36.0)
MCV: 92.6 fL (ref 80.0–100.0)
Monocytes Absolute: 0.4 10*3/uL (ref 0.1–1.0)
Monocytes Relative: 7 %
Neutro Abs: 3.1 10*3/uL (ref 1.7–7.7)
Neutrophils Relative %: 57 %
Platelets: 225 10*3/uL (ref 150–400)
RBC: 4.57 MIL/uL (ref 3.87–5.11)
RDW: 12.2 % (ref 11.5–15.5)
WBC: 5.5 10*3/uL (ref 4.0–10.5)
nRBC: 0 % (ref 0.0–0.2)

## 2023-02-04 LAB — D-DIMER, QUANTITATIVE: D-Dimer, Quant: 0.33 ug{FEU}/mL (ref 0.00–0.50)

## 2023-02-04 LAB — TROPONIN I (HIGH SENSITIVITY)
Troponin I (High Sensitivity): 3 ng/L (ref <18)
Troponin I (High Sensitivity): 3 ng/L (ref <18)

## 2023-02-04 NOTE — ED Provider Notes (Signed)
[place] EMERGENCY DEPARTMENT AT [facility] Provider Note   CSN: [id number] Arrival date & time: 02/04/23  1155     History  Chief Complaint  Patient presents with   Chest Pain    Tammy Weeks is a 68 y.o. female.  Patient here with chest discomfort.  About 2 hours ago.  Feeling better now.  She got a little bit of shortness of breath and some sweaty palms.  Family history of some heart disease in the family with father who passed away at 84 with heart disease.  She reports taking a Xanax prior to coming to the ED she also took a gummy of delta 8 last night to help with sleep as well.  She has high cholesterol history.  She states that she had a coronary CT a year ago that showed some mild disease but she is never had any stent placed or heart catheterization.  She is not having any symptoms now.  No recent surgery or travel.  Denies any weakness numbness tingling nausea vomiting diarrhea.  The history is provided by the patient.       Home Medications Prior to Admission medications   Medication Sig Start Date End Date Taking? Authorizing Provider  ALPRAZolam Prudy Feeler) 0.5 MG tablet take one by mouth daily as needed for anxiety 10/31/14  Yes [provider]  atorvastatin (LIPITOR) 40 MG tablet Take 40 mg by mouth daily.   Yes [provider]  Cholecalciferol (VITAMIN D3) 1.25 MG (50000 UT) CAPS Take 1 capsule by mouth once a week. 01/09/23  Yes [provider]  hydrochlorothiazide (MICROZIDE) 12.5 MG capsule Take 12.5 mg by mouth every morning. 06/20/19  Yes [provider]  losartan (COZAAR) 50 MG tablet Take 50 mg by mouth daily. 11/15/18  Yes [provider]  atorvastatin (LIPITOR) 20 MG tablet take one by mouth daily 10/23/14   [provider]  omeprazole (PRILOSEC) 40 MG capsule Take 40 mg by mouth as needed.  12/26/14   [provider]      Allergies    Other, Amoxicillin-pot clavulanate,  Ciprofloxacin, Codeine, Methaqualone, Oxycodone, Oxycodone-acetaminophen, Quinolones, and Sulfa antibiotics    Review of Systems   Review of Systems  Physical Exam Updated Vital Signs  ED Triage Vitals  Encounter Vitals Group     BP 02/04/23 1215 (!) 146/86     Systolic BP Percentile --      Diastolic BP Percentile --      Pulse Rate 02/04/23 1215 62     Resp 02/04/23 1215 17     Temp 02/04/23 1215 97.6 F (36.4 C)     Temp Source 02/04/23 1215 Oral     SpO2 02/04/23 1215 99 %     Weight 02/04/23 1210 155 lb (70.3 kg)     Height 02/04/23 1210 5\' 2"  (1.575 m)     Head Circumference --      Peak Flow --      Pain Score 02/04/23 1209 5     Pain Loc --      Pain Education --      Exclude from Growth Chart --      Physical Exam Vitals and nursing note reviewed.  Constitutional:      General: She is not in acute distress.    Appearance: She is well-developed. She is not ill-appearing.  HENT:     Head: Normocephalic and atraumatic.  Eyes:     Extraocular Movements: Extraocular movements intact.  Conjunctiva/sclera: Conjunctivae normal.     Pupils: Pupils are equal, round, and reactive to light.  Cardiovascular:     Rate and Rhythm: Normal rate and regular rhythm.     Pulses:          Radial pulses are 2+ on the right side and 2+ on the left side.     Heart sounds: Normal heart sounds. No murmur heard. Pulmonary:     Effort: Pulmonary effort is normal. No respiratory distress.     Breath sounds: Normal breath sounds.  Abdominal:     Palpations: Abdomen is soft.     Tenderness: There is no abdominal tenderness.  Musculoskeletal:        General: No swelling.     Cervical back: Normal range of motion and neck supple.  Skin:    General: Skin is warm and dry.     Capillary Refill: Capillary refill takes less than 2 seconds.  Neurological:     Mental Status: She is alert.  Psychiatric:        Mood and Affect: Mood normal.     ED Results / Procedures / Treatments    Labs (all labs ordered are listed, but only abnormal results are displayed) Labs Reviewed  COMPREHENSIVE METABOLIC PANEL - Abnormal; Notable for the following components:      Result Value   Glucose, Bld 114 (*)    All other components within normal limits  CBC WITH DIFFERENTIAL/PLATELET  D-DIMER, QUANTITATIVE  TROPONIN I (HIGH SENSITIVITY)  TROPONIN I (HIGH SENSITIVITY)    EKG EKG Interpretation Date/Time:  Wednesday February 04 2023 12:07:34 EST Ventricular Rate:  59 PR Interval:  157 QRS Duration:  90 QT Interval:  395 QTC Calculation: 392 R Axis:   39  Text Interpretation: Sinus rhythm Confirmed by Virgina Norfolk [phone]) on 02/04/2023 12:12:59 PM  Radiology DG Chest Portable 1 View Result Date: 02/04/2023 CLINICAL DATA:  Chest pain EXAM: PORTABLE CHEST 1 VIEW COMPARISON:  01/09/2020 FINDINGS: The lungs are clear without focal pneumonia, edema, pneumothorax or pleural effusion. Cardiopericardial silhouette is at upper limits of normal for size. No acute bony abnormality. Telemetry leads overlie the chest. IMPRESSION: No active disease. Electronically Signed   By: Kennith Center M.D.   On: 02/04/2023 12:39    Procedures Procedures    Medications Ordered in ED Medications - No data to display  ED Course/ Medical Decision Making/ A&P             HEART Score: 3                    Medical Decision Making Amount and/or Complexity of Data Reviewed Labs: ordered. Radiology: ordered.   Taralyn Marmion [facility] is here with chest pain.  Normal vitals.  No fever.  Sinus rhythm on EKG with no ischemic changes per my review interpretation.  She had a coronary CT a couple years ago with some mild disease.  She is been self-medicating with some Xanax and delta Gummies due to some sleep issues.  She does not admit to any stress or anxiety.  She has no recent surgery or travel.  Has reflux history.  History of hypertension high cholesterol anxiety and reflux.  Heart score is 3.  She is very  well-appearing.  Reassuring vitals.  Will get CBC BMP D-dimer troponin chest x-ray.  Per my review and interpretation labs D-dimer is normal.  Troponin negative x 2.  No significant anemia or electrolyte abnormality kidney injury or leukocytosis.  Chest x-ray negative for pneumonia or pneumothorax.  Overall clinically she appears well.  I suspect this could be stress related process or reflux or muscular process but she does have cardiac risk factors.  Will have her follow-up closely with her cardiologist for further outpatient workup.  She understands return precautions.  Have no concern for dissection or other emergent process at this time.  Understands return precautions and discharge.  This chart was dictated using voice recognition software.  Despite best efforts to proofread,  errors can occur which can change the documentation meaning.         Final Clinical Impression(s) / ED Diagnoses Final diagnoses:  Nonspecific chest pain    Rx / DC Orders ED Discharge Orders     None         Virgina Norfolk, DO 02/04/23 1422

## 2023-02-04 NOTE — ED Notes (Signed)
Xray tech with portable at patient bedside.

## 2023-02-04 NOTE — Discharge Instructions (Addendum)
Workup today is unremarkable as we discussed.  I recommend that you closely follow-up with your cardiologist and return if your symptoms worsen.

## 2023-02-04 NOTE — ED Triage Notes (Addendum)
Patient arrives POV from home c/o chest pain that started 2 hours ago. Patient reports chest pain is mid-chest and radiates to left shoulder and back. Pain is 5/10; patient reports chest pain feels tight. Pt reports sweaty palms, and SHOB on exertion. Patient states her father passed away of a heart attack at the age of 41. Patient a&o x4, no acute distress during triage. Patient reports taking 0.25mg  xanax prior to ED arrival; also states that she took 1/4 of a gummie of "delta 8" last night to help with sleep.

## 2023-02-04 NOTE — ED Notes (Signed)
ED Provider at bedside. 

## 2023-02-06 DIAGNOSIS — L820 Inflamed seborrheic keratosis: Secondary | ICD-10-CM | POA: Diagnosis not present

## 2023-02-06 DIAGNOSIS — Z85828 Personal history of other malignant neoplasm of skin: Secondary | ICD-10-CM | POA: Diagnosis not present

## 2023-02-06 DIAGNOSIS — L720 Epidermal cyst: Secondary | ICD-10-CM | POA: Diagnosis not present

## 2023-02-17 DIAGNOSIS — H52203 Unspecified astigmatism, bilateral: Secondary | ICD-10-CM | POA: Diagnosis not present

## 2023-02-17 DIAGNOSIS — H01005 Unspecified blepharitis left lower eyelid: Secondary | ICD-10-CM | POA: Diagnosis not present

## 2023-02-17 DIAGNOSIS — H01002 Unspecified blepharitis right lower eyelid: Secondary | ICD-10-CM | POA: Diagnosis not present

## 2023-02-17 DIAGNOSIS — Z961 Presence of intraocular lens: Secondary | ICD-10-CM | POA: Diagnosis not present

## 2023-03-10 ENCOUNTER — Telehealth: Payer: Self-pay | Admitting: Sports Medicine

## 2023-03-10 NOTE — Telephone Encounter (Signed)
 Copied from CRM [id number]. Topic: General - Other >> Mar 09, 2023  3:52 PM Kristie Cowman wrote: Reason for CRM:  The patient needs a prior authorization for an Orthovisc injection with Dr. Karie Schwalbe.   Best contact number - [phone]

## 2023-03-13 DIAGNOSIS — I10 Essential (primary) hypertension: Secondary | ICD-10-CM | POA: Diagnosis not present

## 2023-03-13 DIAGNOSIS — E782 Mixed hyperlipidemia: Secondary | ICD-10-CM | POA: Diagnosis not present

## 2023-03-13 DIAGNOSIS — E559 Vitamin D deficiency, unspecified: Secondary | ICD-10-CM | POA: Diagnosis not present

## 2023-03-16 DIAGNOSIS — M8580 Other specified disorders of bone density and structure, unspecified site: Secondary | ICD-10-CM | POA: Diagnosis not present

## 2023-03-16 DIAGNOSIS — Z1389 Encounter for screening for other disorder: Secondary | ICD-10-CM | POA: Diagnosis not present

## 2023-03-16 DIAGNOSIS — Z0000 Encounter for general adult medical examination without abnormal findings: Secondary | ICD-10-CM | POA: Diagnosis not present

## 2023-03-16 DIAGNOSIS — E782 Mixed hyperlipidemia: Secondary | ICD-10-CM | POA: Diagnosis not present

## 2023-03-16 DIAGNOSIS — G4700 Insomnia, unspecified: Secondary | ICD-10-CM | POA: Diagnosis not present

## 2023-03-16 DIAGNOSIS — R32 Unspecified urinary incontinence: Secondary | ICD-10-CM | POA: Diagnosis not present

## 2023-03-16 DIAGNOSIS — E559 Vitamin D deficiency, unspecified: Secondary | ICD-10-CM | POA: Diagnosis not present

## 2023-03-16 DIAGNOSIS — I700 Atherosclerosis of aorta: Secondary | ICD-10-CM | POA: Diagnosis not present

## 2023-03-16 DIAGNOSIS — M5090 Cervical disc disorder, unspecified, unspecified cervical region: Secondary | ICD-10-CM | POA: Diagnosis not present

## 2023-03-16 DIAGNOSIS — F419 Anxiety disorder, unspecified: Secondary | ICD-10-CM | POA: Diagnosis not present

## 2023-03-16 DIAGNOSIS — I10 Essential (primary) hypertension: Secondary | ICD-10-CM | POA: Diagnosis not present

## 2023-03-24 DIAGNOSIS — H6123 Impacted cerumen, bilateral: Secondary | ICD-10-CM | POA: Diagnosis not present

## 2023-03-31 NOTE — Telephone Encounter (Signed)
 I think it would be okay to work on approval, I am assuming the left knee only, x-ray confirmed arthritis, she has done Orthovisc in the past and it lasted 2-1/2 years.

## 2023-04-01 NOTE — Telephone Encounter (Addendum)
 Benefits Investigation Details received from [employer]: Orthovisc PA required: No May fill through: Buy and Bill  OV Copay/Coinsurance: 0% Product Copay: 0% Administration Coinsurance: 0% Administration Copay: $0 Deductible: $257 (Met: $257)  Benefit Investigation Started: 04/01/2023  Since deductible has been met, patient is responsible for a coinsurance.   Prior Authorization is NOT required.   The secondary plan follows Medicare guidelines. It will pick up remaining eligible expenses at 100%.   Patient is covered with a $0 coinsurance.  Covered: 01/14/2023 - 01/14/2024  Patient has been contacted. Front desk has been advised to reach out to set up an appointment.

## 2023-04-13 DIAGNOSIS — R1032 Left lower quadrant pain: Secondary | ICD-10-CM | POA: Diagnosis not present

## 2023-04-30 ENCOUNTER — Ambulatory Visit (INDEPENDENT_AMBULATORY_CARE_PROVIDER_SITE_OTHER): Payer: Medicare Other | Admitting: Cardiology

## 2023-04-30 VITALS — BP 124/78 | HR 76 | Ht 62.0 in | Wt 155.0 lb

## 2023-04-30 DIAGNOSIS — R072 Precordial pain: Secondary | ICD-10-CM | POA: Diagnosis not present

## 2023-04-30 DIAGNOSIS — Z8249 Family history of ischemic heart disease and other diseases of the circulatory system: Secondary | ICD-10-CM

## 2023-04-30 DIAGNOSIS — E7800 Pure hypercholesterolemia, unspecified: Secondary | ICD-10-CM

## 2023-04-30 MED ORDER — METOPROLOL TARTRATE 50 MG PO TABS: 50.0000 mg | ORAL_TABLET | ORAL | 0 refills | Status: DC

## 2023-04-30 NOTE — Progress Notes (Signed)
 Cardiology Office Note:  .   Date:  04/30/2023  ID:  Tammy Weeks, DOB 06-Sep-1955, MRN [id number] PCP: Jearldine Mina, MD  [facility] Providers Cardiologist:  None     History of Present Illness: Tammy Weeks   Tammy Weeks is a 67 y.o. female Discussed the use of AI scribe software for clinical note transcription with the patient, who gave verbal consent to proceed.  History of Present Illness She recently visited the emergency department due to elevated blood pressure and chest discomfort. The chest pain was mid-chest, radiating to the left shoulder and back, with a severity of 5 out of 10. She also experienced shortness of breath and sweaty palms. She took Xanax prior to the ER visit, which she believes may have been related to her symptoms.  Her medical history includes hypertension, hyperlipidemia, and anxiety. Current medications are atorvastatin 40 mg, hydrochlorothiazide 12.5 mg, and losartan 50 mg. She has previously taken Xanax for anxiety.  In 2017, she had nonspecific ST-T changes on her ECG and underwent a stress test, which was low risk with no ischemia and an ejection fraction of 65%. In October 2022, a coronary calcium score was 45, placing her in the 68th percentile, and showed focal valvular aortic calcification. A recent ECG in January 2025 showed sinus rhythm with T wave inversion and nonspecific changes, most notable in lead V4.  Her family history is significant for cardiovascular disease, with her father having had a myocardial infarction at age 32 and her brother having had a 'widow maker' heart attack at age 41 or 70.  She is a former smoker and reports walking 15 miles a day to maintain her health. She mentioned trying a delta-8 gummy for sleep issues, which she believes may have contributed to her recent symptoms.      Studies Reviewed: .        Results LABS Troponins: 3 Creatinine: 0.94 mg/dL Potassium: 3.7 mmol/L Hemoglobin: 14 g/dL Liver  function tests: AST 19 U/L LDL cholesterol: 102 mg/dL (40/9811)  RADIOLOGY Coronary calcium score: Score of 45, 68th percentile, focal valvular aortic calcification (10/30/2020) CT abdomen: Aortic atherosclerosis (01/2022)  DIAGNOSTIC ECG: Sinus rhythm 59 bpm, T wave inversion, nonspecific changes inferiorly and laterally, notable in V4 (02/04/2023) Risk Assessment/Calculations:            Physical Exam:   VS:  BP 124/78   Pulse 76   Ht 5\' 2"  (1.575 m)   Wt 155 lb (70.3 kg)   SpO2 95%   BMI 28.35 kg/m    Wt Readings from Last 3 Encounters:  04/30/23 155 lb (70.3 kg)  02/04/23 155 lb (70.3 kg)  10/06/22 160 lb (72.6 kg)    GEN: Well nourished, well developed in no acute distress NECK: No JVD; No carotid bruits CARDIAC: RRR, no murmurs, no rubs, no gallops RESPIRATORY:  Clear to auscultation without rales, wheezing or rhonchi  ABDOMEN: Soft, non-tender, non-distended EXTREMITIES:  No edema; No deformity   ASSESSMENT AND PLAN: .    Assessment and Plan Assessment & Plan Chest Pain She presented with mid-chest discomfort radiating to the left shoulder and back, rated 5/10 in severity, accompanied by dyspnea and diaphoresis. The pain resolved after a few hours. Differential diagnosis includes coronary artery disease, musculoskeletal pain, anxiety, and possible reaction to delta-8 THC. Given her family history of coronary artery disease and previous coronary calcification, further evaluation is warranted. - Order coronary CT scan to assess for coronary artery stenosis and soft plaque. -  Review results of coronary CT scan to determine further management.  Coronary Artery Disease She has coronary artery disease with a coronary calcium score of 45 (68th percentile) and focal valvular aortic calcification. Previous cardiac catheterization was reassuring. The coronary CT scan will assess current coronary artery status, providing information on stenosis or soft plaque to guide  management. - Order coronary CT scan to evaluate for coronary artery stenosis and soft plaque. - Consider adding Zetia to atorvastatin if LDL cholesterol remains above target after CT scan results.  Hyperlipidemia Her LDL cholesterol is 102 mg/dL, above the target of less than 70 mg/dL for coronary artery disease. She is on atorvastatin 40 mg. Lowering LDL to less than 70 mg/dL can reduce the risk of myocardial infarction and hospitalization. Consideration of adding Zetia to atorvastatin is discussed to achieve this target. - Consider adding Zetia to atorvastatin to achieve LDL cholesterol target of less than 70 mg/dL after reviewing coronary CT scan results.  Hypertension She experienced elevated blood pressure readings at home, prompting an emergency department visit. Her blood pressure normalized after a few hours. She is on losartan and hydrochlorothiazide for management. - Continue current antihypertensive regimen with losartan and hydrochlorothiazide.  Anxiety Anxiety may contribute to her symptoms of chest tightness and diaphoresis. She took Xanax prior to her emergency department visit. Anxiety can exacerbate symptoms and should be monitored. - Monitor anxiety symptoms and consider non-pharmacological interventions if needed.           Signed, Dorothye Gathers, MD

## 2023-04-30 NOTE — Patient Instructions (Signed)
 Medication Instructions:  The current medical regimen is effective;  continue present plan and medications.  *If you need a refill on your cardiac medications before your next appointment, please call your pharmacy*  Lab Work: None today If you have labs (blood work) drawn today and your tests are completely normal, you will receive your results only by: MyChart Message (if you have MyChart) OR A paper copy in the mail If you have any lab test that is abnormal or we need to change your treatment, we will call you to review the results.  Testing/Procedures:   Your cardiac CT will be scheduled at:   [facility] [address] [place], [place] [address] [phone]  Please arrive at the [facility] Entrance (Entrance C2) of [facility] 30 minutes prior to test start time. You can use the FREE valet parking offered at entrance C (encouraged to control the heart rate for the test)  Proceed to the [facility] Radiology Department (first floor) to check-in and test prep.  All radiology patients and guests should use entrance C2 at [facility], accessed from [facility], even though the hospital's physical address listed is [address]     Please follow these instructions carefully (unless otherwise directed):  An IV will be required for this test and Nitroglycerin will be given.  Hold all erectile dysfunction medications at least 3 days (72 hrs) prior to test. (Ie viagra, cialis, sildenafil, tadalafil, etc)   On the Night Before the Test: Be sure to Drink plenty of water. Do not consume any caffeinated/decaffeinated beverages or chocolate 12 hours prior to your test. Do not take any antihistamines 12 hours prior to your test.  On the Day of the Test: Drink plenty of water until 1 hour prior to the test. Do not eat any food 1 hour prior to test. You may take your regular medications prior to the test.  Take  metoprolol (Lopressor) two hours prior to test. If you take Furosemide/Hydrochlorothiazide/Spironolactone/Chlorthalidone, please HOLD on the morning of the test. Patients who wear a continuous glucose monitor MUST remove the device prior to scanning. FEMALES- please wear underwire-free bra if available, avoid dresses & tight clothing      After the Test: Drink plenty of water. After receiving IV contrast, you may experience a mild flushed feeling. This is normal. On occasion, you may experience a mild rash up to 24 hours after the test. This is not dangerous. If this occurs, you can take Benadryl 25 mg, Zyrtec, Claritin, or Allegra and increase your fluid intake. (Patients taking Tikosyn should avoid Benadryl, and may take Zyrtec, Claritin, or Allegra) If you experience trouble breathing, this can be serious. If it is severe call 911 IMMEDIATELY. If it is mild, please call our office.  We will call to schedule your test 2-4 weeks out understanding that some insurance companies will need an authorization prior to the service being performed.   For more information and frequently asked questions, please visit our website : [email or web address]  For non-scheduling related questions, please contact the cardiac imaging nurse navigator should you have any questions/concerns: Cardiac Imaging Nurse Navigators Direct Office Dial: [phone]   For scheduling needs, including cancellations and rescheduling, please call [place], [phone].   Follow-Up: At [facility], you and your health needs are our priority.  As part of our continuing mission to provide you with exceptional heart care, our providers are all part of one team.  This  team includes your primary Cardiologist (physician) and Advanced Practice Providers or APPs (Physician Assistants and Nurse Practitioners) who all work together to provide you with the care you need, when you need it.  Your next appointment:    Follow up will be based on results of the above testing.   We recommend signing up for the patient portal called "MyChart".  Sign up information is provided on this After Visit Summary.  MyChart is used to connect with patients for Virtual Visits (Telemedicine).  Patients are able to view lab/test results, encounter notes, upcoming appointments, etc.  Non-urgent messages can be sent to your provider as well.   To learn more about what you can do with MyChart, go to [email or web address].

## 2023-05-04 ENCOUNTER — Other Ambulatory Visit (INDEPENDENT_AMBULATORY_CARE_PROVIDER_SITE_OTHER)

## 2023-05-04 ENCOUNTER — Telehealth (HOSPITAL_COMMUNITY): Payer: Self-pay | Admitting: *Deleted

## 2023-05-04 ENCOUNTER — Ambulatory Visit (INDEPENDENT_AMBULATORY_CARE_PROVIDER_SITE_OTHER): Admitting: Sports Medicine

## 2023-05-04 ENCOUNTER — Encounter: Payer: Self-pay | Admitting: Sports Medicine

## 2023-05-04 DIAGNOSIS — M170 Bilateral primary osteoarthritis of knee: Secondary | ICD-10-CM | POA: Diagnosis not present

## 2023-05-04 MED ORDER — HYALURONAN 30 MG/2ML IX SOSY
15.0000 mg | PREFILLED_SYRINGE | Freq: Once | INTRA_ARTICULAR | Status: AC
  Administered 2023-05-04: 15 mg via INTRA_ARTICULAR

## 2023-05-04 NOTE — Assessment & Plan Note (Signed)
Orthovisc 1 of 4 left knee, return in 1 week for #2 of 4. 

## 2023-05-04 NOTE — Progress Notes (Signed)
    Procedures performed today:    Procedure: Real-time Ultrasound Guided injection of the left knee Device: Samsung HS60  Verbal informed consent obtained.  Time-out conducted.  Noted no overlying erythema, induration, or other signs of local infection.  Skin prepped in a sterile fashion.  Local anesthesia: Topical Ethyl chloride.  With sterile technique and under real time ultrasound guidance: Trace effusion noted, 30 mg/2 mL of OrthoVisc (sodium hyaluronate) in a prefilled syringe was injected easily into the knee through a 22-gauge needle. Completed without difficulty  Advised to call if fevers/chills, erythema, induration, drainage, or persistent bleeding.  Images permanently stored and available for review in PACS.  Impression: Technically successful ultrasound guided injection.  Independent interpretation of notes and tests performed by another provider:   None.  Brief History, Exam, Impression, and Recommendations:    Primary osteoarthritis of both knees Orthovisc #1 of 4 left knee, return in 1 week for #2 of 4.    ____________________________________________ Joselyn Nicely. Sandy Crumb, M.D., ABFM., CAQSM., AME. Primary Care and Sports Medicine [facility]  Adjunct Professor of [facility] Medicine  [employer]  Restaurant manager, fast food

## 2023-05-04 NOTE — Telephone Encounter (Signed)
 Reaching out to patient to offer assistance regarding upcoming cardiac imaging study; pt verbalizes understanding of appt date/time, parking situation and where to check in, pre-test NPO status and medications ordered, and verified current allergies; name and call back number provided for further questions should they arise Johney Frame RN Navigator Cardiac Imaging [facility] [phone] office [phone] cell

## 2023-05-05 ENCOUNTER — Ambulatory Visit (HOSPITAL_COMMUNITY)
Admission: RE | Admit: 2023-05-05 | Discharge: 2023-05-05 | Disposition: A | Discharge status: Home / Self Care | Source: Ambulatory Visit | Attending: Cardiology | Admitting: Cardiology

## 2023-05-05 DIAGNOSIS — R072 Precordial pain: Secondary | ICD-10-CM | POA: Diagnosis not present

## 2023-05-05 MED ORDER — NITROGLYCERIN 0.4 MG SL SUBL
0.8000 mg | SUBLINGUAL_TABLET | Freq: Once | SUBLINGUAL | Status: AC
  Administered 2023-05-05: 0.8 mg via SUBLINGUAL

## 2023-05-05 MED ORDER — IOHEXOL 350 MG/ML SOLN
100.0000 mL | Freq: Once | INTRAVENOUS | Status: AC | PRN
  Administered 2023-05-05: 100 mL via INTRAVENOUS

## 2023-05-05 MED ORDER — NITROGLYCERIN 0.4 MG SL SUBL
SUBLINGUAL_TABLET | SUBLINGUAL | Status: AC
  Filled 2023-05-05: qty 2

## 2023-05-06 ENCOUNTER — Encounter (HOSPITAL_BASED_OUTPATIENT_CLINIC_OR_DEPARTMENT_OTHER): Payer: Self-pay | Admitting: Cardiology

## 2023-05-06 ENCOUNTER — Other Ambulatory Visit: Payer: Self-pay | Admitting: *Deleted

## 2023-05-06 DIAGNOSIS — E7800 Pure hypercholesterolemia, unspecified: Secondary | ICD-10-CM

## 2023-05-06 DIAGNOSIS — Z79899 Other long term (current) drug therapy: Secondary | ICD-10-CM

## 2023-05-06 MED ORDER — EZETIMIBE 10 MG PO TABS: 10.0000 mg | ORAL_TABLET | Freq: Every day | ORAL | 3 refills | Status: DC

## 2023-05-12 ENCOUNTER — Ambulatory Visit (INDEPENDENT_AMBULATORY_CARE_PROVIDER_SITE_OTHER): Admitting: Sports Medicine

## 2023-05-12 ENCOUNTER — Other Ambulatory Visit (INDEPENDENT_AMBULATORY_CARE_PROVIDER_SITE_OTHER)

## 2023-05-12 DIAGNOSIS — M170 Bilateral primary osteoarthritis of knee: Secondary | ICD-10-CM | POA: Diagnosis not present

## 2023-05-12 MED ORDER — HYALURONAN 30 MG/2ML IX SOSY
15.0000 mg | PREFILLED_SYRINGE | Freq: Once | INTRA_ARTICULAR | Status: AC
  Administered 2023-05-12: 15 mg via INTRA_ARTICULAR

## 2023-05-12 NOTE — Progress Notes (Signed)
    Procedures performed today:    Procedure: Real-time Ultrasound Guided injection of the left knee Device: Samsung HS60  Verbal informed consent obtained.  Time-out conducted.  Noted no overlying erythema, induration, or other signs of local infection.  Skin prepped in a sterile fashion.  Local anesthesia: Topical Ethyl chloride.  With sterile technique and under real time ultrasound guidance: Trace effusion noted, 30 mg/2 mL of OrthoVisc (sodium hyaluronate) in a prefilled syringe was injected easily into the knee through a 22-gauge needle. Completed without difficulty  Advised to call if fevers/chills, erythema, induration, drainage, or persistent bleeding.  Images permanently stored and available for review in PACS.  Impression: Technically successful ultrasound guided injection.  Independent interpretation of notes and tests performed by another provider:   None.  Brief History, Exam, Impression, and Recommendations:    Primary osteoarthritis of both knees Orthovisc 2 of 4 left knee, return in 1 week for #3 of 4.    ____________________________________________ Joselyn Nicely. Sandy Crumb, M.D., ABFM., CAQSM., AME. Primary Care and Sports Medicine [facility]  Adjunct Professor of [facility] Medicine  [employer]  Restaurant manager, fast food

## 2023-05-12 NOTE — Addendum Note (Signed)
 Addended by: Montgomery Apgar on: 05/12/2023 04:33 PM   Modules accepted: Orders

## 2023-05-12 NOTE — Assessment & Plan Note (Signed)
Orthovisc 2 of 4 left knee, return in 1 week for #3 of 4 

## 2023-05-13 DIAGNOSIS — Z6829 Body mass index (BMI) 29.0-29.9, adult: Secondary | ICD-10-CM | POA: Diagnosis not present

## 2023-05-13 DIAGNOSIS — R319 Hematuria, unspecified: Secondary | ICD-10-CM | POA: Diagnosis not present

## 2023-05-13 DIAGNOSIS — B372 Candidiasis of skin and nail: Secondary | ICD-10-CM | POA: Diagnosis not present

## 2023-05-13 DIAGNOSIS — Z1272 Encounter for screening for malignant neoplasm of vagina: Secondary | ICD-10-CM | POA: Diagnosis not present

## 2023-05-13 DIAGNOSIS — Z124 Encounter for screening for malignant neoplasm of cervix: Secondary | ICD-10-CM | POA: Diagnosis not present

## 2023-05-18 ENCOUNTER — Other Ambulatory Visit (INDEPENDENT_AMBULATORY_CARE_PROVIDER_SITE_OTHER)

## 2023-05-18 ENCOUNTER — Ambulatory Visit (INDEPENDENT_AMBULATORY_CARE_PROVIDER_SITE_OTHER): Admitting: Sports Medicine

## 2023-05-18 ENCOUNTER — Encounter: Payer: Self-pay | Admitting: Sports Medicine

## 2023-05-18 DIAGNOSIS — M170 Bilateral primary osteoarthritis of knee: Secondary | ICD-10-CM

## 2023-05-18 MED ORDER — HYALURONAN 30 MG/2ML IX SOSY
15.0000 mg | PREFILLED_SYRINGE | Freq: Once | INTRA_ARTICULAR | Status: AC
  Administered 2023-05-18: 15 mg via INTRA_ARTICULAR

## 2023-05-18 NOTE — Progress Notes (Signed)
    Procedures performed today:    Procedure: Real-time Ultrasound Guided injection of the left knee Device: Samsung HS60  Verbal informed consent obtained.  Time-out conducted.  Noted no overlying erythema, induration, or other signs of local infection.  Skin prepped in a sterile fashion.  Local anesthesia: Topical Ethyl chloride.  With sterile technique and under real time ultrasound guidance: Trace effusion noted, 30 mg/2 mL of OrthoVisc (sodium hyaluronate) in a prefilled syringe was injected easily into the knee through a 22-gauge needle. Completed without difficulty  Advised to call if fevers/chills, erythema, induration, drainage, or persistent bleeding.  Images permanently stored and available for review in PACS.  Impression: Technically successful ultrasound guided injection.  Independent interpretation of notes and tests performed by another provider:   None.  Brief History, Exam, Impression, and Recommendations:    Primary osteoarthritis of both knees Orthovisc 3 of 4 left knee, return in 1 week for #4 of 4.    ____________________________________________ Joselyn Nicely. Sandy Crumb, M.D., ABFM., CAQSM., AME. Primary Care and Sports Medicine [facility] [facility]  Adjunct Professor of [facility] Medicine  [employer]  Restaurant manager, fast food

## 2023-05-18 NOTE — Addendum Note (Signed)
 Addended by: OLIVA-AVELLANEDA, Annastasia Haskins L on: 05/18/2023 02:18 PM   Modules accepted: Orders

## 2023-05-18 NOTE — Assessment & Plan Note (Addendum)
Orthovisc 3 of 4 left knee, return in 1 week for #4 of 4.

## 2023-05-26 ENCOUNTER — Other Ambulatory Visit (INDEPENDENT_AMBULATORY_CARE_PROVIDER_SITE_OTHER)

## 2023-05-26 ENCOUNTER — Ambulatory Visit (INDEPENDENT_AMBULATORY_CARE_PROVIDER_SITE_OTHER): Admitting: Sports Medicine

## 2023-05-26 DIAGNOSIS — M170 Bilateral primary osteoarthritis of knee: Secondary | ICD-10-CM

## 2023-05-26 MED ORDER — HYALURONAN 30 MG/2ML IX SOSY
15.0000 mg | PREFILLED_SYRINGE | Freq: Once | INTRA_ARTICULAR | Status: AC
Start: 2023-05-26 — End: 2023-05-26
  Administered 2023-05-26: 15 mg via INTRA_ARTICULAR

## 2023-05-26 NOTE — Telephone Encounter (Signed)
 In regards to Right Knee Orthovisc...  Benefits Investigation Details received from [employer] Injection: Orthovisc/Monovisc/Synvisc PA required: No May fill through: Buy and Bill OR Specialty Pharm[facility] Copay/Coinsurance: % Product Copay: % Administration Coinsurance: % Administration Copay: $ Deductible: $  Benefits Investigation for Right Knee Started

## 2023-05-26 NOTE — Addendum Note (Signed)
 Addended by: OLIVA-AVELLANEDA, Jenica Costilow L on: 05/26/2023 01:56 PM   Modules accepted: Orders

## 2023-05-26 NOTE — Telephone Encounter (Signed)
 Visco approval please, we just finished the left knee, she would like to start in the right knee.

## 2023-05-26 NOTE — Progress Notes (Signed)
    Procedures performed today:    Procedure: Real-time Ultrasound Guided injection of the left knee Device: Samsung HS60  Verbal informed consent obtained.  Time-out conducted.  Noted no overlying erythema, induration, or other signs of local infection.  Skin prepped in a sterile fashion.  Local anesthesia: Topical Ethyl chloride.  With sterile technique and under real time ultrasound guidance: No effusion noted, 30 mg/2 mL of OrthoVisc (sodium hyaluronate) in a prefilled syringe was injected easily into the knee through a 22-gauge needle. Completed without difficulty  Advised to call if fevers/chills, erythema, induration, drainage, or persistent bleeding.  Images permanently stored and available for review in PACS.  Impression: Technically successful ultrasound guided injection.  Independent interpretation of notes and tests performed by another provider:   None.  Brief History, Exam, Impression, and Recommendations:    Primary osteoarthritis of both knees Orthovisc 4 of 4 left knee, return 6 weeks as needed.    ____________________________________________ Joselyn Nicely. Sandy Crumb, M.D., ABFM., CAQSM., AME. Primary Care and Sports Medicine [place] [facility] [facility]  Adjunct Professor of [facility] Medicine  [employer]  Restaurant manager, fast food

## 2023-05-26 NOTE — Assessment & Plan Note (Signed)
 Orthovisc 4 of 4 left knee, return 6 weeks as needed.

## 2023-06-19 ENCOUNTER — Ambulatory Visit (INDEPENDENT_AMBULATORY_CARE_PROVIDER_SITE_OTHER)

## 2023-06-19 ENCOUNTER — Encounter: Payer: Self-pay | Admitting: Podiatry

## 2023-06-19 ENCOUNTER — Ambulatory Visit: Admitting: Podiatry

## 2023-06-19 VITALS — Ht 62.0 in | Wt 155.0 lb

## 2023-06-19 DIAGNOSIS — M778 Other enthesopathies, not elsewhere classified: Secondary | ICD-10-CM

## 2023-06-21 ENCOUNTER — Encounter (HOSPITAL_BASED_OUTPATIENT_CLINIC_OR_DEPARTMENT_OTHER): Payer: Self-pay | Admitting: Cardiology

## 2023-06-22 NOTE — Progress Notes (Signed)
 Subjective:   Patient ID: Tammy Weeks, female   DOB: 68 y.o.   MRN: [id number]   HPI Patient complains of a feeling like the redness between her toes and just was concerned left foot   ROS      Objective:  Physical Exam  Neurovascular status intact muscle strength found to be adequate range of motion adequate with patient found to have a possible small area of either nerve compression impingement or mild distal neuropathy left around the 2nd and 3rd digit with negative Amon Kand sign     Assessment:  Possibility for small area of nerve compression left but not causing her any balance issues or pathology from that standpoint     Plan:  H&P reviewed and at this point I have recommended wider shoes and soaks and it should be uneventful.  Reappoint to recheck as needed

## 2023-06-23 ENCOUNTER — Other Ambulatory Visit (HOSPITAL_BASED_OUTPATIENT_CLINIC_OR_DEPARTMENT_OTHER): Payer: Self-pay

## 2023-06-23 DIAGNOSIS — E7800 Pure hypercholesterolemia, unspecified: Secondary | ICD-10-CM

## 2023-06-24 DIAGNOSIS — Z85828 Personal history of other malignant neoplasm of skin: Secondary | ICD-10-CM | POA: Diagnosis not present

## 2023-06-24 DIAGNOSIS — D2372 Other benign neoplasm of skin of left lower limb, including hip: Secondary | ICD-10-CM | POA: Diagnosis not present

## 2023-06-24 DIAGNOSIS — L814 Other melanin hyperpigmentation: Secondary | ICD-10-CM | POA: Diagnosis not present

## 2023-06-24 DIAGNOSIS — L570 Actinic keratosis: Secondary | ICD-10-CM | POA: Diagnosis not present

## 2023-06-24 DIAGNOSIS — L820 Inflamed seborrheic keratosis: Secondary | ICD-10-CM | POA: Diagnosis not present

## 2023-06-24 DIAGNOSIS — L821 Other seborrheic keratosis: Secondary | ICD-10-CM | POA: Diagnosis not present

## 2023-06-24 DIAGNOSIS — D1801 Hemangioma of skin and subcutaneous tissue: Secondary | ICD-10-CM | POA: Diagnosis not present

## 2023-07-08 DIAGNOSIS — E7800 Pure hypercholesterolemia, unspecified: Secondary | ICD-10-CM | POA: Diagnosis not present

## 2023-07-08 DIAGNOSIS — Z79899 Other long term (current) drug therapy: Secondary | ICD-10-CM | POA: Diagnosis not present

## 2023-07-09 ENCOUNTER — Ambulatory Visit: Payer: Self-pay | Admitting: *Deleted

## 2023-07-09 DIAGNOSIS — E7800 Pure hypercholesterolemia, unspecified: Secondary | ICD-10-CM

## 2023-07-09 LAB — LIPID PANEL
Chol/HDL Ratio: 3.4 ratio (ref 0.0–4.4)
Cholesterol, Total: 199 mg/dL (ref 100–199)
HDL: 58 mg/dL (ref >39)
LDL Chol Calc (NIH): 124 mg/dL — ABNORMAL HIGH (ref 0–99)
Triglycerides: 95 mg/dL (ref 0–149)
VLDL Cholesterol Cal: 17 mg/dL (ref 5–40)

## 2023-07-20 DIAGNOSIS — Z1231 Encounter for screening mammogram for malignant neoplasm of breast: Secondary | ICD-10-CM | POA: Diagnosis not present

## 2023-07-22 DIAGNOSIS — R922 Inconclusive mammogram: Secondary | ICD-10-CM | POA: Diagnosis not present

## 2023-07-22 DIAGNOSIS — N6321 Unspecified lump in the left breast, upper outer quadrant: Secondary | ICD-10-CM | POA: Diagnosis not present

## 2023-08-10 DIAGNOSIS — N760 Acute vaginitis: Secondary | ICD-10-CM | POA: Diagnosis not present

## 2023-08-10 DIAGNOSIS — N898 Other specified noninflammatory disorders of vagina: Secondary | ICD-10-CM | POA: Diagnosis not present

## 2023-08-10 DIAGNOSIS — R319 Hematuria, unspecified: Secondary | ICD-10-CM | POA: Diagnosis not present

## 2023-08-11 ENCOUNTER — Ambulatory Visit: Attending: Internal Medicine | Admitting: Pharmacist Clinician (PhC)/ Clinical Pharmacy Specialist

## 2023-08-11 ENCOUNTER — Other Ambulatory Visit: Payer: Self-pay | Admitting: Pharmacist Clinician (PhC)/ Clinical Pharmacy Specialist

## 2023-08-11 ENCOUNTER — Encounter: Payer: Self-pay | Admitting: Pharmacist Clinician (PhC)/ Clinical Pharmacy Specialist

## 2023-08-11 DIAGNOSIS — E785 Hyperlipidemia, unspecified: Secondary | ICD-10-CM | POA: Insufficient documentation

## 2023-08-11 NOTE — Patient Instructions (Signed)
 Your Results:             Your most recent labs Goal  Total Cholesterol 199 < 200  Triglycerides 95 < 150  HDL (happy/good cholesterol) 58 > 40  LDL (lousy/bad cholesterol 124 < 70   Medication changes:  We will start the process to get Leqvio covered by your insurance.  Once the prior authorization is complete, I will send a MyChart message to let you know and confirm pharmacy information.   You will take the first two doses 3 months apart, then every 6 months thereafter   Lab orders:  We want to repeat labs after 4 months.  We will send you a lab order to remind you once we get closer to that time.     Thank you for choosing [facility]

## 2023-08-11 NOTE — Progress Notes (Signed)
 Office Visit    Patient Name: Tammy Weeks [facility] Date of Encounter: 08/11/2023  Primary Care Provider:  Ransom Other, MD Primary Cardiologist:  Jeffrie Anes  Chief Complaint    Hyperlipidemia   Significant Past Medical History   CAD CAC = 10.5 (48th percentile); TPV  29th percentile  HTN Now mostly WNL, on losartan, hctz     Allergies  Allergen Reactions   Other Other (See Comments)   Amoxicillin-Pot Clavulanate Other (See Comments) and Rash   Ciprofloxacin Rash   Codeine Rash   Methaqualone Rash   Oxycodone Rash   Oxycodone-Acetaminophen  Other (See Comments) and Rash   Quinolones Rash   Sulfa Antibiotics Rash    History of Present Illness    Tammy Weeks is a 68 y.o. female patient of Dr Jeffrie, in the office today to discuss options for cholesterol management.    Insurance Carrier:  [employer]  LDL Cholesterol goal:  LDL < 70  Current Medications:  atorvastatin 40 mg daily   Previously tried:  ezetimibe   - leg pains  Family Hx:  father had MI at 75 - deceased, brother at 94. Doing well; mother had cancer; other brother with high cholesterol  Social Hx: Tobacco: no Alcohol:  wine most evenings  Diet:    doesn't snack much, nothing from 7 pm to noon; likes fruits/vegetables; protein is chicken and beans  Exercise: walks 15 miles per week  Accessory Clinical Findings   Lab Results  Component Value Date   CHOL 199 07/08/2023   HDL 58 07/08/2023   LDLCALC 124 (H) 07/08/2023   TRIG 95 07/08/2023   CHOLHDL 3.4 07/08/2023    No results found for: LIPOA  Lab Results  Component Value Date   ALT 17 02/04/2023   AST 19 02/04/2023   ALKPHOS 79 02/04/2023   BILITOT 0.4 02/04/2023   Lab Results  Component Value Date   CREATININE 0.94 02/04/2023   BUN 20 02/04/2023   NA 140 02/04/2023   K 3.7 02/04/2023   CL 102 02/04/2023   CO2 30 02/04/2023   No results found for: HGBA1C  Home Medications    Current Outpatient Medications   Medication Sig Dispense Refill   ALPRAZolam (XANAX) 0.5 MG tablet take one by mouth daily as needed for anxiety  3   atorvastatin (LIPITOR) 40 MG tablet Take 40 mg by mouth daily.     Cholecalciferol (VITAMIN D3) 1.25 MG (50000 UT) CAPS Take 1 capsule by mouth once a week.     ezetimibe  (ZETIA ) 10 MG tablet Take 1 tablet (10 mg total) by mouth daily. 90 tablet 3   hydrochlorothiazide (MICROZIDE) 12.5 MG capsule Take 12.5 mg by mouth every morning.     losartan (COZAAR) 50 MG tablet Take 50 mg by mouth daily.     omeprazole (PRILOSEC) 40 MG capsule Take 40 mg by mouth as needed.   3   No current facility-administered medications for this visit.     Assessment & Plan    Hyperlipidemia LDL goal <70 Assessment: Patient with CAD not at LDL goal of < 70 Most recent LDL 124 on 07/08/23 Has been compliant with high intensity statin : atorvastatin 40 mg daily Reviewed options for lowering LDL cholesterol, including ezetimibe , PCSK-9 inhibitors, bempedoic acid and inclisiran.  Discussed mechanisms of action, dosing, side effects, potential decreases in LDL cholesterol and costs.  Also reviewed potential options for patient assistance.  Plan: Patient agreeable to starting Leqvio Repeat labs :  1  month after second dose Lipid Liver function    Allean Mink, PharmD CPP [facility] [address]   [place], [place] [address] [phone]  08/11/2023, 3:58 PM

## 2023-08-11 NOTE — Assessment & Plan Note (Signed)
 Assessment: Patient with CAD not at LDL goal of < 70 Most recent LDL 124 on 07/08/23 Has been compliant with high intensity statin : atorvastatin 40 mg daily Reviewed options for lowering LDL cholesterol, including ezetimibe , PCSK-9 inhibitors, bempedoic acid and inclisiran.  Discussed mechanisms of action, dosing, side effects, potential decreases in LDL cholesterol and costs.  Also reviewed potential options for patient assistance.  Plan: Patient agreeable to starting Leqvio Repeat labs :  1 month after second dose Lipid Liver function

## 2023-08-12 ENCOUNTER — Telehealth: Payer: Self-pay

## 2023-08-12 NOTE — Telephone Encounter (Signed)
 Dr. Jeffrie and Kristin, patient will be scheduled as soon as possible.  Auth Submission: NO AUTH NEEDED Site of care: Site of care: [facility] Payer: Medicare A/B with BCBS supplement Medication & CPT/J Code(s) submitted: Leqvio (Inclisiran) J1306 Diagnosis Code:  Route of submission (phone, fax, portal):  Phone # Fax # Auth type: Buy/Bill PB Units/visits requested: 284mg  x 2 doses Reference number:  Approval from: 08/12/23 to 02/13/24   Medicare A/B will cover 80%, BCBS supplement will cover the remaining 20%.

## 2023-08-25 DIAGNOSIS — N898 Other specified noninflammatory disorders of vagina: Secondary | ICD-10-CM | POA: Diagnosis not present

## 2023-08-25 DIAGNOSIS — N9089 Other specified noninflammatory disorders of vulva and perineum: Secondary | ICD-10-CM | POA: Diagnosis not present

## 2023-08-26 ENCOUNTER — Ambulatory Visit

## 2023-08-26 VITALS — BP 113/78 | HR 54 | Temp 98.4°F | Resp 18 | Ht 62.0 in | Wt 159.0 lb

## 2023-08-26 DIAGNOSIS — E785 Hyperlipidemia, unspecified: Secondary | ICD-10-CM | POA: Diagnosis not present

## 2023-08-26 MED ORDER — INCLISIRAN SODIUM 284 MG/1.5ML ~~LOC~~ SOSY
284.0000 mg | PREFILLED_SYRINGE | Freq: Once | SUBCUTANEOUS | Status: AC
Start: 2023-08-26 — End: 2023-08-26
  Administered 2023-08-26 (×2): 284 mg via SUBCUTANEOUS
  Filled 2023-08-26: qty 1.5

## 2023-08-26 NOTE — Progress Notes (Signed)
 Diagnosis: Hyperlipidemia  Provider:  Chilton Greathouse MD  Procedure: Injection  Leqvio (inclisiran), Dose: 284 mg, Site: subcutaneous, Number of injections: 1  Injection Site(s): Left arm  Post Care: Observation period completed  Discharge: Condition: Stable, Destination: Home . AVS Provided  Performed by:  Wyvonne Lenz, RN

## 2023-09-02 DIAGNOSIS — N901 Moderate vulvar dysplasia: Secondary | ICD-10-CM | POA: Diagnosis not present

## 2023-09-09 DIAGNOSIS — Z85828 Personal history of other malignant neoplasm of skin: Secondary | ICD-10-CM | POA: Diagnosis not present

## 2023-09-09 DIAGNOSIS — L720 Epidermal cyst: Secondary | ICD-10-CM | POA: Diagnosis not present

## 2023-09-15 ENCOUNTER — Encounter: Payer: Self-pay | Admitting: Sports Medicine

## 2023-09-16 DIAGNOSIS — M8580 Other specified disorders of bone density and structure, unspecified site: Secondary | ICD-10-CM | POA: Diagnosis not present

## 2023-09-16 DIAGNOSIS — F419 Anxiety disorder, unspecified: Secondary | ICD-10-CM | POA: Diagnosis not present

## 2023-09-16 DIAGNOSIS — I10 Essential (primary) hypertension: Secondary | ICD-10-CM | POA: Diagnosis not present

## 2023-09-16 DIAGNOSIS — E559 Vitamin D deficiency, unspecified: Secondary | ICD-10-CM | POA: Diagnosis not present

## 2023-09-16 DIAGNOSIS — E782 Mixed hyperlipidemia: Secondary | ICD-10-CM | POA: Diagnosis not present

## 2023-09-16 DIAGNOSIS — I700 Atherosclerosis of aorta: Secondary | ICD-10-CM | POA: Diagnosis not present

## 2023-09-16 DIAGNOSIS — I2510 Atherosclerotic heart disease of native coronary artery without angina pectoris: Secondary | ICD-10-CM | POA: Diagnosis not present

## 2023-09-23 DIAGNOSIS — N901 Moderate vulvar dysplasia: Secondary | ICD-10-CM | POA: Diagnosis not present

## 2023-09-30 DIAGNOSIS — Z23 Encounter for immunization: Secondary | ICD-10-CM | POA: Diagnosis not present

## 2023-10-07 DIAGNOSIS — J029 Acute pharyngitis, unspecified: Secondary | ICD-10-CM | POA: Diagnosis not present

## 2023-10-07 DIAGNOSIS — Z20822 Contact with and (suspected) exposure to covid-19: Secondary | ICD-10-CM | POA: Diagnosis not present

## 2023-10-15 DIAGNOSIS — G514 Facial myokymia: Secondary | ICD-10-CM | POA: Diagnosis not present

## 2023-11-24 DIAGNOSIS — L720 Epidermal cyst: Secondary | ICD-10-CM | POA: Diagnosis not present

## 2023-11-24 DIAGNOSIS — Z85828 Personal history of other malignant neoplasm of skin: Secondary | ICD-10-CM | POA: Diagnosis not present

## 2023-11-24 DIAGNOSIS — L2989 Other pruritus: Secondary | ICD-10-CM | POA: Diagnosis not present

## 2023-11-27 ENCOUNTER — Ambulatory Visit

## 2023-12-02 ENCOUNTER — Other Ambulatory Visit: Payer: Self-pay

## 2023-12-02 ENCOUNTER — Ambulatory Visit: Attending: Neurological Surgery

## 2023-12-02 DIAGNOSIS — R262 Difficulty in walking, not elsewhere classified: Secondary | ICD-10-CM | POA: Diagnosis present

## 2023-12-02 DIAGNOSIS — M5459 Other low back pain: Secondary | ICD-10-CM | POA: Diagnosis present

## 2023-12-02 DIAGNOSIS — M5416 Radiculopathy, lumbar region: Secondary | ICD-10-CM | POA: Insufficient documentation

## 2023-12-02 DIAGNOSIS — M6281 Muscle weakness (generalized): Secondary | ICD-10-CM | POA: Insufficient documentation

## 2023-12-02 DIAGNOSIS — R252 Cramp and spasm: Secondary | ICD-10-CM | POA: Diagnosis present

## 2023-12-02 DIAGNOSIS — G8929 Other chronic pain: Secondary | ICD-10-CM

## 2023-12-02 DIAGNOSIS — R293 Abnormal posture: Secondary | ICD-10-CM | POA: Diagnosis present

## 2023-12-02 DIAGNOSIS — M542 Cervicalgia: Secondary | ICD-10-CM

## 2023-12-02 NOTE — Therapy (Signed)
 OUTPATIENT PHYSICAL THERAPY CERVICAL EVALUATION   Patient Name: Tammy Weeks MRN: [id number] DOB:04/09/1955, 68 y.o., female Today's Date: 12/02/2023  END OF SESSION:  PT End of Session - 12/02/23 0801     Visit Number 1    Date for Recertification  01/27/24    Authorization Type Medicare    Progress Note Due on Visit 10    PT Start Time 0802    PT Stop Time 0837    PT Time Calculation (min) 35 min    Activity Tolerance Patient tolerated treatment well    Behavior During Therapy [facility] for tasks assessed/performed          Past Medical History:  Diagnosis Date   Anxiety    GERD (gastroesophageal reflux disease)    High cholesterol    Hypertension    Past Surgical History:  Procedure Laterality Date   ABDOMINAL HYSTERECTOMY     ADRENAL GLAND SURGERY     CATARACT EXTRACTION     LAPAROSCOPIC APPENDECTOMY N/A 01/29/2022   Procedure: APPENDECTOMY LAPAROSCOPIC;  Surgeon: Rubin Calamity, MD;  Location: [facility];  Service: General;  Laterality: N/A;   TRIGGER FINGER RELEASE Left 03/05/2021   Procedure: RELEASE TRIGGER FINGER/A-1 PULLEY LEFT RING FINGER;  Surgeon: Murrell Kuba, MD;  Location: [facility];  Service: Orthopedics;  Laterality: Left;  45 MIN   Patient Active Problem List   Diagnosis Date Noted   Hyperlipidemia LDL goal <70 08/11/2023   DDD (degenerative disc disease), cervical 07/18/2022   Lumbosacral spondylosis 05/21/2020   Coughing 01/09/2020   Herpes simplex 07/21/2019   Orbital wrinkles 08/19/2018   Primary osteoarthritis of both knees 07/13/2015   Low grade squamous intraepithelial lesion (LGSIL) on cervicovaginal cytologic smear 03/19/2011    PCP: Ransom Other, MD  REFERRING PROVIDER: Colon Shove, MD  REFERRING DIAG: M50.30 (ICD-10-CM) - DDD (degenerative disc disease), cervical  Rationale for Evaluation and Treatment: Rehabilitation  THERAPY DIAG:  Other low back pain - Plan: PT plan of care cert/re-cert  Radiculopathy,  lumbar region - Plan: PT plan of care cert/re-cert  Muscle weakness (generalized) - Plan: PT plan of care cert/re-cert  Cramp and spasm - Plan: PT plan of care cert/re-cert  Difficulty in walking, not elsewhere classified - Plan: PT plan of care cert/re-cert  Abnormal posture - Plan: PT plan of care cert/re-cert  ONSET DATE: 11/16/2023  SUBJECTIVE:                                                                                                                                                                                           SUBJECTIVE STATEMENT: Patient reports she was doing  fairly well and doing her exercises but he  PERTINENT HISTORY:  Prior PT for same symptoms  PAIN:  Are you having pain? Yes: NPRS scale: 1/10 Pain location: neck and left upper trap Pain description: aching Aggravating factors: turning her head as with driving Relieving factors: rest meds heat  PRECAUTIONS: None  RED FLAGS: None   WEIGHT BEARING RESTRICTIONS: No  FALLS:  Has patient fallen in last 6 months? No  LIVING ENVIRONMENT: Lives with: lives with their spouse Lives in: House/apartment  OCCUPATION: Retired  PLOF: Independent, Independent with basic ADLs, Independent with household mobility without device, Independent with community mobility without device, Independent with homemaking with ambulation, Independent with gait, and Independent with transfers  PATIENT GOALS: to reduce neck pain and be able to turn her head safely to drive/back up  NEXT MD VISIT: prn  OBJECTIVE:  Note: Objective measures were completed at Evaluation unless otherwise noted.  DIAGNOSTIC FINDINGS:  10/28/22 IMPRESSION: 1. Straightening of cervical lordosis with subtle degenerative anterolisthesis of C3 on C4. No acute osseous abnormality. 2. Moderate C4-C5 disc degeneration with a broad-based posterior component, mild mass effect on the ventral right spinal cord, but no spinal stenosis or cord signal  abnormality. Associated moderate to severe right neural foraminal stenosis, query right C5 radiculitis. 3. Mild to moderate left C6 neural foraminal stenosis. Up to mild right C4 and C6 neural foraminal stenosis.  PATIENT SURVEYS:  NDI:  NECK DISABILITY INDEX  Date: 12/02/23 Score  Total 7/50   Minimum Detectable Change (90% confidence): 5 points or 10% points  COGNITION: Overall cognitive status: Within functional limits for tasks assessed     SENSATION: WFL   POSTURE: rounded shoulders and forward head  PALPATION: Tight and tender with trigger points right upper trap  CERVICAL ROM:   AROM eval  Flexion WFL  Extension WFL  Right lateral flexion WFL  Left lateral flexion WFL  Right rotation WFL  Left rotation WFL   (Blank rows = not tested)  UPPER  EXTREMITY ROM:     WFL  UPPER EXTREMITY MMT:    Generally 4+ to 5/5 bilateral UE's  CERVICAL SPECIAL TESTS:  Spurlings positive on right  GAIT: Normal  TREATMENT DATE: 12/02/23     Initial eval completed Reviewed previous HEP                                                                                                                              PATIENT EDUCATION:  Education details: Reviewed previous HEP Person educated: Patient Education method: Explanation, Demonstration, and Verbal cues Education comprehension: verbalized understanding, returned demonstration, and verbal cues required  HOME EXERCISE PROGRAM: Access Code: [email or web address] URL: [email or web address] Date: 12/02/2023 Prepared by: Delon Haddock  Exercises - Prone Shoulder Extension - Single Arm  - 2 x daily - 7 x weekly - 2 sets - 10 reps - Prone Shoulder Row  - 2 x daily - 7 x weekly - 2 sets - 10  reps - Prone Single Arm Shoulder Horizontal Abduction with Scapular Retraction and Palm Down  - 2 x daily - 7 x weekly - 2 sets - 10 reps - Sidelying Shoulder External Rotation  - 2 x daily - 7 x weekly - 2 sets - 10 reps -  Single Arm Serratus Punches in Supine with Dumbbell  - 2 x daily - 7 x weekly - 2 sets - 10 reps - Cat Cow  - 1 x daily - 7 x weekly - 2 sets - 10 reps - Standing Shoulder Flexion to 90 Degrees with Dumbbells  - 2 x daily - 7 x weekly - 2 sets - 10 reps - Standing Shoulder Scaption  - 2 x daily - 7 x weekly - 2 sets - 10 reps - Wall Clock with Theraband  - 1 x daily - 7 x weekly - 2 sets - 5 reps - Neck Mobilization with Foam Roller  - 1 x daily - 7 x weekly - 2 sets - 10 reps - Sidelying Open Book Thoracic Rotation with Knee on Foam Roll  - 1 x daily - 7 x weekly - 1-2 sets - 10 reps - Supine Shoulder External Rotation on Foam Roll with Theraband  - 1 x daily - 7 x weekly - 2 sets - 10 reps - Hooklying Scapular Protraction on Foam Roll  - 1 x daily - 7 x weekly - 2 sets - 10 reps - Seated Assisted Cervical Rotation with Towel  - 1 x daily - 7 x weekly - 1 sets - 10 reps - 2-3 sec hold  ASSESSMENT:  CLINICAL IMPRESSION: Patient is a 68 y.o. female who was seen today for physical therapy evaluation and treatment for cervical and shoulder pain.   She presents with decreased C spine ROM, slight lack of postural strength and elevated pain.  She would benefit from skilled PT for ROM, cervical traction, postural strengthening, and dry needling to improve cervical stability and functional ROM along with pain control.    OBJECTIVE IMPAIRMENTS: decreased ROM, decreased strength, hypomobility, increased fascial restrictions, increased muscle spasms, impaired UE functional use, postural dysfunction, and pain.   ACTIVITY LIMITATIONS: carrying, lifting, sleeping, transfers, dressing, reach over head, and caring for others  PARTICIPATION LIMITATIONS: meal prep, cleaning, laundry, driving, shopping, community activity, and yard work  PERSONAL FACTORS: None are also affecting patient's functional outcome.   REHAB POTENTIAL: Good  CLINICAL DECISION MAKING: Stable/uncomplicated  EVALUATION COMPLEXITY:  Low   GOALS: Goals reviewed with patient? Yes  SHORT TERM GOALS: Target date: 12/30/2023  Patient to report pain no greater than 4/10  Baseline: Goal status: INITIAL  2.  Independence with initial HEP  Baseline:  Goal status: INITIAL  LONG TERM GOALS: Target date: 01/27/2024  Patient to report pain no greater than 2/10  Baseline:  Goal status: INITIAL  2.  Patient to be independent with advanced HEP  Baseline:  Goal status: INITIAL  3.  Patient to be able to turn head comfortably to be able to back up in her car or see oncoming traffic Baseline:  Goal status: INITIAL  4.  NDI to be 5/50 or less Baseline:  Goal status: INITIAL  5.  Patient to report 85% improvement in overall symptoms  Baseline:  Goal status: INITIAL   PLAN:  PT FREQUENCY: 2 times per week x 4 weeks then 1 time per week x 4 weeks  PT DURATION: other: see above  PLANNED INTERVENTIONS: 97110-Therapeutic exercises, 97530- Therapeutic activity, 97112-  Neuromuscular re-education, [id number]- Self Care, 02859- Manual therapy, [id number]- Gait training, [phone]- Canalith repositioning, [id number]- Aquatic Therapy, [phone]- Electrical stimulation (unattended), [phone]- Electrical stimulation (manual), [id number]- Vasopneumatic device, [id number]- Ultrasound, [id number]- Traction (mechanical), [id number]- Ionotophoresis 4mg /ml Dexamethasone , 79439 (1-2 muscles), 20561 (3+ muscles)- Dry Needling, Patient/Family education, Balance training, Stair training, Taping, Joint mobilization, Spinal mobilization, Vestibular training, Cryotherapy, and Moist heat.  PLAN FOR NEXT SESSION: Postural strengthening, DN if indicated, traction.  Review current HEP.    Delon B. Tamie Minteer, PT 12/02/23 1:02 PM [facility] [address], [address] [place], [place] [address] Phone # [phone] Fax [phone]

## 2023-12-16 DIAGNOSIS — R87612 Low grade squamous intraepithelial lesion on cytologic smear of cervix (LGSIL): Secondary | ICD-10-CM | POA: Diagnosis not present

## 2023-12-16 DIAGNOSIS — D071 Carcinoma in situ of vulva: Secondary | ICD-10-CM | POA: Diagnosis not present

## 2023-12-16 DIAGNOSIS — Z8742 Personal history of other diseases of the female genital tract: Secondary | ICD-10-CM | POA: Diagnosis not present

## 2023-12-22 ENCOUNTER — Ambulatory Visit: Attending: Neurological Surgery | Admitting: Physical Therapy

## 2023-12-22 ENCOUNTER — Encounter: Payer: Self-pay | Admitting: Physical Therapy

## 2023-12-22 DIAGNOSIS — M25512 Pain in left shoulder: Secondary | ICD-10-CM | POA: Diagnosis present

## 2023-12-22 DIAGNOSIS — M542 Cervicalgia: Secondary | ICD-10-CM | POA: Diagnosis present

## 2023-12-22 DIAGNOSIS — M5416 Radiculopathy, lumbar region: Secondary | ICD-10-CM | POA: Insufficient documentation

## 2023-12-22 DIAGNOSIS — R293 Abnormal posture: Secondary | ICD-10-CM | POA: Diagnosis present

## 2023-12-22 DIAGNOSIS — R262 Difficulty in walking, not elsewhere classified: Secondary | ICD-10-CM | POA: Diagnosis present

## 2023-12-22 DIAGNOSIS — M6281 Muscle weakness (generalized): Secondary | ICD-10-CM | POA: Insufficient documentation

## 2023-12-22 DIAGNOSIS — M5459 Other low back pain: Secondary | ICD-10-CM | POA: Diagnosis present

## 2023-12-22 DIAGNOSIS — G8929 Other chronic pain: Secondary | ICD-10-CM | POA: Diagnosis present

## 2023-12-22 DIAGNOSIS — R252 Cramp and spasm: Secondary | ICD-10-CM | POA: Insufficient documentation

## 2023-12-22 NOTE — Therapy (Addendum)
 OUTPATIENT PHYSICAL THERAPY CERVICAL TREATMENT   Patient Name: Tammy Weeks MRN: [id number] DOB:December 03, 1955, 68 y.o., female Today's Date: 12/22/2023  END OF SESSION:  PT End of Session - 12/22/23 0852     Visit Number 2    Authorization Type Medicare    PT Start Time 0845    PT Stop Time 0934    PT Time Calculation (min) 49 min    Activity Tolerance Patient tolerated treatment well    Behavior During Therapy WFL for tasks assessed/performed           Past Medical History:  Diagnosis Date   Anxiety    GERD (gastroesophageal reflux disease)    High cholesterol    Hypertension    Past Surgical History:  Procedure Laterality Date   ABDOMINAL HYSTERECTOMY     ADRENAL GLAND SURGERY     CATARACT EXTRACTION     LAPAROSCOPIC APPENDECTOMY N/A 01/29/2022   Procedure: APPENDECTOMY LAPAROSCOPIC;  Surgeon: Rubin Calamity, MD;  Location: [facility];  Service: General;  Laterality: N/A;   TRIGGER FINGER RELEASE Left 03/05/2021   Procedure: RELEASE TRIGGER FINGER/A-1 PULLEY LEFT RING FINGER;  Surgeon: Murrell Kuba, MD;  Location: [facility];  Service: Orthopedics;  Laterality: Left;  45 MIN   Patient Active Problem List   Diagnosis Date Noted   Hyperlipidemia LDL goal <70 08/11/2023   DDD (degenerative disc disease), cervical 07/18/2022   Lumbosacral spondylosis 05/21/2020   Coughing 01/09/2020   Herpes simplex 07/21/2019   Orbital wrinkles 08/19/2018   Primary osteoarthritis of both knees 07/13/2015   Low grade squamous intraepithelial lesion (LGSIL) on cervicovaginal cytologic smear 03/19/2011    PCP: Ransom Other, MD  REFERRING PROVIDER: Colon Shove, MD  REFERRING DIAG: M50.30 (ICD-10-CM) - DDD (degenerative disc disease), cervical  Rationale for Evaluation and Treatment: Rehabilitation  THERAPY DIAG:  Cervicalgia  Cramp and spasm  Chronic left shoulder pain  ONSET DATE: 11/16/2023  SUBJECTIVE:                                                                                                                                                                                            SUBJECTIVE STATEMENT: Have had general achiness all over my body. No pain, but achy today.  PERTINENT HISTORY:  Prior PT for same symptoms  PAIN:  Are you having pain? Yes: NPRS scale: 0/10 Pain location: neck and left upper trap Pain description: aching Aggravating factors: turning her head as with driving Relieving factors: rest meds heat  PRECAUTIONS: None  RED FLAGS: None   WEIGHT BEARING RESTRICTIONS: No  FALLS:  Has patient fallen in last 6 months?  No  LIVING ENVIRONMENT: Lives with: lives with their spouse Lives in: House/apartment  OCCUPATION: Retired  PLOF: Independent, Independent with basic ADLs, Independent with household mobility without device, Independent with community mobility without device, Independent with homemaking with ambulation, Independent with gait, and Independent with transfers  PATIENT GOALS: to reduce neck pain and be able to turn her head safely to drive/back up  NEXT MD VISIT: prn  OBJECTIVE:  Note: Objective measures were completed at Evaluation unless otherwise noted.  DIAGNOSTIC FINDINGS:  10/28/22 IMPRESSION: 1. Straightening of cervical lordosis with subtle degenerative anterolisthesis of C3 on C4. No acute osseous abnormality. 2. Moderate C4-C5 disc degeneration with a broad-based posterior component, mild mass effect on the ventral right spinal cord, but no spinal stenosis or cord signal abnormality. Associated moderate to severe right neural foraminal stenosis, query right C5 radiculitis. 3. Mild to moderate left C6 neural foraminal stenosis. Up to mild right C4 and C6 neural foraminal stenosis.  PATIENT SURVEYS:  NDI:  NECK DISABILITY INDEX  Date: 12/02/23 Score  Total 7/50   Minimum Detectable Change (90% confidence): 5 points or 10% points  COGNITION: Overall cognitive status:  Within functional limits for tasks assessed     SENSATION: WFL   POSTURE: rounded shoulders and forward head  PALPATION: Tight and tender with trigger points right upper trap  CERVICAL ROM:   AROM eval  Flexion WFL  Extension WFL  Right lateral flexion WFL  Left lateral flexion WFL  Right rotation WFL  Left rotation WFL   (Blank rows = not tested)  UPPER  EXTREMITY ROM:     WFL  UPPER EXTREMITY MMT:    Generally 4+ to 5/5 bilateral UE's  CERVICAL SPECIAL TESTS:  Spurlings positive on right  GAIT: Normal  TREATMENT DATE:   12/22/23 UBE L2 x 6 min 78fwd/3bwd PT present to discuss status 3 way reach with blue band x 10 B Prone 3D blue plyoball x 20 ea B Rows blue 2x10  (use weight next time) Ext Blue 2x10 (use weight next time) Seated UT stretch x 30 sec B Seated levator stretch x 30 sec B Prone T and Y 2  10 B (challenging)  Intermittent Mechanical cervical traction: 10#/5#  60 sec on/10 off x 12 min   12/02/23     Initial eval completed Reviewed previous HEP                                                                                                                              PATIENT EDUCATION:  Education details: Reviewed previous HEP Person educated: Patient Education method: Explanation, Demonstration, and Verbal cues Education comprehension: verbalized understanding, returned demonstration, and verbal cues required  HOME EXERCISE PROGRAM: Access Code: [email or web address] URL: [email or web address] Date: 12/02/2023 Prepared by: Delon Haddock  Exercises - Prone Shoulder Extension - Single Arm  - 2 x daily - 7 x weekly - 2 sets - 10 reps - Prone Shoulder Row  - 2  x daily - 7 x weekly - 2 sets - 10 reps - Prone Single Arm Shoulder Horizontal Abduction with Scapular Retraction and Palm Down  - 2 x daily - 7 x weekly - 2 sets - 10 reps - Sidelying Shoulder External Rotation  - 2 x daily - 7 x weekly - 2 sets - 10 reps - Single Arm  Serratus Punches in Supine with Dumbbell  - 2 x daily - 7 x weekly - 2 sets - 10 reps - Cat Cow  - 1 x daily - 7 x weekly - 2 sets - 10 reps - Standing Shoulder Flexion to 90 Degrees with Dumbbells  - 2 x daily - 7 x weekly - 2 sets - 10 reps - Standing Shoulder Scaption  - 2 x daily - 7 x weekly - 2 sets - 10 reps - Wall Clock with Theraband  - 1 x daily - 7 x weekly - 2 sets - 5 reps - Neck Mobilization with Foam Roller  - 1 x daily - 7 x weekly - 2 sets - 10 reps - Sidelying Open Book Thoracic Rotation with Knee on Foam Roll  - 1 x daily - 7 x weekly - 1-2 sets - 10 reps - Supine Shoulder External Rotation on Foam Roll with Theraband  - 1 x daily - 7 x weekly - 2 sets - 10 reps - Hooklying Scapular Protraction on Foam Roll  - 1 x daily - 7 x weekly - 2 sets - 10 reps - Seated Assisted Cervical Rotation with Towel  - 1 x daily - 7 x weekly - 1 sets - 10 reps - 2-3 sec hold  ASSESSMENT:  CLINICAL IMPRESSION: Patient presents with ongoing neck and shoulder discomfort. She denies pain today. Patient was challenged with most exercises today, especially prone T and Y. She should be able to progress rows and extension to matrix machine next visit. She also reported the levator stretch felt good and the left neck was tighter than the right side. Initiated mechanical traction at 10# today. Patient tolerated well and should be able to hvave increased pull next visit.   OBJECTIVE IMPAIRMENTS: decreased ROM, decreased strength, hypomobility, increased fascial restrictions, increased muscle spasms, impaired UE functional use, postural dysfunction, and pain.   ACTIVITY LIMITATIONS: carrying, lifting, sleeping, transfers, dressing, reach over head, and caring for others  PARTICIPATION LIMITATIONS: meal prep, cleaning, laundry, driving, shopping, community activity, and yard work  PERSONAL FACTORS: None are also affecting patient's functional outcome.   REHAB POTENTIAL: Good  CLINICAL DECISION MAKING:  Stable/uncomplicated  EVALUATION COMPLEXITY: Low   GOALS: Goals reviewed with patient? Yes  SHORT TERM GOALS: Target date: 12/30/2023  Patient to report pain no greater than 4/10  Baseline: Goal status: INITIAL  2.  Independence with initial HEP  Baseline:  Goal status: INITIAL  LONG TERM GOALS: Target date: 01/27/2024  Patient to report pain no greater than 2/10  Baseline:  Goal status: INITIAL  2.  Patient to be independent with advanced HEP  Baseline:  Goal status: INITIAL  3.  Patient to be able to turn head comfortably to be able to back up in her car or see oncoming traffic Baseline:  Goal status: INITIAL  4.  NDI to be 5/50 or less Baseline:  Goal status: INITIAL  5.  Patient to report 85% improvement in overall symptoms  Baseline:  Goal status: INITIAL   PLAN:  PT FREQUENCY: 2 times per week x 4 weeks then 1 time  per week x 4 weeks  PT DURATION: other: see above  PLANNED INTERVENTIONS: 97110-Therapeutic exercises, 97530- Therapeutic activity, [id number]- Neuromuscular re-education, 97535- Self Care, 02859- Manual therapy, [phone]- Gait training, [phone]- Canalith repositioning, [id number]- Aquatic Therapy, [phone]- Electrical stimulation (unattended), [phone]- Electrical stimulation (manual), [id number]- Vasopneumatic device, [id number]- Ultrasound, [id number]- Traction (mechanical), [id number]- Ionotophoresis 4mg /ml Dexamethasone , 79439 (1-2 muscles), 20561 (3+ muscles)- Dry Needling, Patient/Family education, Balance training, Stair training, Taping, Joint mobilization, Spinal mobilization, Vestibular training, Cryotherapy, and Moist heat.  PLAN FOR NEXT SESSION: Assess response to traction, Postural strengthening, DN if indicated, continue traction.  Review current HEP.    Mliss Cummins, PT  12/22/23 9:43 AM [facility] [address], [address] [place], [place] [address] Phone # [phone] Fax [phone]

## 2023-12-24 DIAGNOSIS — L814 Other melanin hyperpigmentation: Secondary | ICD-10-CM | POA: Diagnosis not present

## 2023-12-24 DIAGNOSIS — Z85828 Personal history of other malignant neoplasm of skin: Secondary | ICD-10-CM | POA: Diagnosis not present

## 2023-12-24 DIAGNOSIS — L570 Actinic keratosis: Secondary | ICD-10-CM | POA: Diagnosis not present

## 2023-12-24 DIAGNOSIS — L821 Other seborrheic keratosis: Secondary | ICD-10-CM | POA: Diagnosis not present

## 2023-12-24 DIAGNOSIS — D225 Melanocytic nevi of trunk: Secondary | ICD-10-CM | POA: Diagnosis not present

## 2023-12-24 DIAGNOSIS — C44622 Squamous cell carcinoma of skin of right upper limb, including shoulder: Secondary | ICD-10-CM | POA: Diagnosis not present

## 2023-12-25 ENCOUNTER — Ambulatory Visit

## 2023-12-25 DIAGNOSIS — R293 Abnormal posture: Secondary | ICD-10-CM

## 2023-12-25 DIAGNOSIS — R252 Cramp and spasm: Secondary | ICD-10-CM

## 2023-12-25 DIAGNOSIS — M6281 Muscle weakness (generalized): Secondary | ICD-10-CM

## 2023-12-25 DIAGNOSIS — M5416 Radiculopathy, lumbar region: Secondary | ICD-10-CM

## 2023-12-25 DIAGNOSIS — M542 Cervicalgia: Secondary | ICD-10-CM

## 2023-12-25 DIAGNOSIS — M5459 Other low back pain: Secondary | ICD-10-CM

## 2023-12-25 DIAGNOSIS — R262 Difficulty in walking, not elsewhere classified: Secondary | ICD-10-CM

## 2023-12-25 NOTE — Therapy (Signed)
 OUTPATIENT PHYSICAL THERAPY CERVICAL TREATMENT   Patient Name: Tammy Weeks MRN: [id number] DOB:06-Jun-1955, 68 y.o., female Today's Date: 12/25/2023  END OF SESSION:  PT End of Session - 12/25/23 1019     Visit Number 3    Date for Recertification  01/27/24    Authorization Type Medicare    PT Start Time 1019    PT Stop Time 1100    PT Time Calculation (min) 41 min    Activity Tolerance Patient tolerated treatment well    Behavior During Therapy WFL for tasks assessed/performed           Past Medical History:  Diagnosis Date   Anxiety    GERD (gastroesophageal reflux disease)    High cholesterol    Hypertension    Past Surgical History:  Procedure Laterality Date   ABDOMINAL HYSTERECTOMY     ADRENAL GLAND SURGERY     CATARACT EXTRACTION     LAPAROSCOPIC APPENDECTOMY N/A 01/29/2022   Procedure: APPENDECTOMY LAPAROSCOPIC;  Surgeon: Rubin Calamity, MD;  Location: [facility];  Service: General;  Laterality: N/A;   TRIGGER FINGER RELEASE Left 03/05/2021   Procedure: RELEASE TRIGGER FINGER/A-1 PULLEY LEFT RING FINGER;  Surgeon: Murrell Kuba, MD;  Location: [facility];  Service: Orthopedics;  Laterality: Left;  45 MIN   Patient Active Problem List   Diagnosis Date Noted   Hyperlipidemia LDL goal <70 08/11/2023   DDD (degenerative disc disease), cervical 07/18/2022   Lumbosacral spondylosis 05/21/2020   Coughing 01/09/2020   Herpes simplex 07/21/2019   Orbital wrinkles 08/19/2018   Primary osteoarthritis of both knees 07/13/2015   Low grade squamous intraepithelial lesion (LGSIL) on cervicovaginal cytologic smear 03/19/2011    PCP: Ransom Other, MD  REFERRING PROVIDER: Colon Shove, MD  REFERRING DIAG: M50.30 (ICD-10-CM) - DDD (degenerative disc disease), cervical  Rationale for Evaluation and Treatment: Rehabilitation  THERAPY DIAG:  Cervicalgia  Other low back pain  Radiculopathy, lumbar region  Muscle weakness (generalized)  Cramp  and spasm  Abnormal posture  Difficulty in walking, not elsewhere classified  ONSET DATE: 11/16/2023  SUBJECTIVE:                                                                                                                                                                                           SUBJECTIVE STATEMENT: No issues since last visit.    PERTINENT HISTORY:  Prior PT for same symptoms  PAIN:  12/25/23 Are you having pain? Yes: NPRS scale: 0/10 Pain location: neck and left upper trap Pain description: aching Aggravating factors: turning her head as with driving Relieving factors: rest meds heat  PRECAUTIONS:  None  RED FLAGS: None   WEIGHT BEARING RESTRICTIONS: No  FALLS:  Has patient fallen in last 6 months? No  LIVING ENVIRONMENT: Lives with: lives with their spouse Lives in: House/apartment  OCCUPATION: Retired  PLOF: Independent, Independent with basic ADLs, Independent with household mobility without device, Independent with community mobility without device, Independent with homemaking with ambulation, Independent with gait, and Independent with transfers  PATIENT GOALS: to reduce neck pain and be able to turn her head safely to drive/back up  NEXT MD VISIT: prn  OBJECTIVE:  Note: Objective measures were completed at Evaluation unless otherwise noted.  DIAGNOSTIC FINDINGS:  10/28/22 IMPRESSION: 1. Straightening of cervical lordosis with subtle degenerative anterolisthesis of C3 on C4. No acute osseous abnormality. 2. Moderate C4-C5 disc degeneration with a broad-based posterior component, mild mass effect on the ventral right spinal cord, but no spinal stenosis or cord signal abnormality. Associated moderate to severe right neural foraminal stenosis, query right C5 radiculitis. 3. Mild to moderate left C6 neural foraminal stenosis. Up to mild right C4 and C6 neural foraminal stenosis.  PATIENT SURVEYS:  NDI:  NECK DISABILITY INDEX  Date:  12/02/23 Score  Total 7/50   Minimum Detectable Change (90% confidence): 5 points or 10% points  COGNITION: Overall cognitive status: Within functional limits for tasks assessed     SENSATION: WFL   POSTURE: rounded shoulders and forward head  PALPATION: Tight and tender with trigger points right upper trap  CERVICAL ROM:   AROM eval  Flexion WFL  Extension WFL  Right lateral flexion WFL  Left lateral flexion WFL  Right rotation WFL  Left rotation WFL   (Blank rows = not tested)  UPPER  EXTREMITY ROM:     WFL  UPPER EXTREMITY MMT:    Generally 4+ to 5/5 bilateral UE's  CERVICAL SPECIAL TESTS:  Spurlings positive on right  GAIT: Normal  TREATMENT DATE:  12/25/23 UBE L2 x 6 min 7fwd/3bwd PT present to discuss status 3 way scapular stabilization with yellow band x 10 each UE 4D ball rolls x 20 each  Prone wing taps 2 x 10 Prone reach and pull 2 x 10 Intermittent Mechanical cervical traction: 14#/5#  60 sec on/10 off x 15 min  12/22/23 UBE L2 x 6 min 83fwd/3bwd PT present to discuss status 3 way reach with blue band x 10 B Prone 3D blue plyoball x 20 ea B Rows blue 2x10  (use weight next time) Ext Blue 2x10 (use weight next time) Seated UT stretch x 30 sec B Seated levator stretch x 30 sec B Prone T and Y 2  10 B (challenging)  Intermittent Mechanical cervical traction: 10#/5#  60 sec on/10 off x 12 min   12/02/23     Initial eval completed Reviewed previous HEP  PATIENT EDUCATION:  Education details: Reviewed previous HEP Person educated: Patient Education method: Explanation, Demonstration, and Verbal cues Education comprehension: verbalized understanding, returned demonstration, and verbal cues required  HOME EXERCISE PROGRAM: Access Code: [email or web address] URL: [email or web address] Date: 12/02/2023 Prepared by:  Delon Haddock  Exercises - Prone Shoulder Extension - Single Arm  - 2 x daily - 7 x weekly - 2 sets - 10 reps - Prone Shoulder Row  - 2 x daily - 7 x weekly - 2 sets - 10 reps - Prone Single Arm Shoulder Horizontal Abduction with Scapular Retraction and Palm Down  - 2 x daily - 7 x weekly - 2 sets - 10 reps - Sidelying Shoulder External Rotation  - 2 x daily - 7 x weekly - 2 sets - 10 reps - Single Arm Serratus Punches in Supine with Dumbbell  - 2 x daily - 7 x weekly - 2 sets - 10 reps - Cat Cow  - 1 x daily - 7 x weekly - 2 sets - 10 reps - Standing Shoulder Flexion to 90 Degrees with Dumbbells  - 2 x daily - 7 x weekly - 2 sets - 10 reps - Standing Shoulder Scaption  - 2 x daily - 7 x weekly - 2 sets - 10 reps - Wall Clock with Theraband  - 1 x daily - 7 x weekly - 2 sets - 5 reps - Neck Mobilization with Foam Roller  - 1 x daily - 7 x weekly - 2 sets - 10 reps - Sidelying Open Book Thoracic Rotation with Knee on Foam Roll  - 1 x daily - 7 x weekly - 1-2 sets - 10 reps - Supine Shoulder External Rotation on Foam Roll with Theraband  - 1 x daily - 7 x weekly - 2 sets - 10 reps - Hooklying Scapular Protraction on Foam Roll  - 1 x daily - 7 x weekly - 2 sets - 10 reps - Seated Assisted Cervical Rotation with Towel  - 1 x daily - 7 x weekly - 1 sets - 10 reps - 2-3 sec hold  ASSESSMENT:  CLINICAL IMPRESSION: [place] did well today and was able to tolerate prone wing taps and pull downs. We increased her traction weight to 14#.  She tolerated this well.  We will assess response to increased pull next visit. Patient would benefit from continued skilled PT to reach goals below.     OBJECTIVE IMPAIRMENTS: decreased ROM, decreased strength, hypomobility, increased fascial restrictions, increased muscle spasms, impaired UE functional use, postural dysfunction, and pain.   ACTIVITY LIMITATIONS: carrying, lifting, sleeping, transfers, dressing, reach over head, and caring for  others  PARTICIPATION LIMITATIONS: meal prep, cleaning, laundry, driving, shopping, community activity, and yard work  PERSONAL FACTORS: None are also affecting patient's functional outcome.   REHAB POTENTIAL: Good  CLINICAL DECISION MAKING: Stable/uncomplicated  EVALUATION COMPLEXITY: Low   GOALS: Goals reviewed with patient? Yes  SHORT TERM GOALS: Target date: 12/30/2023  Patient to report pain no greater than 4/10  Baseline: Goal status: INITIAL  2.  Independence with initial HEP  Baseline:  Goal status: INITIAL  LONG TERM GOALS: Target date: 01/27/2024  Patient to report pain no greater than 2/10  Baseline:  Goal status: INITIAL  2.  Patient to be independent with advanced HEP  Baseline:  Goal status: INITIAL  3.  Patient to be able to turn head comfortably to be able to back up in her car or see oncoming traffic Baseline:  Goal  status: INITIAL  4.  NDI to be 5/50 or less Baseline:  Goal status: INITIAL  5.  Patient to report 85% improvement in overall symptoms  Baseline:  Goal status: INITIAL   PLAN:  PT FREQUENCY: 2 times per week x 4 weeks then 1 time per week x 4 weeks  PT DURATION: other: see above  PLANNED INTERVENTIONS: 97110-Therapeutic exercises, 97530- Therapeutic activity, [id number]- Neuromuscular re-education, 97535- Self Care, [id number]- Manual therapy, [id number]- Gait training, [phone]- Canalith repositioning, [id number]- Aquatic Therapy, H9716- Electrical stimulation (unattended), [id number]- Electrical stimulation (manual), [id number]- Vasopneumatic device, [id number]- Ultrasound, [id number]- Traction (mechanical), [id number]- Ionotophoresis 4mg /ml Dexamethasone , 79439 (1-2 muscles), 20561 (3+ muscles)- Dry Needling, Patient/Family education, Balance training, Stair training, Taping, Joint mobilization, Spinal mobilization, Vestibular training, Cryotherapy, and Moist heat.  PLAN FOR NEXT SESSION: Assess response to traction, Postural strengthening, DN if indicated, continue traction.   Review current HEP.    Delon B. Prajna Vanderpool, PT 12/25/2023 11:15 AM [facility] [address], [address] [place], [place] [address] Phone # [phone] Fax [phone]

## 2023-12-28 ENCOUNTER — Ambulatory Visit: Admitting: Physical Therapy

## 2023-12-28 ENCOUNTER — Encounter: Payer: Self-pay | Admitting: Physical Therapy

## 2023-12-28 DIAGNOSIS — M542 Cervicalgia: Secondary | ICD-10-CM | POA: Diagnosis not present

## 2023-12-28 DIAGNOSIS — M5459 Other low back pain: Secondary | ICD-10-CM

## 2023-12-28 NOTE — Therapy (Signed)
 OUTPATIENT PHYSICAL THERAPY CERVICAL TREATMENT   Patient Name: Tammy Weeks MRN: [id number] DOB:11-27-1955, 68 y.o., female Today's Date: 12/28/2023  END OF SESSION:  PT End of Session - 12/28/23 1017     Visit Number 4    Date for Recertification  01/27/24    Authorization Type Medicare    Progress Note Due on Visit 10    PT Start Time 1017    PT Stop Time 1100    PT Time Calculation (min) 43 min    Activity Tolerance Patient tolerated treatment well    Behavior During Therapy WFL for tasks assessed/performed            Past Medical History:  Diagnosis Date   Anxiety    GERD (gastroesophageal reflux disease)    High cholesterol    Hypertension    Past Surgical History:  Procedure Laterality Date   ABDOMINAL HYSTERECTOMY     ADRENAL GLAND SURGERY     CATARACT EXTRACTION     LAPAROSCOPIC APPENDECTOMY N/A 01/29/2022   Procedure: APPENDECTOMY LAPAROSCOPIC;  Surgeon: Rubin Calamity, MD;  Location: [facility];  Service: General;  Laterality: N/A;   TRIGGER FINGER RELEASE Left 03/05/2021   Procedure: RELEASE TRIGGER FINGER/A-1 PULLEY LEFT RING FINGER;  Surgeon: Murrell Kuba, MD;  [facility];  Service: Orthopedics;  Laterality: Left;  45 MIN   Patient Active Problem List   Diagnosis Date Noted   Hyperlipidemia LDL goal <70 08/11/2023   DDD (degenerative disc disease), cervical 07/18/2022   Lumbosacral spondylosis 05/21/2020   Coughing 01/09/2020   Herpes simplex 07/21/2019   Orbital wrinkles 08/19/2018   Primary osteoarthritis of both knees 07/13/2015   Low grade squamous intraepithelial lesion (LGSIL) on cervicovaginal cytologic smear 03/19/2011    PCP: Ransom Other, MD  REFERRING PROVIDER: Colon Shove, MD  REFERRING DIAG: M50.30 (ICD-10-CM) - DDD (degenerative disc disease), cervical  Rationale for Evaluation and Treatment: Rehabilitation  THERAPY DIAG:  Cervicalgia  Other low back pain  ONSET DATE: 11/16/2023  SUBJECTIVE:                                                                                                                                                                                            SUBJECTIVE STATEMENT: The traction is really helping   PERTINENT HISTORY:  Prior PT for same symptoms  PAIN:  12/25/23 Are you having pain? Yes: NPRS scale: 0/10 Pain location: neck and left upper trap Pain description: aching Aggravating factors: turning her head as with driving Relieving factors: rest meds heat  PRECAUTIONS: None  RED FLAGS: None   WEIGHT BEARING RESTRICTIONS: No  FALLS:  Has patient fallen in last 6 months? No  LIVING ENVIRONMENT: Lives with: lives with their spouse Lives in: House/apartment  OCCUPATION: Retired  PLOF: Independent, Independent with basic ADLs, Independent with household mobility without device, Independent with community mobility without device, Independent with homemaking with ambulation, Independent with gait, and Independent with transfers  PATIENT GOALS: to reduce neck pain and be able to turn her head safely to drive/back up  NEXT MD VISIT: prn  OBJECTIVE:  Note: Objective measures were completed at Evaluation unless otherwise noted.  DIAGNOSTIC FINDINGS:  10/28/22 IMPRESSION: 1. Straightening of cervical lordosis with subtle degenerative anterolisthesis of C3 on C4. No acute osseous abnormality. 2. Moderate C4-C5 disc degeneration with a broad-based posterior component, mild mass effect on the ventral right spinal cord, but no spinal stenosis or cord signal abnormality. Associated moderate to severe right neural foraminal stenosis, query right C5 radiculitis. 3. Mild to moderate left C6 neural foraminal stenosis. Up to mild right C4 and C6 neural foraminal stenosis.  PATIENT SURVEYS:  NDI:  NECK DISABILITY INDEX  Date: 12/02/23 Score  Total 7/50   Minimum Detectable Change (90% confidence): 5 points or 10%  points  COGNITION: Overall cognitive status: Within functional limits for tasks assessed     SENSATION: WFL   POSTURE: rounded shoulders and forward head  PALPATION: Tight and tender with trigger points right upper trap  CERVICAL ROM:   AROM eval  Flexion WFL  Extension WFL  Right lateral flexion WFL  Left lateral flexion WFL  Right rotation WFL  Left rotation WFL   (Blank rows = not tested)  UPPER  EXTREMITY ROM:     WFL  UPPER EXTREMITY MMT:    Generally 4+ to 5/5 bilateral UE's  CERVICAL SPECIAL TESTS:  Spurlings positive on right  GAIT: Normal  TREATMENT DATE:  12/28/23 UBE L2 x 6 min 8fwd/3bwd PT present to discuss status 3 way scapular stabilization with yellow band x 10 each UE 4D ball rolls x 20 each  Prone wing taps 2 x 10 Prone reach and pull 2 x 10 Seated levator stretch 2x30 sec Intermittent Mechanical cervical traction: 14#/5#  60 sec on/10 off x 15 min   12/25/23 UBE L2 x 6 min 11fwd/3bwd PT present to discuss status 3 way scapular stabilization with yellow band x 10 each UE 4D ball rolls x 20 each  Prone wing taps 2 x 10 Prone reach and pull 2 x 10 Intermittent Mechanical cervical traction: 14#/5#  60 sec on/10 off x 15 min  12/22/23 UBE L2 x 6 min 41fwd/3bwd PT present to discuss status 3 way reach with blue band x 10 B Prone 3D blue plyoball x 20 ea B Rows blue 2x10  (use weight next time) Ext Blue 2x10 (use weight next time) Seated UT stretch x 30 sec B Seated levator stretch x 30 sec B Prone T and Y 2  10 B (challenging)  Intermittent Mechanical cervical traction: 10#/5#  60 sec on/10 off x 12 min   12/02/23     Initial eval completed Reviewed previous HEP  PATIENT EDUCATION:  Education details: Reviewed previous HEP Person educated: Patient Education method: Explanation, Demonstration, and Verbal  cues Education comprehension: verbalized understanding, returned demonstration, and verbal cues required  HOME EXERCISE PROGRAM: Access Code: [email or web address] URL: [email or web address] Date: 12/02/2023 Prepared by: Delon Haddock  Exercises - Prone Shoulder Extension - Single Arm  - 2 x daily - 7 x weekly - 2 sets - 10 reps - Prone Shoulder Row  - 2 x daily - 7 x weekly - 2 sets - 10 reps - Prone Single Arm Shoulder Horizontal Abduction with Scapular Retraction and Palm Down  - 2 x daily - 7 x weekly - 2 sets - 10 reps - Sidelying Shoulder External Rotation  - 2 x daily - 7 x weekly - 2 sets - 10 reps - Single Arm Serratus Punches in Supine with Dumbbell  - 2 x daily - 7 x weekly - 2 sets - 10 reps - Cat Cow  - 1 x daily - 7 x weekly - 2 sets - 10 reps - Standing Shoulder Flexion to 90 Degrees with Dumbbells  - 2 x daily - 7 x weekly - 2 sets - 10 reps - Standing Shoulder Scaption  - 2 x daily - 7 x weekly - 2 sets - 10 reps - Wall Clock with Theraband  - 1 x daily - 7 x weekly - 2 sets - 5 reps - Neck Mobilization with Foam Roller  - 1 x daily - 7 x weekly - 2 sets - 10 reps - Sidelying Open Book Thoracic Rotation with Knee on Foam Roll  - 1 x daily - 7 x weekly - 1-2 sets - 10 reps - Supine Shoulder External Rotation on Foam Roll with Theraband  - 1 x daily - 7 x weekly - 2 sets - 10 reps - Hooklying Scapular Protraction on Foam Roll  - 1 x daily - 7 x weekly - 2 sets - 10 reps - Seated Assisted Cervical Rotation with Towel  - 1 x daily - 7 x weekly - 1 sets - 10 reps - 2-3 sec hold  ASSESSMENT:  CLINICAL IMPRESSION: Tammy Weeks reports the traction is really helping. She had some stiffness today in her R side which she attributed to sleeping wrong. Good relief with levator stretch.  We kept traction at 14# today. Patient reports she gets a little HA with traction while she is on it which is likely due to the pressure of the neck pad.   OBJECTIVE IMPAIRMENTS: decreased ROM,  decreased strength, hypomobility, increased fascial restrictions, increased muscle spasms, impaired UE functional use, postural dysfunction, and pain.   ACTIVITY LIMITATIONS: carrying, lifting, sleeping, transfers, dressing, reach over head, and caring for others  PARTICIPATION LIMITATIONS: meal prep, cleaning, laundry, driving, shopping, community activity, and yard work  PERSONAL FACTORS: None are also affecting patient's functional outcome.   REHAB POTENTIAL: Good  CLINICAL DECISION MAKING: Stable/uncomplicated  EVALUATION COMPLEXITY: Low   GOALS: Goals reviewed with patient? Yes  SHORT TERM GOALS: Target date: 12/30/2023  Patient to report pain no greater than 4/10  Baseline: Goal status: INITIAL  2.  Independence with initial HEP  Baseline:  Goal status: INITIAL  LONG TERM GOALS: Target date: 01/27/2024  Patient to report pain no greater than 2/10  Baseline:  Goal status: INITIAL  2.  Patient to be independent with advanced HEP  Baseline:  Goal status: INITIAL  3.  Patient to be able to turn head comfortably to be able to back up in  her car or see oncoming traffic Baseline:  Goal status: INITIAL  4.  NDI to be 5/50 or less Baseline:  Goal status: INITIAL  5.  Patient to report 85% improvement in overall symptoms  Baseline:  Goal status: INITIAL   PLAN:  PT FREQUENCY: 2 times per week x 4 weeks then 1 time per week x 4 weeks  PT DURATION: other: see above  PLANNED INTERVENTIONS: 97110-Therapeutic exercises, 97530- Therapeutic activity, [id number]- Neuromuscular re-education, 97535- Self Care, 02859- Manual therapy, [id number]- Gait training, [phone]- Canalith repositioning, [id number]- Aquatic Therapy, H9716- Electrical stimulation (unattended), [phone]- Electrical stimulation (manual), [id number]- Vasopneumatic device, [id number]- Ultrasound, [id number]- Traction (mechanical), [id number]- Ionotophoresis 4mg /ml Dexamethasone , 79439 (1-2 muscles), 20561 (3+ muscles)- Dry Needling, Patient/Family  education, Balance training, Stair training, Taping, Joint mobilization, Spinal mobilization, Vestibular training, Cryotherapy, and Moist heat.  PLAN FOR NEXT SESSION: Assess response to traction, Postural strengthening, DN if indicated, continue traction.  Review current HEP.    Mliss Cummins, PT  12/28/2023 10:54 AM [facility] [address], [address] [place], [place] [address] Phone # [phone] Fax [phone]

## 2023-12-30 ENCOUNTER — Ambulatory Visit

## 2024-01-04 ENCOUNTER — Encounter: Payer: Self-pay | Admitting: Physical Therapy

## 2024-01-04 ENCOUNTER — Ambulatory Visit: Admitting: Physical Therapy

## 2024-01-04 DIAGNOSIS — M542 Cervicalgia: Secondary | ICD-10-CM

## 2024-01-04 DIAGNOSIS — M6281 Muscle weakness (generalized): Secondary | ICD-10-CM

## 2024-01-04 DIAGNOSIS — M5459 Other low back pain: Secondary | ICD-10-CM

## 2024-01-04 DIAGNOSIS — M5416 Radiculopathy, lumbar region: Secondary | ICD-10-CM

## 2024-01-04 NOTE — Therapy (Signed)
 " OUTPATIENT PHYSICAL THERAPY CERVICAL TREATMENT   Patient Name: Tammy Weeks MRN: [id number] DOB:02/19/1955, 68 y.o., female Today's Date: 01/04/2024  END OF SESSION:  PT End of Session - 01/04/24 1605     Visit Number 5    Date for Recertification  01/27/24    Authorization Type Medicare    Progress Note Due on Visit 10    PT Start Time 1533    PT Stop Time 1605    PT Time Calculation (min) 32 min    Activity Tolerance Patient tolerated treatment well    Behavior During Therapy WFL for tasks assessed/performed             Past Medical History:  Diagnosis Date   Anxiety    GERD (gastroesophageal reflux disease)    High cholesterol    Hypertension    Past Surgical History:  Procedure Laterality Date   ABDOMINAL HYSTERECTOMY     ADRENAL GLAND SURGERY     CATARACT EXTRACTION     LAPAROSCOPIC APPENDECTOMY N/A 01/29/2022   Procedure: APPENDECTOMY LAPAROSCOPIC;  Surgeon: Rubin Calamity, MD;  Location: [facility];  Service: General;  Laterality: N/A;   TRIGGER FINGER RELEASE Left 03/05/2021   Procedure: RELEASE TRIGGER FINGER/A-1 PULLEY LEFT RING FINGER;  Surgeon: Murrell Kuba, MD;  Location: [facility];  Service: Orthopedics;  Laterality: Left;  45 MIN   Patient Active Problem List   Diagnosis Date Noted   Hyperlipidemia LDL goal <70 08/11/2023   DDD (degenerative disc disease), cervical 07/18/2022   Lumbosacral spondylosis 05/21/2020   Coughing 01/09/2020   Herpes simplex 07/21/2019   Orbital wrinkles 08/19/2018   Primary osteoarthritis of both knees 07/13/2015   Low grade squamous intraepithelial lesion (LGSIL) on cervicovaginal cytologic smear 03/19/2011    PCP: Ransom Other, MD  REFERRING PROVIDER: Colon Shove, MD  REFERRING DIAG: M50.30 (ICD-10-CM) - DDD (degenerative disc disease), cervical  Rationale for Evaluation and Treatment: Rehabilitation  THERAPY DIAG:  Cervicalgia  Other low back pain  Radiculopathy, lumbar  region  Muscle weakness (generalized)  ONSET DATE: 11/16/2023  SUBJECTIVE:                                                                                                                                                                                           SUBJECTIVE STATEMENT: My back and neck are both sore from driving 8 hours and then today I was lifting at [employer].  PERTINENT HISTORY:  Prior PT for same symptoms  PAIN:  01/04/2024  Are you having pain? Yes: NPRS scale: 0/10 Pain location: neck and left upper trap Pain description: aching Aggravating factors: turning  her head as with driving Relieving factors: rest meds heat  PRECAUTIONS: None  RED FLAGS: None   WEIGHT BEARING RESTRICTIONS: No  FALLS:  Has patient fallen in last 6 months? No  LIVING ENVIRONMENT: Lives with: lives with their spouse Lives in: House/apartment  OCCUPATION: Retired  PLOF: Independent, Independent with basic ADLs, Independent with household mobility without device, Independent with community mobility without device, Independent with homemaking with ambulation, Independent with gait, and Independent with transfers  PATIENT GOALS: to reduce neck pain and be able to turn her head safely to drive/back up  NEXT MD VISIT: prn  OBJECTIVE:  Note: Objective measures were completed at Evaluation unless otherwise noted.  DIAGNOSTIC FINDINGS:  10/28/22 IMPRESSION: 1. Straightening of cervical lordosis with subtle degenerative anterolisthesis of C3 on C4. No acute osseous abnormality. 2. Moderate C4-C5 disc degeneration with a broad-based posterior component, mild mass effect on the ventral right spinal cord, but no spinal stenosis or cord signal abnormality. Associated moderate to severe right neural foraminal stenosis, query right C5 radiculitis. 3. Mild to moderate left C6 neural foraminal stenosis. Up to mild right C4 and C6 neural foraminal stenosis.  PATIENT SURVEYS:   NDI:  NECK DISABILITY INDEX  Date: 12/02/23 Score  Total 7/50   Minimum Detectable Change (90% confidence): 5 points or 10% points  COGNITION: Overall cognitive status: Within functional limits for tasks assessed     SENSATION: WFL   POSTURE: rounded shoulders and forward head  PALPATION: Tight and tender with trigger points right upper trap  CERVICAL ROM:   AROM eval  Flexion WFL  Extension WFL  Right lateral flexion WFL  Left lateral flexion WFL  Right rotation WFL  Left rotation WFL   (Blank rows = not tested)  UPPER  EXTREMITY ROM:     WFL  UPPER EXTREMITY MMT:    Generally 4+ to 5/5 bilateral UE's  CERVICAL SPECIAL TESTS:  Spurlings positive on right  GAIT: Normal  TREATMENT DATE:   01/04/24 UBE L2 x 6 min 53fwd/3bwd PT present to discuss status Foam roller at wall with squats to massage lumbar Doorway stretch QL 2x30 sec  Seated ball rollout to the left x 10 Seated diagonals with small ball from knee to opposite shoulder with eyes following ball TPR with firm ball to R QL  TPR to R QL in SL QL stretch in SL x 2 minutes  12/28/23 UBE L2 x 6 min 48fwd/3bwd PT present to discuss status 3 way scapular stabilization with yellow band x 10 each UE 4D ball rolls x 20 each  Prone wing taps 2 x 10 Prone reach and pull 2 x 10 Seated levator stretch 2x30 sec Intermittent Mechanical cervical traction: 14#/5#  60 sec on/10 off x 15 min   12/25/23 UBE L2 x 6 min 60fwd/3bwd PT present to discuss status 3 way scapular stabilization with yellow band x 10 each UE 4D ball rolls x 20 each  Prone wing taps 2 x 10 Prone reach and pull 2 x 10 Intermittent Mechanical cervical traction: 14#/5#  60 sec on/10 off x 15 min  12/22/23 UBE L2 x 6 min 33fwd/3bwd PT present to discuss status 3 way reach with blue band x 10 B Prone 3D blue plyoball x 20 ea B Rows blue 2x10  (use weight next time) Ext Blue 2x10 (use weight next time) Seated UT stretch x 30 sec B Seated  levator stretch x 30 sec B Prone T and Y 2  10 B (challenging)  Intermittent Mechanical cervical traction: 10#/5#  60 sec on/10 off x 12 min   12/02/23     Initial eval completed Reviewed previous HEP                                                                                                                              PATIENT EDUCATION:  Education details: Reviewed previous HEP Person educated: Patient Education method: Explanation, Demonstration, and Verbal cues Education comprehension: verbalized understanding, returned demonstration, and verbal cues required  HOME EXERCISE PROGRAM: Access Code: [email or web address] URL: [email or web address] Date: 12/02/2023 Prepared by: Delon Haddock  Exercises - Prone Shoulder Extension - Single Arm  - 2 x daily - 7 x weekly - 2 sets - 10 reps - Prone Shoulder Row  - 2 x daily - 7 x weekly - 2 sets - 10 reps - Prone Single Arm Shoulder Horizontal Abduction with Scapular Retraction and Palm Down  - 2 x daily - 7 x weekly - 2 sets - 10 reps - Sidelying Shoulder External Rotation  - 2 x daily - 7 x weekly - 2 sets - 10 reps - Single Arm Serratus Punches in Supine with Dumbbell  - 2 x daily - 7 x weekly - 2 sets - 10 reps - Cat Cow  - 1 x daily - 7 x weekly - 2 sets - 10 reps - Standing Shoulder Flexion to 90 Degrees with Dumbbells  - 2 x daily - 7 x weekly - 2 sets - 10 reps - Standing Shoulder Scaption  - 2 x daily - 7 x weekly - 2 sets - 10 reps - Wall Clock with Theraband  - 1 x daily - 7 x weekly - 2 sets - 5 reps - Neck Mobilization with Foam Roller  - 1 x daily - 7 x weekly - 2 sets - 10 reps - Sidelying Open Book Thoracic Rotation with Knee on Foam Roll  - 1 x daily - 7 x weekly - 1-2 sets - 10 reps - Supine Shoulder External Rotation on Foam Roll with Theraband  - 1 x daily - 7 x weekly - 2 sets - 10 reps - Hooklying Scapular Protraction on Foam Roll  - 1 x daily - 7 x weekly - 2 sets - 10 reps - Seated Assisted Cervical  Rotation with Towel  - 1 x daily - 7 x weekly - 1 sets - 10 reps - 2-3 sec hold  ASSESSMENT:  CLINICAL IMPRESSION: Julieanne presents with increased low back pain today after lifting her grandson last weekend, driving for 8 hours and then working at [employer]. She was very tight in her R QL. She responded very well to TPR and self-management techniques were taught to her to maintain flexibility. She did not want to do traction today. Treatment shortened per pt request. She has met her STG. She is progressing appropriately and continues to demonstrate potential for improvement. She  would benefit from continued skilled  therapy to address remaining impairments.    OBJECTIVE IMPAIRMENTS: decreased ROM, decreased strength, hypomobility, increased fascial restrictions, increased muscle spasms, impaired UE functional use, postural dysfunction, and pain.   ACTIVITY LIMITATIONS: carrying, lifting, sleeping, transfers, dressing, reach over head, and caring for others  PARTICIPATION LIMITATIONS: meal prep, cleaning, laundry, driving, shopping, community activity, and yard work  PERSONAL FACTORS: None are also affecting patient's functional outcome.   REHAB POTENTIAL: Good  CLINICAL DECISION MAKING: Stable/uncomplicated  EVALUATION COMPLEXITY: Low   GOALS: Goals reviewed with patient? Yes  SHORT TERM GOALS: Target date: 12/30/2023  Patient to report pain no greater than 4/10  Baseline: Goal status:MET   2.  Independence with initial HEP  Baseline:  Goal status: MET  LONG TERM GOALS: Target date: 01/27/2024  Patient to report pain no greater than 2/10  Baseline:  Goal status: INITIAL  2.  Patient to be independent with advanced HEP  Baseline:  Goal status: INITIAL  3.  Patient to be able to turn head comfortably to be able to back up in her car or see oncoming traffic Baseline:  Goal status: INITIAL  4.  NDI to be 5/50 or less Baseline:  Goal status: INITIAL  5.  Patient  to report 85% improvement in overall symptoms  Baseline:  Goal status: INITIAL   PLAN:  PT FREQUENCY: 2 times per week x 4 weeks then 1 time per week x 4 weeks  PT DURATION: other: see above  PLANNED INTERVENTIONS: 97110-Therapeutic exercises, 97530- Therapeutic activity, [id number]- Neuromuscular re-education, 97535- Self Care, 02859- Manual therapy, [id number]- Gait training, [phone]- Canalith repositioning, [id number]- Aquatic Therapy, [phone]- Electrical stimulation (unattended), [phone]- Electrical stimulation (manual), [id number]- Vasopneumatic device, [id number]- Ultrasound, [id number]- Traction (mechanical), [id number]- Ionotophoresis 4mg /ml Dexamethasone , 79439 (1-2 muscles), [id number] (3+ muscles)- Dry Needling, Patient/Family education, Balance training, Stair training, Taping, Joint mobilization, Spinal mobilization, Vestibular training, Cryotherapy, and Moist heat.  PLAN FOR NEXT SESSION: How is LBP, Postural strengthening, DN if indicated, continue traction.  Review current HEP.    Mliss Cummins, PT  01/04/2024 4:12 PM [facility] [address], [address] [place], [place] [address] Phone # [phone] Fax [phone]  "

## 2024-01-11 ENCOUNTER — Ambulatory Visit

## 2024-01-11 VITALS — BP 154/94 | HR 54 | Temp 97.9°F | Resp 18 | Ht 62.0 in | Wt 161.4 lb

## 2024-01-11 DIAGNOSIS — E785 Hyperlipidemia, unspecified: Secondary | ICD-10-CM | POA: Diagnosis not present

## 2024-01-11 MED ORDER — INCLISIRAN SODIUM 284 MG/1.5ML ~~LOC~~ SOSY
284.0000 mg | PREFILLED_SYRINGE | Freq: Once | SUBCUTANEOUS | Status: AC
  Administered 2024-01-11: 284 mg via SUBCUTANEOUS
  Filled 2024-01-11: qty 1.5

## 2024-01-11 NOTE — Progress Notes (Signed)
 Diagnosis: Hyperlipidemia  Provider:  Chilton Greathouse MD  Procedure: Injection  Leqvio (inclisiran), Dose: 284 mg, Site: subcutaneous, Number of injections: 1  Injection Site(s): Left arm  Post Care:  left arm injection  Discharge: Condition: Good, Destination: Home . AVS Declined  Performed by:  Rico Ala, LPN

## 2024-01-19 ENCOUNTER — Ambulatory Visit

## 2024-01-20 ENCOUNTER — Ambulatory Visit: Attending: Neurological Surgery

## 2024-01-20 DIAGNOSIS — G8929 Other chronic pain: Secondary | ICD-10-CM | POA: Insufficient documentation

## 2024-01-20 DIAGNOSIS — M5459 Other low back pain: Secondary | ICD-10-CM | POA: Insufficient documentation

## 2024-01-20 DIAGNOSIS — R262 Difficulty in walking, not elsewhere classified: Secondary | ICD-10-CM | POA: Insufficient documentation

## 2024-01-20 DIAGNOSIS — R252 Cramp and spasm: Secondary | ICD-10-CM | POA: Insufficient documentation

## 2024-01-20 DIAGNOSIS — M6281 Muscle weakness (generalized): Secondary | ICD-10-CM | POA: Insufficient documentation

## 2024-01-20 DIAGNOSIS — M542 Cervicalgia: Secondary | ICD-10-CM | POA: Insufficient documentation

## 2024-01-20 DIAGNOSIS — M25512 Pain in left shoulder: Secondary | ICD-10-CM | POA: Insufficient documentation

## 2024-01-20 DIAGNOSIS — M5416 Radiculopathy, lumbar region: Secondary | ICD-10-CM | POA: Insufficient documentation

## 2024-01-20 DIAGNOSIS — M25511 Pain in right shoulder: Secondary | ICD-10-CM | POA: Insufficient documentation

## 2024-01-20 DIAGNOSIS — R293 Abnormal posture: Secondary | ICD-10-CM | POA: Insufficient documentation

## 2024-01-20 NOTE — Therapy (Signed)
 " OUTPATIENT PHYSICAL THERAPY CERVICAL TREATMENT   Patient Name: Tammy Weeks MRN: [id number] DOB:04-28-1955, 69 y.o., female Today's Date: 01/20/2024  END OF SESSION:  PT End of Session - 01/20/24 0940     Visit Number 6    Date for Recertification  01/27/24    Authorization Type Medicare    Progress Note Due on Visit 10    PT Start Time 0935    PT Stop Time 1018    PT Time Calculation (min) 43 min    Activity Tolerance Patient tolerated treatment well    Behavior During Therapy WFL for tasks assessed/performed             Past Medical History:  Diagnosis Date   Anxiety    GERD (gastroesophageal reflux disease)    High cholesterol    Hypertension    Past Surgical History:  Procedure Laterality Date   ABDOMINAL HYSTERECTOMY     ADRENAL GLAND SURGERY     CATARACT EXTRACTION     LAPAROSCOPIC APPENDECTOMY N/A 01/29/2022   Procedure: APPENDECTOMY LAPAROSCOPIC;  Surgeon: Rubin Calamity, MD;  Location: [facility];  Service: General;  Laterality: N/A;   TRIGGER FINGER RELEASE Left 03/05/2021   Procedure: RELEASE TRIGGER FINGER/A-1 PULLEY LEFT RING FINGER;  Surgeon: Murrell Kuba, MD;  Location: [facility];  Service: Orthopedics;  Laterality: Left;  45 MIN   Patient Active Problem List   Diagnosis Date Noted   Hyperlipidemia LDL goal <70 08/11/2023   DDD (degenerative disc disease), cervical 07/18/2022   Lumbosacral spondylosis 05/21/2020   Coughing 01/09/2020   Herpes simplex 07/21/2019   Orbital wrinkles 08/19/2018   Primary osteoarthritis of both knees 07/13/2015   Low grade squamous intraepithelial lesion (LGSIL) on cervicovaginal cytologic smear 03/19/2011    PCP: Ransom Other, MD  REFERRING PROVIDER: Colon Shove, MD  REFERRING DIAG: M50.30 (ICD-10-CM) - DDD (degenerative disc disease), cervical  Rationale for Evaluation and Treatment: Rehabilitation  THERAPY DIAG:  Cervicalgia  Other low back pain  Radiculopathy, lumbar  region  Muscle weakness (generalized)  Cramp and spasm  Abnormal posture  ONSET DATE: 11/16/2023  SUBJECTIVE:                                                                                                                                                                                           SUBJECTIVE STATEMENT: I hurt my back picking up my 40 lb grandson but it's much better now.  My neck is hurting today.    PERTINENT HISTORY:  Prior PT for same symptoms  PAIN:  01/20/2024  Are you having pain? Yes: NPRS scale: 2/10 Pain location: neck  and left upper trap Pain description: aching Aggravating factors: turning her head as with driving Relieving factors: rest meds heat  PRECAUTIONS: None  RED FLAGS: None   WEIGHT BEARING RESTRICTIONS: No  FALLS:  Has patient fallen in last 6 months? No  LIVING ENVIRONMENT: Lives with: lives with their spouse Lives in: House/apartment  OCCUPATION: Retired  PLOF: Independent, Independent with basic ADLs, Independent with household mobility without device, Independent with community mobility without device, Independent with homemaking with ambulation, Independent with gait, and Independent with transfers  PATIENT GOALS: to reduce neck pain and be able to turn her head safely to drive/back up  NEXT MD VISIT: prn  OBJECTIVE:  Note: Objective measures were completed at Evaluation unless otherwise noted.  DIAGNOSTIC FINDINGS:  10/28/22 IMPRESSION: 1. Straightening of cervical lordosis with subtle degenerative anterolisthesis of C3 on C4. No acute osseous abnormality. 2. Moderate C4-C5 disc degeneration with a broad-based posterior component, mild mass effect on the ventral right spinal cord, but no spinal stenosis or cord signal abnormality. Associated moderate to severe right neural foraminal stenosis, query right C5 radiculitis. 3. Mild to moderate left C6 neural foraminal stenosis. Up to mild right C4 and C6 neural foraminal  stenosis.  PATIENT SURVEYS:  NDI:  NECK DISABILITY INDEX  Date: 12/02/23 Score  Total 7/50   Minimum Detectable Change (90% confidence): 5 points or 10% points  COGNITION: Overall cognitive status: Within functional limits for tasks assessed     SENSATION: WFL   POSTURE: rounded shoulders and forward head  PALPATION: Tight and tender with trigger points right upper trap  CERVICAL ROM:   AROM eval  Flexion WFL  Extension WFL  Right lateral flexion WFL  Left lateral flexion WFL  Right rotation WFL  Left rotation WFL   (Blank rows = not tested)  UPPER  EXTREMITY ROM:     WFL  UPPER EXTREMITY MMT:    Generally 4+ to 5/5 bilateral UE's  CERVICAL SPECIAL TESTS:  Spurlings positive on right  GAIT: Normal  TREATMENT DATE:  01/20/24 UBE L2 x 6 min 60fwd/3bwd PT present to discuss status 3 way scapular stabilization with yellow band x 10 each UE 4D ball rolls x 20 each with yellow plyo ball  Prone wing taps 2 x 10 Prone reach and pull 2 x 10 Seated levator stretch 2x30 sec Intermittent Mechanical cervical traction: 15#/5#  60 sec on/10 off x 15 min  01/04/24 UBE L2 x 6 min 56fwd/3bwd PT present to discuss status Foam roller at wall with squats to massage lumbar Doorway stretch QL 2x30 sec  Seated ball rollout to the left x 10 Seated diagonals with small ball from knee to opposite shoulder with eyes following ball TPR with firm ball to R QL  TPR to R QL in SL QL stretch in SL x 2 minutes  12/28/23 UBE L2 x 6 min 65fwd/3bwd PT present to discuss status 3 way scapular stabilization with yellow band x 10 each UE 4D ball rolls x 20 each  Prone wing taps 2 x 10 Prone reach and pull 2 x 10 Seated levator stretch 2x30 sec Intermittent Mechanical cervical traction: 14#/5#  60 sec on/10 off x 15 min  12/02/23     Initial eval completed Reviewed previous HEP  PATIENT EDUCATION:  Education details: Reviewed previous HEP Person educated: Patient Education method: Explanation, Demonstration, and Verbal cues Education comprehension: verbalized understanding, returned demonstration, and verbal cues required  HOME EXERCISE PROGRAM: Access Code: [email or web address] URL: [email or web address] Date: 12/02/2023 Prepared by: Delon Haddock  Exercises - Prone Shoulder Extension - Single Arm  - 2 x daily - 7 x weekly - 2 sets - 10 reps - Prone Shoulder Row  - 2 x daily - 7 x weekly - 2 sets - 10 reps - Prone Single Arm Shoulder Horizontal Abduction with Scapular Retraction and Palm Down  - 2 x daily - 7 x weekly - 2 sets - 10 reps - Sidelying Shoulder External Rotation  - 2 x daily - 7 x weekly - 2 sets - 10 reps - Single Arm Serratus Punches in Supine with Dumbbell  - 2 x daily - 7 x weekly - 2 sets - 10 reps - Cat Cow  - 1 x daily - 7 x weekly - 2 sets - 10 reps - Standing Shoulder Flexion to 90 Degrees with Dumbbells  - 2 x daily - 7 x weekly - 2 sets - 10 reps - Standing Shoulder Scaption  - 2 x daily - 7 x weekly - 2 sets - 10 reps - Wall Clock with Theraband  - 1 x daily - 7 x weekly - 2 sets - 5 reps - Neck Mobilization with Foam Roller  - 1 x daily - 7 x weekly - 2 sets - 10 reps - Sidelying Open Book Thoracic Rotation with Knee on Foam Roll  - 1 x daily - 7 x weekly - 1-2 sets - 10 reps - Supine Shoulder External Rotation on Foam Roll with Theraband  - 1 x daily - 7 x weekly - 2 sets - 10 reps - Hooklying Scapular Protraction on Foam Roll  - 1 x daily - 7 x weekly - 2 sets - 10 reps - Seated Assisted Cervical Rotation with Towel  - 1 x daily - 7 x weekly - 1 sets - 10 reps - 2-3 sec hold  ASSESSMENT:  CLINICAL IMPRESSION: Srihitha is progressing appropriately.  She is compliant and well motivated.  Her pain reports are minimal.  She is progressing toward final goals.  She  would benefit from continued skilled therapy to  address remaining impairments.    OBJECTIVE IMPAIRMENTS: decreased ROM, decreased strength, hypomobility, increased fascial restrictions, increased muscle spasms, impaired UE functional use, postural dysfunction, and pain.   ACTIVITY LIMITATIONS: carrying, lifting, sleeping, transfers, dressing, reach over head, and caring for others  PARTICIPATION LIMITATIONS: meal prep, cleaning, laundry, driving, shopping, community activity, and yard work  PERSONAL FACTORS: None are also affecting patient's functional outcome.   REHAB POTENTIAL: Good  CLINICAL DECISION MAKING: Stable/uncomplicated  EVALUATION COMPLEXITY: Low   GOALS: Goals reviewed with patient? Yes  SHORT TERM GOALS: Target date: 12/30/2023  Patient to report pain no greater than 4/10  Baseline: Goal status:MET   2.  Independence with initial HEP  Baseline:  Goal status: MET  LONG TERM GOALS: Target date: 01/27/2024  Patient to report pain no greater than 2/10  Baseline:  Goal status: INITIAL  2.  Patient to be independent with advanced HEP  Baseline:  Goal status: INITIAL  3.  Patient to be able to turn head comfortably to be able to back up in her car or see oncoming traffic Baseline:  Goal status: INITIAL  4.  NDI to be 5/50 or less Baseline:  Goal status: INITIAL  5.  Patient to report 85% improvement in overall symptoms  Baseline:  Goal status: INITIAL   PLAN:  PT FREQUENCY: 2 times per week x 4 weeks then 1 time per week x 4 weeks  PT DURATION: other: see above  PLANNED INTERVENTIONS: 97110-Therapeutic exercises, 97530- Therapeutic activity, [id number]- Neuromuscular re-education, 97535- Self Care, 02859- Manual therapy, [id number]- Gait training, [phone]- Canalith repositioning, [id number]- Aquatic Therapy, [phone]- Electrical stimulation (unattended), [phone]- Electrical stimulation (manual), [id number]- Vasopneumatic device, [id number]- Ultrasound, [id number]- Traction (mechanical), [id number]- Ionotophoresis 4mg /ml Dexamethasone ,  79439 (1-2 muscles), 20561 (3+ muscles)- Dry Needling, Patient/Family education, Balance training, Stair training, Taping, Joint mobilization, Spinal mobilization, Vestibular training, Cryotherapy, and Moist heat.  PLAN FOR NEXT SESSION: Postural strengthening, DN if indicated, continue traction.  Review current HEP.    Delon B. Nellie Chevalier, PT 01/20/2024 10:14 AM [facility] [address] [place], [place] [address] Phone # [phone] Fax [phone]  "

## 2024-01-21 ENCOUNTER — Ambulatory Visit

## 2024-01-25 ENCOUNTER — Ambulatory Visit: Admitting: Physical Therapy

## 2024-01-25 ENCOUNTER — Telehealth: Payer: Self-pay

## 2024-01-25 ENCOUNTER — Encounter: Payer: Self-pay | Admitting: Physical Therapy

## 2024-01-25 DIAGNOSIS — M5459 Other low back pain: Secondary | ICD-10-CM

## 2024-01-25 DIAGNOSIS — M542 Cervicalgia: Secondary | ICD-10-CM

## 2024-01-25 NOTE — Telephone Encounter (Signed)
 Auth Submission: NO AUTH NEEDED Site of care: Site of care: [facility] Payer: Medicare A/B with BCBS supplement Medication & CPT/J Code(s) submitted: Leqvio  (Inclisiran) J1306 Diagnosis Code:  Route of submission (phone, fax, portal):  Phone # Fax # Auth type: Buy/Bill PB Units/visits requested: 284mg  x 2 doses Reference number:  Approval from: 01/25/24 to 02/12/25

## 2024-01-25 NOTE — Therapy (Signed)
 " OUTPATIENT PHYSICAL THERAPY CERVICAL TREATMENT   Patient Name: Tammy Weeks MRN: [id number] DOB:11/17/1955, 69 y.o., female Today's Date: 01/25/2024  END OF SESSION:  PT End of Session - 01/25/24 0848     Visit Number 7    Date for Recertification  01/27/24    Authorization Type Medicare    Progress Note Due on Visit 10    PT Start Time 0848    PT Stop Time 0918    PT Time Calculation (min) 30 min    Activity Tolerance Patient tolerated treatment well    Behavior During Therapy [facility] for tasks assessed/performed              Past Medical History:  Diagnosis Date   Anxiety    GERD (gastroesophageal reflux disease)    High cholesterol    Hypertension    Past Surgical History:  Procedure Laterality Date   ABDOMINAL HYSTERECTOMY     ADRENAL GLAND SURGERY     CATARACT EXTRACTION     LAPAROSCOPIC APPENDECTOMY N/A 01/29/2022   Procedure: APPENDECTOMY LAPAROSCOPIC;  Surgeon: Rubin Calamity, MD;  Location: [facility];  Service: General;  Laterality: N/A;   TRIGGER FINGER RELEASE Left 03/05/2021   Procedure: RELEASE TRIGGER FINGER/A-1 PULLEY LEFT RING FINGER;  Surgeon: Murrell Kuba, MD;  Location: [facility];  Service: Orthopedics;  Laterality: Left;  45 MIN   Patient Active Problem List   Diagnosis Date Noted   Hyperlipidemia LDL goal <70 08/11/2023   DDD (degenerative disc disease), cervical 07/18/2022   Lumbosacral spondylosis 05/21/2020   Coughing 01/09/2020   Herpes simplex 07/21/2019   Orbital wrinkles 08/19/2018   Primary osteoarthritis of both knees 07/13/2015   Low grade squamous intraepithelial lesion (LGSIL) on cervicovaginal cytologic smear 03/19/2011    PCP: Ransom Other, MD  REFERRING PROVIDER: Colon Shove, MD  REFERRING DIAG: M50.30 (ICD-10-CM) - DDD (degenerative disc disease), cervical  Rationale for Evaluation and Treatment: Rehabilitation  THERAPY DIAG:  Cervicalgia  Other low back pain  ONSET DATE:  11/16/2023  SUBJECTIVE:                                                                                                                                                                                           SUBJECTIVE STATEMENT: Still feeling in R subscapular area. Not getting the numbness like I was before.  About 50% better/  PERTINENT HISTORY:  Prior PT for same symptoms  PAIN:  01/25/2024  Are you having pain? Yes: NPRS scale: 2/10 Pain location: neck and left upper trap Pain description: aching Aggravating factors: turning her head as with driving Relieving factors: rest meds  heat  PRECAUTIONS: None  RED FLAGS: None   WEIGHT BEARING RESTRICTIONS: No  FALLS:  Has patient fallen in last 6 months? No  LIVING ENVIRONMENT: Lives with: lives with their spouse Lives in: House/apartment  OCCUPATION: Retired  PLOF: Independent, Independent with basic ADLs, Independent with household mobility without device, Independent with community mobility without device, Independent with homemaking with ambulation, Independent with gait, and Independent with transfers  PATIENT GOALS: to reduce neck pain and be able to turn her head safely to drive/back up  NEXT MD VISIT: prn  OBJECTIVE:  Note: Objective measures were completed at Evaluation unless otherwise noted.  DIAGNOSTIC FINDINGS:  10/28/22 IMPRESSION: 1. Straightening of cervical lordosis with subtle degenerative anterolisthesis of C3 on C4. No acute osseous abnormality. 2. Moderate C4-C5 disc degeneration with a broad-based posterior component, mild mass effect on the ventral right spinal cord, but no spinal stenosis or cord signal abnormality. Associated moderate to severe right neural foraminal stenosis, query right C5 radiculitis. 3. Mild to moderate left C6 neural foraminal stenosis. Up to mild right C4 and C6 neural foraminal stenosis.  PATIENT SURVEYS:  NDI:  NECK DISABILITY INDEX  Date: 12/02/23 Score  Total  7/50   Minimum Detectable Change (90% confidence): 5 points or 10% points  COGNITION: Overall cognitive status: Within functional limits for tasks assessed     SENSATION: WFL   POSTURE: rounded shoulders and forward head  PALPATION: Tight and tender with trigger points right upper trap  CERVICAL ROM:   AROM eval  Flexion WFL  Extension WFL  Right lateral flexion WFL  Left lateral flexion WFL  Right rotation WFL  Left rotation WFL   (Blank rows = not tested)  UPPER  EXTREMITY ROM:     WFL  UPPER EXTREMITY MMT:    Generally 4+ to 5/5 bilateral UE's  CERVICAL SPECIAL TESTS:  Spurlings positive on right  GAIT: Normal  TREATMENT DATE:   01/25/24 UBE L2 x 6 min 36fwd/3bwd PT present to discuss status Assessed shoulder strength tissue tension in parascapular area, goals and neck ROM Reviewed neck stretches for rotation and flexion DN education and aftercare Trigger Point Dry Needling  Initial Treatment: Pt instructed on Dry Needling rational, procedures, and possible side effects. Pt instructed to expect mild to moderate muscle soreness later in the day and/or into the next day.  Pt instructed in methods to reduce muscle soreness. Pt instructed to continue prescribed HEP. Because Dry Needling was performed over or adjacent to a lung field, pt was educated on S/S of pneumothorax and to seek immediate medical attention should they occur.  Patient was educated on signs and symptoms of infection and other risk factors and advised to seek medical attention should they occur.  Patient verbalized understanding of these instructions and education.   Patient Verbal Consent Given: Yes Education Handout Provided: Yes Muscles Treated: R UT Electrical Stimulation Performed: No Treatment Response/Outcome: Utilized skilled palpation to identify bony landmarks and trigger points.  Able to illicit twitch response and muscle elongation.  Soft tissue mobilization to muscles needled  to further promote tissue elongation and decreased pain.     STM to R rhomboids with TPR   01/20/24 UBE L2 x 6 min 20fwd/3bwd PT present to discuss status 3 way scapular stabilization with yellow band x 10 each UE 4D ball rolls x 20 each with yellow plyo ball  Prone wing taps 2 x 10 Prone reach and pull 2 x 10 Seated levator stretch 2x30 sec Intermittent Mechanical cervical  traction: 15#/5#  60 sec on/10 off x 15 min  01/04/24 UBE L2 x 6 min 80fwd/3bwd PT present to discuss status Foam roller at wall with squats to massage lumbar Doorway stretch QL 2x30 sec  Seated ball rollout to the left x 10 Seated diagonals with small ball from knee to opposite shoulder with eyes following ball TPR with firm ball to R QL  TPR to R QL in SL QL stretch in SL x 2 minutes  12/28/23 UBE L2 x 6 min 22fwd/3bwd PT present to discuss status 3 way scapular stabilization with yellow band x 10 each UE 4D ball rolls x 20 each  Prone wing taps 2 x 10 Prone reach and pull 2 x 10 Seated levator stretch 2x30 sec Intermittent Mechanical cervical traction: 14#/5#  60 sec on/10 off x 15 min  12/02/23     Initial eval completed Reviewed previous HEP                                                                                                                              PATIENT EDUCATION:  Education details: Reviewed previous HEP Person educated: Patient Education method: Explanation, Demonstration, and Verbal cues Education comprehension: verbalized understanding, returned demonstration, and verbal cues required  HOME EXERCISE PROGRAM: Access Code: [email or web address] URL: [email or web address] Date: 12/02/2023 Prepared by: Delon Haddock  Exercises - Prone Shoulder Extension - Single Arm  - 2 x daily - 7 x weekly - 2 sets - 10 reps - Prone Shoulder Row  - 2 x daily - 7 x weekly - 2 sets - 10 reps - Prone Single Arm Shoulder Horizontal Abduction with Scapular Retraction and Palm Down  - 2 x  daily - 7 x weekly - 2 sets - 10 reps - Sidelying Shoulder External Rotation  - 2 x daily - 7 x weekly - 2 sets - 10 reps - Single Arm Serratus Punches in Supine with Dumbbell  - 2 x daily - 7 x weekly - 2 sets - 10 reps - Cat Cow  - 1 x daily - 7 x weekly - 2 sets - 10 reps - Standing Shoulder Flexion to 90 Degrees with Dumbbells  - 2 x daily - 7 x weekly - 2 sets - 10 reps - Standing Shoulder Scaption  - 2 x daily - 7 x weekly - 2 sets - 10 reps - Wall Clock with Theraband  - 1 x daily - 7 x weekly - 2 sets - 5 reps - Neck Mobilization with Foam Roller  - 1 x daily - 7 x weekly - 2 sets - 10 reps - Sidelying Open Book Thoracic Rotation with Knee on Foam Roll  - 1 x daily - 7 x weekly - 1-2 sets - 10 reps - Supine Shoulder External Rotation on Foam Roll with Theraband  - 1 x daily - 7 x weekly - 2 sets - 10 reps - Hooklying Scapular Protraction on Foam Roll  -  1 x daily - 7 x weekly - 2 sets - 10 reps - Seated Assisted Cervical Rotation with Towel  - 1 x daily - 7 x weekly - 1 sets - 10 reps - 2-3 sec hold  ASSESSMENT:  CLINICAL IMPRESSION: Patient reports 50% improvement overall since starting PT. She continues to report pain in the R rhomboids and UT and difficulty pain with neck rotation. She asked to try DN today. Shoulder strength was assessed and she had some mild pain with resisted flexion. Very mild strength deficits in flex and ABD. Her neck ROM is restricted in flexion and B rotation L > R. After education on DN pt did not want her rhomboids needled today. She had an excellent response in her R UT and reported improved flexibility at end of session. Patient did not want traction today.   Re-cert due next visit.  OBJECTIVE IMPAIRMENTS: decreased ROM, decreased strength, hypomobility, increased fascial restrictions, increased muscle spasms, impaired UE functional use, postural dysfunction, and pain.   ACTIVITY LIMITATIONS: carrying, lifting, sleeping, transfers, dressing, reach over  head, and caring for others  PARTICIPATION LIMITATIONS: meal prep, cleaning, laundry, driving, shopping, community activity, and yard work  PERSONAL FACTORS: None are also affecting patient's functional outcome.   REHAB POTENTIAL: Good  CLINICAL DECISION MAKING: Stable/uncomplicated  EVALUATION COMPLEXITY: Low   GOALS: Goals reviewed with patient? Yes  SHORT TERM GOALS: Target date: 12/30/2023  Patient to report pain no greater than 4/10  Baseline: Goal status:MET   2.  Independence with initial HEP  Baseline:  Goal status: MET  LONG TERM GOALS: Target date: 01/27/2024  Patient to report pain no greater than 2/10  Baseline:  Goal status: MET  2.  Patient to be independent with advanced HEP  Baseline:  Goal status: MET  3.  Patient to be able to turn head comfortably to be able to back up in her car or see oncoming traffic Baseline:  Goal status: PARTIALLY MET  4.  NDI to be 5/50 or less Baseline:  Goal status: INITIAL  5.  Patient to report 85% improvement in overall symptoms  Baseline:  Goal status: NOT MET 50%  01/25/24   PLAN:  PT FREQUENCY: 2 times per week x 4 weeks then 1 time per week x 4 weeks  PT DURATION: other: see above  PLANNED INTERVENTIONS: 97110-Therapeutic exercises, 97530- Therapeutic activity, 97112- Neuromuscular re-education, 97535- Self Care, [id number]- Manual therapy, [id number]- Gait training, [phone]- Canalith repositioning, [id number]- Aquatic Therapy, [id number]- Electrical stimulation (unattended), [id number]- Electrical stimulation (manual), [id number]- Vasopneumatic device, [id number]- Ultrasound, [id number]- Traction (mechanical), [id number]- Ionotophoresis 4mg /ml Dexamethasone , [id number] (1-2 muscles), [id number] (3+ muscles)- Dry Needling, Patient/Family education, Balance training, Stair training, Taping, Joint mobilization, Spinal mobilization, Vestibular training, Cryotherapy, and Moist heat.  PLAN FOR NEXT SESSION: Re-cert. Assess DN. Plan to continue 1x/wk for 4 more weeks.  Postural strengthening, DN if indicated, continue traction.     Mliss Cummins, PT  01/25/2024 9:33 AM [facility] [address] [place], [place] [address] Phone # [phone] Fax [phone]  "

## 2024-01-25 NOTE — Patient Instructions (Signed)

## 2024-01-27 ENCOUNTER — Ambulatory Visit: Admitting: Rehabilitative and Restorative Service Providers"

## 2024-01-27 ENCOUNTER — Encounter: Payer: Self-pay | Admitting: Rehabilitative and Restorative Service Providers"

## 2024-01-27 DIAGNOSIS — R252 Cramp and spasm: Secondary | ICD-10-CM

## 2024-01-27 DIAGNOSIS — M6281 Muscle weakness (generalized): Secondary | ICD-10-CM

## 2024-01-27 DIAGNOSIS — R262 Difficulty in walking, not elsewhere classified: Secondary | ICD-10-CM

## 2024-01-27 DIAGNOSIS — R293 Abnormal posture: Secondary | ICD-10-CM

## 2024-01-27 DIAGNOSIS — M5459 Other low back pain: Secondary | ICD-10-CM

## 2024-01-27 DIAGNOSIS — M5416 Radiculopathy, lumbar region: Secondary | ICD-10-CM

## 2024-01-27 DIAGNOSIS — M542 Cervicalgia: Secondary | ICD-10-CM

## 2024-01-27 NOTE — Therapy (Signed)
 " OUTPATIENT PHYSICAL THERAPY TREATMENT NOTE  AND REASSESSMENT NOTE   Patient Name: KAIYA BOATMAN MRN: [id number] DOB:February 24, 1955, 69 y.o., female Today's Date: 01/27/2024  Progress Note Reporting Period 12/02/2023 to 01/27/2024  See note below for Objective Data and Assessment of Progress/Goals.      END OF SESSION:  PT End of Session - 01/27/24 0847     Visit Number 8    Date for Recertification  03/25/24    Authorization Type Medicare    Progress Note Due on Visit 18    PT Start Time 0844    PT Stop Time 0940    PT Time Calculation (min) 56 min    Activity Tolerance Patient tolerated treatment well    Behavior During Therapy WFL for tasks assessed/performed              Past Medical History:  Diagnosis Date   Anxiety    GERD (gastroesophageal reflux disease)    High cholesterol    Hypertension    Past Surgical History:  Procedure Laterality Date   ABDOMINAL HYSTERECTOMY     ADRENAL GLAND SURGERY     CATARACT EXTRACTION     LAPAROSCOPIC APPENDECTOMY N/A 01/29/2022   Procedure: APPENDECTOMY LAPAROSCOPIC;  Surgeon: Rubin Calamity, MD;  Location: [facility];  Service: General;  Laterality: N/A;   TRIGGER FINGER RELEASE Left 03/05/2021   Procedure: RELEASE TRIGGER FINGER/A-1 PULLEY LEFT RING FINGER;  Surgeon: Murrell Kuba, MD;  Location: [facility];  Service: Orthopedics;  Laterality: Left;  45 MIN   Patient Active Problem List   Diagnosis Date Noted   Hyperlipidemia LDL goal <70 08/11/2023   DDD (degenerative disc disease), cervical 07/18/2022   Lumbosacral spondylosis 05/21/2020   Coughing 01/09/2020   Herpes simplex 07/21/2019   Orbital wrinkles 08/19/2018   Primary osteoarthritis of both knees 07/13/2015   Low grade squamous intraepithelial lesion (LGSIL) on cervicovaginal cytologic smear 03/19/2011    PCP: Ransom Other, MD  REFERRING PROVIDER: Colon Shove, MD  REFERRING DIAG: M50.30 (ICD-10-CM) - DDD (degenerative disc disease),  cervical  Rationale for Evaluation and Treatment: Rehabilitation  THERAPY DIAG:  Cervicalgia  Other low back pain  Radiculopathy, lumbar region  Muscle weakness (generalized)  Cramp and spasm  Abnormal posture  Difficulty in walking, not elsewhere classified  ONSET DATE: 11/16/2023  SUBJECTIVE:                                                                                                                                                                                           SUBJECTIVE STATEMENT: Patient states that she is having some pain today, especially with end range  rotation.  PERTINENT HISTORY:  Cervical DDD, lumbosacral spondylosis, knee OA  PAIN:  01/27/2024  Are you having pain? Yes: NPRS scale: 2-3/10 Pain location: neck and left upper trap Pain description: aching Aggravating factors: turning her head as with driving Relieving factors: rest meds heat  PRECAUTIONS: None  RED FLAGS: None   WEIGHT BEARING RESTRICTIONS: No  FALLS:  Has patient fallen in last 6 months? No  LIVING ENVIRONMENT: Lives with: lives with their spouse Lives in: House/apartment  OCCUPATION: Retired  PLOF: Independent, Independent with basic ADLs, Independent with household mobility without device, Independent with community mobility without device, Independent with homemaking with ambulation, Independent with gait, and Independent with transfers  PATIENT GOALS: to reduce neck pain and be able to turn her head safely to drive/back up  NEXT MD VISIT: prn  OBJECTIVE:  Note: Objective measures were completed at Evaluation unless otherwise noted.  DIAGNOSTIC FINDINGS:  Cervical MRI on 10/08/2022: IMPRESSION: 1. Straightening of cervical lordosis with subtle degenerative anterolisthesis of C3 on C4. No acute osseous abnormality. 2. Moderate C4-C5 disc degeneration with a broad-based posterior component, mild mass effect on the ventral right spinal cord, but no spinal  stenosis or cord signal abnormality. Associated moderate to severe right neural foraminal stenosis, query right C5 radiculitis. 3. Mild to moderate left C6 neural foraminal stenosis. Up to mild right C4 and C6 neural foraminal stenosis.  PATIENT SURVEYS:   Eval:  Neck Disability Index (NDI): 7/50 = 14%  01/27/2024:  Neck Disability Index (NDI):  8/50 = 16%   Minimum Detectable Change (90% confidence): 5 points or 10% points  COGNITION: Overall cognitive status: Within functional limits for tasks assessed     SENSATION: WFL   POSTURE: rounded shoulders and forward head  PALPATION: Tight and tender with trigger points right upper trap  CERVICAL ROM:   AROM eval 01/27/24  Flexion WFL 35  Extension WFL 55  Right lateral flexion WFL 35  Left lateral flexion WFL 40  Right rotation WFL 66  Left rotation WFL 65   (Blank rows = not tested)  UPPER  EXTREMITY ROM:     Eval:  WFL  01/27/2024:  WFL, but some pain with right shoulder movement  UPPER EXTREMITY MMT:    Generally 4+ to 5/5 bilateral UE's  CERVICAL SPECIAL TESTS:  Spurlings positive on right  GAIT: Normal  POSTURE:    01/27/2024:  noted right shoulder and scapula more forwardly rotated than the left    TREATMENT DATE:   01/27/2024: UBE level 2.0 x3 min each direction with PT present to discuss status Neck Disability Index (NDI):  8/50 = 16% Assessment of shoulder/cervical Standing lower trap lift-off 2x10 Standing rows with blue tband 2x10 3 way scapular stabilization with yellow band 2x5 bilat Standing open book with red tband x10 Standing shoulder D2 with red tband 2x10 Standing marching with alternate shoulder overhead press with 2# dumbbells:  2x10 Intermittent Mechanical cervical traction: 13#/5#  60 sec on/10 off x 10 min   01/25/24 UBE L2 x 6 min 47fwd/3bwd PT present to discuss status Assessed shoulder strength tissue tension in parascapular area, goals and neck ROM Reviewed neck stretches  for rotation and flexion DN education and aftercare Trigger Point Dry Needling Initial Treatment: Pt instructed on Dry Needling rational, procedures, and possible side effects. Pt instructed to expect mild to moderate muscle soreness later in the day and/or into the next day.  Pt instructed in methods to reduce muscle soreness. Pt instructed to continue prescribed  HEP. Because Dry Needling was performed over or adjacent to a lung field, pt was educated on S/S of pneumothorax and to seek immediate medical attention should they occur.  Patient was educated on signs and symptoms of infection and other risk factors and advised to seek medical attention should they occur.  Patient verbalized understanding of these instructions and education.  Patient Verbal Consent Given: Yes Education Handout Provided: Yes Muscles Treated: R UT Electrical Stimulation Performed: No Treatment Response/Outcome: Utilized skilled palpation to identify bony landmarks and trigger points.  Able to illicit twitch response and muscle elongation.  Soft tissue mobilization to muscles needled to further promote tissue elongation and decreased pain.    Manual:  STM to R rhomboids with TPR   01/20/24 UBE L2 x 6 min 75fwd/3bwd PT present to discuss status 3 way scapular stabilization with yellow band x 10 each UE 4D ball rolls x 20 each with yellow plyo ball  Prone wing taps 2 x 10 Prone reach and pull 2 x 10 Seated levator stretch 2x30 sec Intermittent Mechanical cervical traction: 15#/5#  60 sec on/10 off x 15 min                                                                                                                               PATIENT EDUCATION:  Education details: Reviewed previous HEP Person educated: Patient Education method: Explanation, Demonstration, and Verbal cues Education comprehension: verbalized understanding, returned demonstration, and verbal cues required  HOME EXERCISE PROGRAM: Access  Code: [email or web address] URL: [email or web address] Date: 12/02/2023 Prepared by: Delon Haddock  Exercises - Prone Shoulder Extension - Single Arm  - 2 x daily - 7 x weekly - 2 sets - 10 reps - Prone Shoulder Row  - 2 x daily - 7 x weekly - 2 sets - 10 reps - Prone Single Arm Shoulder Horizontal Abduction with Scapular Retraction and Palm Down  - 2 x daily - 7 x weekly - 2 sets - 10 reps - Sidelying Shoulder External Rotation  - 2 x daily - 7 x weekly - 2 sets - 10 reps - Single Arm Serratus Punches in Supine with Dumbbell  - 2 x daily - 7 x weekly - 2 sets - 10 reps - Cat Cow  - 1 x daily - 7 x weekly - 2 sets - 10 reps - Standing Shoulder Flexion to 90 Degrees with Dumbbells  - 2 x daily - 7 x weekly - 2 sets - 10 reps - Standing Shoulder Scaption  - 2 x daily - 7 x weekly - 2 sets - 10 reps - Wall Clock with Theraband  - 1 x daily - 7 x weekly - 2 sets - 5 reps - Neck Mobilization with Foam Roller  - 1 x daily - 7 x weekly - 2 sets - 10 reps - Sidelying Open Book Thoracic Rotation with Knee on Foam Roll  - 1 x daily - 7 x weekly -  1-2 sets - 10 reps - Supine Shoulder External Rotation on Foam Roll with Theraband  - 1 x daily - 7 x weekly - 2 sets - 10 reps - Hooklying Scapular Protraction on Foam Roll  - 1 x daily - 7 x weekly - 2 sets - 10 reps - Seated Assisted Cervical Rotation with Towel  - 1 x daily - 7 x weekly - 1 sets - 10 reps - 2-3 sec hold  ASSESSMENT:  CLINICAL IMPRESSION:  Ms Riebe presents to skilled PT reporting that she is having some increased pain this morning, especially with cervical rotation to the right.  Patient states that she is feeling better, but does not feel that she is where she needs to be and would like to continue PT as she is still not where she would like to be.  Patient presents with decreased right cervical side bend and continued weakness in her right shoulder.  Patient still has pain with end range right shoulder A/ROM.  Patient with Neck  Disability Index of 16%.  Patient has not yet met her long term goals and would benefit from continued skilled PT of 1x/week for 8 additional weeks to further progress towards goal related activities.  OBJECTIVE IMPAIRMENTS: decreased ROM, decreased strength, hypomobility, increased fascial restrictions, increased muscle spasms, impaired UE functional use, postural dysfunction, and pain.   ACTIVITY LIMITATIONS: carrying, lifting, sleeping, transfers, dressing, reach over head, and caring for others  PARTICIPATION LIMITATIONS: meal prep, cleaning, laundry, driving, shopping, community activity, and yard work  PERSONAL FACTORS: None are also affecting patient's functional outcome.   REHAB POTENTIAL: Good  CLINICAL DECISION MAKING: Stable/uncomplicated  EVALUATION COMPLEXITY: Low   GOALS: Goals reviewed with patient? Yes  SHORT TERM GOALS: Target date: 12/30/2023  Patient to report pain no greater than 4/10  Baseline: Goal status:MET   2.  Independence with initial HEP  Baseline:  Goal status: MET  LONG TERM GOALS: Target date: 03/24/2024  Patient to report pain no greater than 2/10 with end range cervical and shoulder A/ROM to allow patient ability to perform functional tasks. Baseline:  Goal status: Ongoing  2.  Patient to be independent with advanced HEP to allow for self progression after discharge. Baseline:  Goal status: Ongoing  3.  Patient to be able to turn head comfortably to be able to back up in her car or see oncoming traffic Baseline:  Goal status: Ongoing (see above)  4.  Patient will improve Neck Disability Index to no greater than 8% (5/10) to demonstrate improvements in functional tasks. Baseline: 16% on 01/27/24 Goal status: Ongoing  5.  Patient to report 75% improvement in overall symptoms  Baseline:  Goal status: Ongoing (reported 50% on 01/25/24)   PLAN:  PT FREQUENCY: 1x/week  PT DURATION: 8 weeks  PLANNED INTERVENTIONS: [id number]- PT  Re-evaluation, 97750- Physical Performance Testing, 97110-Therapeutic exercises, 97530- Therapeutic activity, [id number]- Neuromuscular re-education, 97535- Self Care, [id number]- Manual therapy, [id number]- Gait training, [phone]- Canalith repositioning, [id number]- Aquatic Therapy, [phone]- Electrical stimulation (unattended), [phone]- Electrical stimulation (manual), [id number]- Vasopneumatic device, [id number]- Ultrasound, [id number]- Traction (mechanical), [id number]- Ionotophoresis 4mg /ml Dexamethasone , [id number] (1-2 muscles), [id number] (3+ muscles)- Dry Needling, Patient/Family education, Balance training, Stair training, Taping, Joint mobilization, Joint manipulation, Spinal manipulation, Spinal mobilization, Vestibular training, Cryotherapy, and Moist heat.  PLAN FOR NEXT SESSION: Assess and progress HEP as indicated, strengthening, flexibility, manual/dry needling as indicated    Jarrell Laming, PT, DPT 01/27/2024, 9:53 AM  [facility] [address] [place], [place]  [address] Phone # [phone] Fax [phone]  "

## 2024-02-01 ENCOUNTER — Encounter: Payer: Self-pay | Admitting: Podiatry

## 2024-02-01 ENCOUNTER — Ambulatory Visit: Admitting: Podiatry

## 2024-02-01 ENCOUNTER — Ambulatory Visit (INDEPENDENT_AMBULATORY_CARE_PROVIDER_SITE_OTHER)

## 2024-02-01 DIAGNOSIS — M7751 Other enthesopathy of right foot: Secondary | ICD-10-CM | POA: Diagnosis not present

## 2024-02-01 DIAGNOSIS — M778 Other enthesopathies, not elsewhere classified: Secondary | ICD-10-CM

## 2024-02-01 DIAGNOSIS — M84374A Stress fracture, right foot, initial encounter for fracture: Secondary | ICD-10-CM

## 2024-02-01 NOTE — Progress Notes (Signed)
 Subjective:   Patient ID: Tammy Weeks, female   DOB: 69 y.o.   MRN: [id number]   HPI Patient presents with a lot of pain in the right foot and was concerned about injury with getting ready to go out of town   ROS      Objective:  Physical Exam  Neurovascular status intact with discomfort on the dorsal right foot localized but not 1 specific area mostly across the metatarsal phalangeal joint and proximal     Assessment:  Possibility for inflammatory capsulitis possibility for stress fracture     Plan:  H&P x-ray taken reviewed today I went ahead and recommended ice rigid bottom shoes elevation as needed and if symptoms persist I want to see him back in 2 weeks may require complete immobilization that I educated her on.  Discussed the different types of conditions that may cause this  X-rays were negative for signs of current fracture appears to be more soft tissue currently

## 2024-02-04 ENCOUNTER — Ambulatory Visit

## 2024-02-04 DIAGNOSIS — R252 Cramp and spasm: Secondary | ICD-10-CM

## 2024-02-04 DIAGNOSIS — M5416 Radiculopathy, lumbar region: Secondary | ICD-10-CM

## 2024-02-04 DIAGNOSIS — G8929 Other chronic pain: Secondary | ICD-10-CM

## 2024-02-04 DIAGNOSIS — M6281 Muscle weakness (generalized): Secondary | ICD-10-CM

## 2024-02-04 DIAGNOSIS — R293 Abnormal posture: Secondary | ICD-10-CM

## 2024-02-04 DIAGNOSIS — M542 Cervicalgia: Secondary | ICD-10-CM

## 2024-02-04 DIAGNOSIS — M5459 Other low back pain: Secondary | ICD-10-CM

## 2024-02-04 NOTE — Therapy (Signed)
 " OUTPATIENT PHYSICAL THERAPY TREATMENT NOTE   Patient Name: Tammy Weeks MRN: [id number] DOB:10/16/1955, 69 y.o., female Today's Date: 02/04/2024   END OF SESSION:  PT End of Session - 02/04/24 0806     Visit Number 9    Date for Recertification  03/25/24    Authorization Type Medicare    Progress Note Due on Visit 18    PT Start Time 0802    PT Stop Time 0836    PT Time Calculation (min) 34 min    Activity Tolerance Patient tolerated treatment well    Behavior During Therapy WFL for tasks assessed/performed              Past Medical History:  Diagnosis Date   Anxiety    GERD (gastroesophageal reflux disease)    High cholesterol    Hypertension    Past Surgical History:  Procedure Laterality Date   ABDOMINAL HYSTERECTOMY     ADRENAL GLAND SURGERY     CATARACT EXTRACTION     LAPAROSCOPIC APPENDECTOMY N/A 01/29/2022   Procedure: APPENDECTOMY LAPAROSCOPIC;  Surgeon: Rubin Calamity, MD;  Location: [facility];  Service: General;  Laterality: N/A;   TRIGGER FINGER RELEASE Left 03/05/2021   Procedure: RELEASE TRIGGER FINGER/A-1 PULLEY LEFT RING FINGER;  Surgeon: Murrell Kuba, MD;  Location: [facility];  Service: Orthopedics;  Laterality: Left;  45 MIN   Patient Active Problem List   Diagnosis Date Noted   Hyperlipidemia LDL goal <70 08/11/2023   DDD (degenerative disc disease), cervical 07/18/2022   Lumbosacral spondylosis 05/21/2020   Coughing 01/09/2020   Herpes simplex 07/21/2019   Orbital wrinkles 08/19/2018   Primary osteoarthritis of both knees 07/13/2015   Low grade squamous intraepithelial lesion (LGSIL) on cervicovaginal cytologic smear 03/19/2011    PCP: Ransom Other, MD  REFERRING PROVIDER: Colon Shove, MD  REFERRING DIAG: M50.30 (ICD-10-CM) - DDD (degenerative disc disease), cervical  Rationale for Evaluation and Treatment: Rehabilitation  THERAPY DIAG:  Cervicalgia  Other low back pain  Radiculopathy, lumbar  region  Muscle weakness (generalized)  Cramp and spasm  Abnormal posture  Chronic left shoulder pain  ONSET DATE: 11/16/2023  SUBJECTIVE:                                                                                                                                                                                           SUBJECTIVE STATEMENT: Patient states that she started a weight training class and is feeling better.  Pain 1/10.    PERTINENT HISTORY:  Cervical DDD, lumbosacral spondylosis, knee OA  PAIN:  01/27/2024  Are you having pain? Yes: NPRS scale: 2-3/10  Pain location: neck and left upper trap Pain description: aching Aggravating factors: turning her head as with driving Relieving factors: rest meds heat  PRECAUTIONS: None  RED FLAGS: None   WEIGHT BEARING RESTRICTIONS: No  FALLS:  Has patient fallen in last 6 months? No  LIVING ENVIRONMENT: Lives with: lives with their spouse Lives in: House/apartment  OCCUPATION: Retired  PLOF: Independent, Independent with basic ADLs, Independent with household mobility without device, Independent with community mobility without device, Independent with homemaking with ambulation, Independent with gait, and Independent with transfers  PATIENT GOALS: to reduce neck pain and be able to turn her head safely to drive/back up  NEXT MD VISIT: prn  OBJECTIVE:  Note: Objective measures were completed at Evaluation unless otherwise noted.  DIAGNOSTIC FINDINGS:  Cervical MRI on 10/08/2022: IMPRESSION: 1. Straightening of cervical lordosis with subtle degenerative anterolisthesis of C3 on C4. No acute osseous abnormality. 2. Moderate C4-C5 disc degeneration with a broad-based posterior component, mild mass effect on the ventral right spinal cord, but no spinal stenosis or cord signal abnormality. Associated moderate to severe right neural foraminal stenosis, query right C5 radiculitis. 3. Mild to moderate left C6 neural  foraminal stenosis. Up to mild right C4 and C6 neural foraminal stenosis.  PATIENT SURVEYS:   Eval:  Neck Disability Index (NDI): 7/50 = 14%  01/27/2024:  Neck Disability Index (NDI):  8/50 = 16%   Minimum Detectable Change (90% confidence): 5 points or 10% points  COGNITION: Overall cognitive status: Within functional limits for tasks assessed     SENSATION: WFL   POSTURE: rounded shoulders and forward head  PALPATION: Tight and tender with trigger points right upper trap  CERVICAL ROM:   AROM eval 01/27/24  Flexion WFL 35  Extension WFL 55  Right lateral flexion WFL 35  Left lateral flexion WFL 40  Right rotation WFL 66  Left rotation WFL 65   (Blank rows = not tested)  UPPER  EXTREMITY ROM:     Eval:  WFL  01/27/2024:  WFL, but some pain with right shoulder movement  UPPER EXTREMITY MMT:    Generally 4+ to 5/5 bilateral UE's  CERVICAL SPECIAL TESTS:  Spurlings positive on right  GAIT: Normal  POSTURE:    01/27/2024:  noted right shoulder and scapula more forwardly rotated than the left    TREATMENT DATE:  02/04/2024: UBE level 2.0 x3 min each direction with PT present to discuss status Prone shoulder ext, rows and horizontal abduction 2 x 10 with 2 lb dumbbells Side lying ER 2 x 10 with 2 lb both Supine serratus punch 2 x 10 with 2 lb both Trigger Point Dry Needling Subsequent Treatment: Instructions provided previously at initial dry needling treatment.  Patient Verbal Consent Given: Yes Education Handout Provided: Previously Provided Muscles Treated: bilateral upper traps Electrical Stimulation Performed: No Treatment Response/Outcome: Skilled palpation used to identify taut bands and trigger points.  Once identified, dry needling techniques used to treat these areas.  Twitch response ellicited along with palpable elongation of muscle.  Following treatment, patient reported general soreness.      01/27/2024: UBE level 2.0 x3 min each direction  with PT present to discuss status Neck Disability Index (NDI):  8/50 = 16% Assessment of shoulder/cervical Standing lower trap lift-off 2x10 Standing rows with blue tband 2x10 3 way scapular stabilization with yellow band 2x5 bilat Standing open book with red tband x10 Standing shoulder D2 with red tband 2x10 Standing marching with alternate shoulder overhead press with 2#  dumbbells:  2x10 Intermittent Mechanical cervical traction: 13#/5#  60 sec on/10 off x 10 min   01/25/24 UBE L2 x 6 min 22fwd/3bwd PT present to discuss status Assessed shoulder strength tissue tension in parascapular area, goals and neck ROM Reviewed neck stretches for rotation and flexion DN education and aftercare Trigger Point Dry Needling Initial Treatment: Pt instructed on Dry Needling rational, procedures, and possible side effects. Pt instructed to expect mild to moderate muscle soreness later in the day and/or into the next day.  Pt instructed in methods to reduce muscle soreness. Pt instructed to continue prescribed HEP. Because Dry Needling was performed over or adjacent to a lung field, pt was educated on S/S of pneumothorax and to seek immediate medical attention should they occur.  Patient was educated on signs and symptoms of infection and other risk factors and advised to seek medical attention should they occur.  Patient verbalized understanding of these instructions and education.  Patient Verbal Consent Given: Yes Education Handout Provided: Yes Muscles Treated: R UT Electrical Stimulation Performed: No Treatment Response/Outcome: Utilized skilled palpation to identify bony landmarks and trigger points.  Able to illicit twitch response and muscle elongation.  Soft tissue mobilization to muscles needled to further promote tissue elongation and decreased pain.    Manual:  STM to R rhomboids with TPR   01/20/24 UBE L2 x 6 min 25fwd/3bwd PT present to discuss status 3 way scapular stabilization with  yellow band x 10 each UE 4D ball rolls x 20 each with yellow plyo ball  Prone wing taps 2 x 10 Prone reach and pull 2 x 10 Seated levator stretch 2x30 sec Intermittent Mechanical cervical traction: 15#/5#  60 sec on/10 off x 15 min                                                                                                                               PATIENT EDUCATION:  Education details: Reviewed previous HEP Person educated: Patient Education method: Explanation, Demonstration, and Verbal cues Education comprehension: verbalized understanding, returned demonstration, and verbal cues required  HOME EXERCISE PROGRAM: Access Code: [email or web address] URL: [email or web address] Date: 12/02/2023 Prepared by: Delon Haddock  Exercises - Prone Shoulder Extension - Single Arm  - 2 x daily - 7 x weekly - 2 sets - 10 reps - Prone Shoulder Row  - 2 x daily - 7 x weekly - 2 sets - 10 reps - Prone Single Arm Shoulder Horizontal Abduction with Scapular Retraction and Palm Down  - 2 x daily - 7 x weekly - 2 sets - 10 reps - Sidelying Shoulder External Rotation  - 2 x daily - 7 x weekly - 2 sets - 10 reps - Single Arm Serratus Punches in Supine with Dumbbell  - 2 x daily - 7 x weekly - 2 sets - 10 reps - Cat Cow  - 1 x daily - 7 x weekly - 2 sets - 10 reps - Standing  Shoulder Flexion to 90 Degrees with Dumbbells  - 2 x daily - 7 x weekly - 2 sets - 10 reps - Standing Shoulder Scaption  - 2 x daily - 7 x weekly - 2 sets - 10 reps - Wall Clock with Theraband  - 1 x daily - 7 x weekly - 2 sets - 5 reps - Neck Mobilization with Foam Roller  - 1 x daily - 7 x weekly - 2 sets - 10 reps - Sidelying Open Book Thoracic Rotation with Knee on Foam Roll  - 1 x daily - 7 x weekly - 1-2 sets - 10 reps - Supine Shoulder External Rotation on Foam Roll with Theraband  - 1 x daily - 7 x weekly - 2 sets - 10 reps - Hooklying Scapular Protraction on Foam Roll  - 1 x daily - 7 x weekly - 2 sets - 10  reps - Seated Assisted Cervical Rotation with Towel  - 1 x daily - 7 x weekly - 1 sets - 10 reps - 2-3 sec hold  ASSESSMENT:  CLINICAL IMPRESSION:  Aniqua is responding well to adding in upper body strength at the gym.  She was able to do all exercises with 2 lbs with ease today.  She would benefit from continued skilled PT to further progress towards goal related activities.  OBJECTIVE IMPAIRMENTS: decreased ROM, decreased strength, hypomobility, increased fascial restrictions, increased muscle spasms, impaired UE functional use, postural dysfunction, and pain.   ACTIVITY LIMITATIONS: carrying, lifting, sleeping, transfers, dressing, reach over head, and caring for others  PARTICIPATION LIMITATIONS: meal prep, cleaning, laundry, driving, shopping, community activity, and yard work  PERSONAL FACTORS: None are also affecting patient's functional outcome.   REHAB POTENTIAL: Good  CLINICAL DECISION MAKING: Stable/uncomplicated  EVALUATION COMPLEXITY: Low   GOALS: Goals reviewed with patient? Yes  SHORT TERM GOALS: Target date: 12/30/2023  Patient to report pain no greater than 4/10  Baseline: Goal status:MET   2.  Independence with initial HEP  Baseline:  Goal status: MET  LONG TERM GOALS: Target date: 03/24/2024  Patient to report pain no greater than 2/10 with end range cervical and shoulder A/ROM to allow patient ability to perform functional tasks. Baseline:  Goal status: Ongoing  2.  Patient to be independent with advanced HEP to allow for self progression after discharge. Baseline:  Goal status: Ongoing  3.  Patient to be able to turn head comfortably to be able to back up in her car or see oncoming traffic Baseline:  Goal status: Ongoing (see above)  4.  Patient will improve Neck Disability Index to no greater than 8% (5/10) to demonstrate improvements in functional tasks. Baseline: 16% on 01/27/24 Goal status: Ongoing  5.  Patient to report 75% improvement  in overall symptoms  Baseline:  Goal status: Ongoing (reported 50% on 01/25/24)   PLAN:  PT FREQUENCY: 1x/week  PT DURATION: 8 weeks  PLANNED INTERVENTIONS: [id number]- PT Re-evaluation, 97750- Physical Performance Testing, 97110-Therapeutic exercises, 97530- Therapeutic activity, [id number]- Neuromuscular re-education, 97535- Self Care, [id number]- Manual therapy, [id number]- Gait training, [phone]- Canalith repositioning, [id number]- Aquatic Therapy, [phone]- Electrical stimulation (unattended), [phone]- Electrical stimulation (manual), [id number]- Vasopneumatic device, [id number]- Ultrasound, [id number]- Traction (mechanical), [id number]- Ionotophoresis 4mg /ml Dexamethasone , [id number] (1-2 muscles), [id number] (3+ muscles)- Dry Needling, Patient/Family education, Balance training, Stair training, Taping, Joint mobilization, Joint manipulation, Spinal manipulation, Spinal mobilization, Vestibular training, Cryotherapy, and Moist heat.  PLAN FOR NEXT SESSION: Assess response to DN,  Traction or DN each week depending on symptoms. Assess  and progress HEP as indicated, strengthening, flexibility, manual/dry needling as indicated    Abigial Newville B. Latrice Storlie, PT 02/04/24 8:48 AM [facility] [address] [place], [place] [address] Phone # [phone] Fax [phone]  "

## 2024-02-10 ENCOUNTER — Ambulatory Visit

## 2024-02-10 DIAGNOSIS — G8929 Other chronic pain: Secondary | ICD-10-CM

## 2024-02-10 DIAGNOSIS — M542 Cervicalgia: Secondary | ICD-10-CM

## 2024-02-10 DIAGNOSIS — M6281 Muscle weakness (generalized): Secondary | ICD-10-CM

## 2024-02-10 DIAGNOSIS — M5459 Other low back pain: Secondary | ICD-10-CM

## 2024-02-10 NOTE — Therapy (Signed)
 " OUTPATIENT PHYSICAL THERAPY TREATMENT NOTE  Progress Note Reporting Period 12/02/23 to 1/2//25  See note below for Objective Data and Assessment of Progress/Goals.      Patient Name: Tammy Weeks MRN: [id number] DOB:[birth year], 69 y.o., female Today's Date: 02/10/2024   END OF SESSION:  PT End of Session - 02/10/24 1200     Visit Number 10    Date for Recertification  03/25/24    Authorization Type Medicare    Progress Note Due on Visit 20    PT Start Time 1147    PT Stop Time 1212    PT Time Calculation (min) 25 min    Activity Tolerance Patient tolerated treatment well    Behavior During Therapy WFL for tasks assessed/performed              Past Medical History:  Diagnosis Date   Anxiety    GERD (gastroesophageal reflux disease)    High cholesterol    Hypertension    Past Surgical History:  Procedure Laterality Date   ABDOMINAL HYSTERECTOMY     ADRENAL GLAND SURGERY     CATARACT EXTRACTION     LAPAROSCOPIC APPENDECTOMY N/A 01/29/2022   Procedure: APPENDECTOMY LAPAROSCOPIC;  Surgeon: Rubin Calamity, MD;  Location: [facility];  Service: General;  Laterality: N/A;   TRIGGER FINGER RELEASE Left 03/05/2021   Procedure: RELEASE TRIGGER FINGER/A-1 PULLEY LEFT RING FINGER;  Surgeon: Murrell Kuba, MD;  Location: [facility];  Service: Orthopedics;  Laterality: Left;  45 MIN   Patient Active Problem List   Diagnosis Date Noted   Hyperlipidemia LDL goal <70 08/11/2023   DDD (degenerative disc disease), cervical 07/18/2022   Lumbosacral spondylosis 05/21/2020   Coughing 01/09/2020   Herpes simplex 07/21/2019   Orbital wrinkles 08/19/2018   Primary osteoarthritis of both knees 07/13/2015   Low grade squamous intraepithelial lesion (LGSIL) on cervicovaginal cytologic smear 03/19/2011    PCP: Ransom Other, MD  REFERRING PROVIDER: Colon Shove, MD  REFERRING DIAG: M50.30 (ICD-10-CM) - DDD (degenerative disc disease), cervical  Rationale for  Evaluation and Treatment: Rehabilitation  THERAPY DIAG:  Cervicalgia  Other low back pain  Muscle weakness (generalized)  Chronic left shoulder pain  Chronic right shoulder pain  ONSET DATE: 11/16/2023  SUBJECTIVE:                                                                                                                                                                                           SUBJECTIVE STATEMENT: Patient states that the weight training is really helping.  I'm just really tight and sore in my right shoulder today.   Pain 1/10.  PERTINENT HISTORY:  Cervical DDD, lumbosacral spondylosis, knee OA  PAIN:  02/10/2024  Are you having pain? Yes: NPRS scale: 1/10 Pain location: neck and left upper trap Pain description: aching Aggravating factors: turning her head as with driving Relieving factors: rest meds heat  PRECAUTIONS: None  RED FLAGS: None   WEIGHT BEARING RESTRICTIONS: No  FALLS:  Has patient fallen in last 6 months? No  LIVING ENVIRONMENT: Lives with: lives with their spouse Lives in: House/apartment  OCCUPATION: Retired  PLOF: Independent, Independent with basic ADLs, Independent with household mobility without device, Independent with community mobility without device, Independent with homemaking with ambulation, Independent with gait, and Independent with transfers  PATIENT GOALS: to reduce neck pain and be able to turn her head safely to drive/back up  NEXT MD VISIT: prn  OBJECTIVE:  Note: Objective measures were completed at Evaluation unless otherwise noted.  DIAGNOSTIC FINDINGS:  Cervical MRI on 10/08/2022: IMPRESSION: 1. Straightening of cervical lordosis with subtle degenerative anterolisthesis of C3 on C4. No acute osseous abnormality. 2. Moderate C4-C5 disc degeneration with a broad-based posterior component, mild mass effect on the ventral right spinal cord, but no spinal stenosis or cord signal abnormality.  Associated moderate to severe right neural foraminal stenosis, query right C5 radiculitis. 3. Mild to moderate left C6 neural foraminal stenosis. Up to mild right C4 and C6 neural foraminal stenosis.  PATIENT SURVEYS:   Eval:  Neck Disability Index (NDI): 7/50 = 14%  01/27/2024:  Neck Disability Index (NDI):  8/50 = 16%   Minimum Detectable Change (90% confidence): 5 points or 10% points  COGNITION: Overall cognitive status: Within functional limits for tasks assessed     SENSATION: WFL   POSTURE: rounded shoulders and forward head  PALPATION: Tight and tender with trigger points right upper trap  CERVICAL ROM:   AROM eval 01/27/24  Flexion WFL 35  Extension WFL 55  Right lateral flexion WFL 35  Left lateral flexion WFL 40  Right rotation WFL 66  Left rotation WFL 65   (Blank rows = not tested)  UPPER  EXTREMITY ROM:     Eval:  WFL  01/27/2024:  WFL, but some pain with right shoulder movement  UPPER EXTREMITY MMT:    Generally 4+ to 5/5 bilateral UE's  CERVICAL SPECIAL TESTS:  Spurlings positive on right  GAIT: Normal  POSTURE:    01/27/2024:  noted right shoulder and scapula more forwardly rotated than the left    TREATMENT DATE:  02/10/2024: UBE level 2.0 x3 min each direction with PT present to discuss status 10th visit progress note: see objective findings from 01/27/24 3 way scapular stabilization with blue loop x 10 each side 4D ball rolls with 2lb plyo ball x 20 each direction on each UE Standing row 2 x 10 with 30 lbs at upper body matrix Lat pull down 2 x 10 with 25 lbs (standing) Trigger Point Dry Needling Subsequent Treatment: Instructions provided previously at initial dry needling treatment.  Patient Verbal Consent Given: Yes Education Handout Provided: Previously Provided Muscles Treated: bilateral upper traps Electrical Stimulation Performed: No Treatment Response/Outcome: Skilled palpation used to identify taut bands and trigger  points.  Once identified, dry needling techniques used to treat these areas.  Twitch response ellicited along with palpable elongation of muscle.  Following treatment, patient reported minimal soreness and relief of tension in the right shoulder.    02/04/2024: UBE level 2.0 x3 min each direction with PT present to discuss status Prone shoulder ext, rows  and horizontal abduction 2 x 10 with 2 lb dumbbells Side lying ER 2 x 10 with 2 lb both Supine serratus punch 2 x 10 with 2 lb both Trigger Point Dry Needling Subsequent Treatment: Instructions provided previously at initial dry needling treatment.  Patient Verbal Consent Given: Yes Education Handout Provided: Previously Provided Muscles Treated: bilateral upper traps Electrical Stimulation Performed: No Treatment Response/Outcome: Skilled palpation used to identify taut bands and trigger points.  Once identified, dry needling techniques used to treat these areas.  Twitch response ellicited along with palpable elongation of muscle.  Following treatment, patient reported general soreness.      01/27/2024: UBE level 2.0 x3 min each direction with PT present to discuss status Neck Disability Index (NDI):  8/50 = 16% Assessment of shoulder/cervical Standing lower trap lift-off 2x10 Standing rows with blue tband 2x10 3 way scapular stabilization with yellow band 2x5 bilat Standing open book with red tband x10 Standing shoulder D2 with red tband 2x10 Standing marching with alternate shoulder overhead press with 2# dumbbells:  2x10 Intermittent Mechanical cervical traction: 13#/5#  60 sec on/10 off x 10 min     PATIENT EDUCATION:  Education details: Reviewed previous HEP Person educated: Patient Education method: Explanation, Demonstration, and Verbal cues Education comprehension: verbalized understanding, returned demonstration, and verbal cues required  HOME EXERCISE PROGRAM: Access Code: [email or web address] URL:  [email or web address] Date: 12/02/2023 Prepared by: Delon Haddock  Exercises - Prone Shoulder Extension - Single Arm  - 2 x daily - 7 x weekly - 2 sets - 10 reps - Prone Shoulder Row  - 2 x daily - 7 x weekly - 2 sets - 10 reps - Prone Single Arm Shoulder Horizontal Abduction with Scapular Retraction and Palm Down  - 2 x daily - 7 x weekly - 2 sets - 10 reps - Sidelying Shoulder External Rotation  - 2 x daily - 7 x weekly - 2 sets - 10 reps - Single Arm Serratus Punches in Supine with Dumbbell  - 2 x daily - 7 x weekly - 2 sets - 10 reps - Cat Cow  - 1 x daily - 7 x weekly - 2 sets - 10 reps - Standing Shoulder Flexion to 90 Degrees with Dumbbells  - 2 x daily - 7 x weekly - 2 sets - 10 reps - Standing Shoulder Scaption  - 2 x daily - 7 x weekly - 2 sets - 10 reps - Wall Clock with Theraband  - 1 x daily - 7 x weekly - 2 sets - 5 reps - Neck Mobilization with Foam Roller  - 1 x daily - 7 x weekly - 2 sets - 10 reps - Sidelying Open Book Thoracic Rotation with Knee on Foam Roll  - 1 x daily - 7 x weekly - 1-2 sets - 10 reps - Supine Shoulder External Rotation on Foam Roll with Theraband  - 1 x daily - 7 x weekly - 2 sets - 10 reps - Hooklying Scapular Protraction on Foam Roll  - 1 x daily - 7 x weekly - 2 sets - 10 reps - Seated Assisted Cervical Rotation with Towel  - 1 x daily - 7 x weekly - 1 sets - 10 reps - 2-3 sec hold  ASSESSMENT:  CLINICAL IMPRESSION: Majel is progressing appropriately.  She is responding well to her strengthening class but still gets occasional right and left shoulder issues is she is doing repeated shoulder level to overhead level activity.  She is well motivated  and compliant.  We did needling again today as she was experiencing right and left upper trap tightness and soreness due to shoveling snow.  Her objective findings are improving.  She should continue to improve.  Her goal is to avoid C spine surgery or need for further injections.     OBJECTIVE IMPAIRMENTS: decreased ROM, decreased strength, hypomobility, increased fascial restrictions, increased muscle spasms, impaired UE functional use, postural dysfunction, and pain.   ACTIVITY LIMITATIONS: carrying, lifting, sleeping, transfers, dressing, reach over head, and caring for others  PARTICIPATION LIMITATIONS: meal prep, cleaning, laundry, driving, shopping, community activity, and yard work  PERSONAL FACTORS: None are also affecting patient's functional outcome.   REHAB POTENTIAL: Good  CLINICAL DECISION MAKING: Stable/uncomplicated  EVALUATION COMPLEXITY: Low   GOALS: Goals reviewed with patient? Yes  SHORT TERM GOALS: Target date: 12/30/2023  Patient to report pain no greater than 4/10  Baseline: Goal status:MET   2.  Independence with initial HEP  Baseline:  Goal status: MET  LONG TERM GOALS: Target date: 03/24/2024  Patient to report pain no greater than 2/10 with end range cervical and shoulder A/ROM to allow patient ability to perform functional tasks. Baseline:  Goal status: MET 02/10/24  2.  Patient to be independent with advanced HEP to allow for self progression after discharge. Baseline:  Goal status: MET 02/10/24  3.  Patient to be able to turn head comfortably to be able to back up in her car or see oncoming traffic Baseline:  Goal status: Ongoing (see above)  4.  Patient will improve Neck Disability Index to no greater than 8% (5/10) to demonstrate improvements in functional tasks. Baseline: 16% on 01/27/24 Goal status: Ongoing  5.  Patient to report 75% improvement in overall symptoms  Baseline:  Goal status: Ongoing (reported 50% on 01/25/24)   PLAN:  PT FREQUENCY: 1x/week  PT DURATION: 8 weeks  PLANNED INTERVENTIONS: [id number]- PT Re-evaluation, 97750- Physical Performance Testing, 97110-Therapeutic exercises, 97530- Therapeutic activity, [id number]- Neuromuscular re-education, 97535- Self Care, [id number]- Manual therapy, [id number]- Gait  training, [phone]- Canalith repositioning, [id number]- Aquatic Therapy, [phone]- Electrical stimulation (unattended), [phone]- Electrical stimulation (manual), [id number]- Vasopneumatic device, [id number]- Ultrasound, [id number]- Traction (mechanical), [id number]- Ionotophoresis 4mg /ml Dexamethasone , [id number] (1-2 muscles), [id number] (3+ muscles)- Dry Needling, Patient/Family education, Balance training, Stair training, Taping, Joint mobilization, Joint manipulation, Spinal manipulation, Spinal mobilization, Vestibular training, Cryotherapy, and Moist heat.  PLAN FOR NEXT SESSION: Assess response to DN,  Traction or DN each week depending on symptoms. Assess and progress HEP as indicated, strengthening, flexibility, manual/dry needling as indicated    Berlin Mokry B. Nedim Oki, PT 02/10/24 12:30 PM [facility] [address] [place], [place] [address] Phone # [phone] Fax [phone]  "

## 2024-02-19 ENCOUNTER — Ambulatory Visit: Attending: Neurological Surgery

## 2024-02-19 DIAGNOSIS — M542 Cervicalgia: Secondary | ICD-10-CM

## 2024-02-19 DIAGNOSIS — M5459 Other low back pain: Secondary | ICD-10-CM

## 2024-02-19 DIAGNOSIS — M6281 Muscle weakness (generalized): Secondary | ICD-10-CM

## 2024-02-19 DIAGNOSIS — G8929 Other chronic pain: Secondary | ICD-10-CM

## 2024-02-19 DIAGNOSIS — R252 Cramp and spasm: Secondary | ICD-10-CM

## 2024-02-19 DIAGNOSIS — R293 Abnormal posture: Secondary | ICD-10-CM

## 2024-02-19 NOTE — Therapy (Signed)
 " OUTPATIENT PHYSICAL THERAPY TREATMENT NOTE      Patient Name: Tammy Weeks MRN: [id number] DOB:[birth year], 69 y.o., female Today's Date: 02/19/2024   END OF SESSION:  PT End of Session - 02/19/24 0816     Visit Number 11    Date for Recertification  03/25/24    Authorization Type Medicare    Progress Note Due on Visit 20    PT Start Time 0800    PT Stop Time 0852    PT Time Calculation (min) 52 min    Activity Tolerance Patient tolerated treatment well    Behavior During Therapy WFL for tasks assessed/performed              Past Medical History:  Diagnosis Date   Anxiety    GERD (gastroesophageal reflux disease)    High cholesterol    Hypertension    Past Surgical History:  Procedure Laterality Date   ABDOMINAL HYSTERECTOMY     ADRENAL GLAND SURGERY     CATARACT EXTRACTION     LAPAROSCOPIC APPENDECTOMY N/A 01/29/2022   Procedure: APPENDECTOMY LAPAROSCOPIC;  Surgeon: Rubin Calamity, MD;  Location: [facility];  Service: General;  Laterality: N/A;   TRIGGER FINGER RELEASE Left 03/05/2021   Procedure: RELEASE TRIGGER FINGER/A-1 PULLEY LEFT RING FINGER;  Surgeon: Murrell Kuba, MD;  Location: [facility];  Service: Orthopedics;  Laterality: Left;  45 MIN   Patient Active Problem List   Diagnosis Date Noted   Hyperlipidemia LDL goal <70 08/11/2023   DDD (degenerative disc disease), cervical 07/18/2022   Lumbosacral spondylosis 05/21/2020   Coughing 01/09/2020   Herpes simplex 07/21/2019   Orbital wrinkles 08/19/2018   Primary osteoarthritis of both knees 07/13/2015   Low grade squamous intraepithelial lesion (LGSIL) on cervicovaginal cytologic smear 03/19/2011    PCP: Ransom Other, MD  REFERRING PROVIDER: Colon Shove, MD  REFERRING DIAG: M50.30 (ICD-10-CM) - DDD (degenerative disc disease), cervical  Rationale for Evaluation and Treatment: Rehabilitation  THERAPY DIAG:  Cervicalgia  Other low back pain  Muscle weakness  (generalized)  Chronic left shoulder pain  Chronic right shoulder pain  Cramp and spasm  Abnormal posture  ONSET DATE: 11/16/2023  SUBJECTIVE:                                                                                                                                                                                           SUBJECTIVE STATEMENT: Patient states she had a little pain from shoveling snow but overall, still doing really well.   PERTINENT HISTORY:  Cervical DDD, lumbosacral spondylosis, knee OA  PAIN:  02/19/2024  Are you having pain? Yes:  NPRS scale: 1/10 Pain location: neck and left upper trap Pain description: aching Aggravating factors: turning her head as with driving Relieving factors: rest meds heat  PRECAUTIONS: None  RED FLAGS: None   WEIGHT BEARING RESTRICTIONS: No  FALLS:  Has patient fallen in last 6 months? No  LIVING ENVIRONMENT: Lives with: lives with their spouse Lives in: House/apartment  OCCUPATION: Retired  PLOF: Independent, Independent with basic ADLs, Independent with household mobility without device, Independent with community mobility without device, Independent with homemaking with ambulation, Independent with gait, and Independent with transfers  PATIENT GOALS: to reduce neck pain and be able to turn her head safely to drive/back up  NEXT MD VISIT: prn  OBJECTIVE:  Note: Objective measures were completed at Evaluation unless otherwise noted.  DIAGNOSTIC FINDINGS:  Cervical MRI on 10/08/2022: IMPRESSION: 1. Straightening of cervical lordosis with subtle degenerative anterolisthesis of C3 on C4. No acute osseous abnormality. 2. Moderate C4-C5 disc degeneration with a broad-based posterior component, mild mass effect on the ventral right spinal cord, but no spinal stenosis or cord signal abnormality. Associated moderate to severe right neural foraminal stenosis, query right C5 radiculitis. 3. Mild to moderate left C6  neural foraminal stenosis. Up to mild right C4 and C6 neural foraminal stenosis.  PATIENT SURVEYS:   Eval:  Neck Disability Index (NDI): 7/50 = 14%  01/27/2024:  Neck Disability Index (NDI):  8/50 = 16%   Minimum Detectable Change (90% confidence): 5 points or 10% points  COGNITION: Overall cognitive status: Within functional limits for tasks assessed     SENSATION: WFL   POSTURE: rounded shoulders and forward head  PALPATION: Tight and tender with trigger points right upper trap  CERVICAL ROM:   AROM eval 01/27/24  Flexion WFL 35  Extension WFL 55  Right lateral flexion WFL 35  Left lateral flexion WFL 40  Right rotation WFL 66  Left rotation WFL 65   (Blank rows = not tested)  UPPER  EXTREMITY ROM:     Eval:  WFL  01/27/2024:  WFL, but some pain with right shoulder movement  UPPER EXTREMITY MMT:    Generally 4+ to 5/5 bilateral UE's  CERVICAL SPECIAL TESTS:  Spurlings positive on right  GAIT: Normal  POSTURE:    01/27/2024:  noted right shoulder and scapula more forwardly rotated than the left    TREATMENT DATE:  02/19/2024: UBE level 2.0 x3 min each direction with PT present to discuss status 3 way scapular stabilization with blue loop x 10 each side 4D ball rolls with 2lb plyo ball x 20 each direction on each UE Prone wing taps 2 x 10 Prone reach and pull 2 x 10 Cervical mechanical traction x 15 min: 12 lbs, 60 sec hold, 10 sec rest  02/10/2024: UBE level 2.0 x3 min each direction with PT present to discuss status 10th visit progress note: see objective findings from 01/27/24 3 way scapular stabilization with blue loop x 10 each side 4D ball rolls with 2lb plyo ball x 20 each direction on each UE Standing row 2 x 10 with 30 lbs at upper body matrix Lat pull down 2 x 10 with 25 lbs (standing) Trigger Point Dry Needling Subsequent Treatment: Instructions provided previously at initial dry needling treatment.  Patient Verbal Consent Given:  Yes Education Handout Provided: Previously Provided Muscles Treated: bilateral upper traps Electrical Stimulation Performed: No Treatment Response/Outcome: Skilled palpation used to identify taut bands and trigger points.  Once identified, dry needling techniques used to treat  these areas.  Twitch response ellicited along with palpable elongation of muscle.  Following treatment, patient reported minimal soreness and relief of tension in the right shoulder.    02/04/2024: UBE level 2.0 x3 min each direction with PT present to discuss status Prone shoulder ext, rows and horizontal abduction 2 x 10 with 2 lb dumbbells Side lying ER 2 x 10 with 2 lb both Supine serratus punch 2 x 10 with 2 lb both Trigger Point Dry Needling Subsequent Treatment: Instructions provided previously at initial dry needling treatment.  Patient Verbal Consent Given: Yes Education Handout Provided: Previously Provided Muscles Treated: bilateral upper traps Electrical Stimulation Performed: No Treatment Response/Outcome: Skilled palpation used to identify taut bands and trigger points.  Once identified, dry needling techniques used to treat these areas.  Twitch response ellicited along with palpable elongation of muscle.  Following treatment, patient reported general soreness.      01/27/2024: UBE level 2.0 x3 min each direction with PT present to discuss status Neck Disability Index (NDI):  8/50 = 16% Assessment of shoulder/cervical Standing lower trap lift-off 2x10 Standing rows with blue tband 2x10 3 way scapular stabilization with yellow band 2x5 bilat Standing open book with red tband x10 Standing shoulder D2 with red tband 2x10 Standing marching with alternate shoulder overhead press with 2# dumbbells:  2x10 Intermittent Mechanical cervical traction: 13#/5#  60 sec on/10 off x 10 min     PATIENT EDUCATION:  Education details: Reviewed previous HEP Person educated: Patient Education method: Explanation,  Demonstration, and Verbal cues Education comprehension: verbalized understanding, returned demonstration, and verbal cues required  HOME EXERCISE PROGRAM: Access Code: [email or web address] URL: [email or web address] Date: 12/02/2023 Prepared by: Delon Haddock  Exercises - Prone Shoulder Extension - Single Arm  - 2 x daily - 7 x weekly - 2 sets - 10 reps - Prone Shoulder Row  - 2 x daily - 7 x weekly - 2 sets - 10 reps - Prone Single Arm Shoulder Horizontal Abduction with Scapular Retraction and Palm Down  - 2 x daily - 7 x weekly - 2 sets - 10 reps - Sidelying Shoulder External Rotation  - 2 x daily - 7 x weekly - 2 sets - 10 reps - Single Arm Serratus Punches in Supine with Dumbbell  - 2 x daily - 7 x weekly - 2 sets - 10 reps - Cat Cow  - 1 x daily - 7 x weekly - 2 sets - 10 reps - Standing Shoulder Flexion to 90 Degrees with Dumbbells  - 2 x daily - 7 x weekly - 2 sets - 10 reps - Standing Shoulder Scaption  - 2 x daily - 7 x weekly - 2 sets - 10 reps - Wall Clock with Theraband  - 1 x daily - 7 x weekly - 2 sets - 5 reps - Neck Mobilization with Foam Roller  - 1 x daily - 7 x weekly - 2 sets - 10 reps - Sidelying Open Book Thoracic Rotation with Knee on Foam Roll  - 1 x daily - 7 x weekly - 1-2 sets - 10 reps - Supine Shoulder External Rotation on Foam Roll with Theraband  - 1 x daily - 7 x weekly - 2 sets - 10 reps - Hooklying Scapular Protraction on Foam Roll  - 1 x daily - 7 x weekly - 2 sets - 10 reps - Seated Assisted Cervical Rotation with Towel  - 1 x daily - 7 x weekly - 1 sets - 10  reps - 2-3 sec hold  ASSESSMENT:  CLINICAL IMPRESSION: Aryanah was still having some soreness from shoveling but this did not seem to have worsened.  She is doing well with her gym classes and making good progress with her upper body strengthening.  Her goal is to avoid C spine surgery or need for further injections.  She seems to respond well to a combination of postural strengthening along  with DN and traction.   OBJECTIVE IMPAIRMENTS: decreased ROM, decreased strength, hypomobility, increased fascial restrictions, increased muscle spasms, impaired UE functional use, postural dysfunction, and pain.   ACTIVITY LIMITATIONS: carrying, lifting, sleeping, transfers, dressing, reach over head, and caring for others  PARTICIPATION LIMITATIONS: meal prep, cleaning, laundry, driving, shopping, community activity, and yard work  PERSONAL FACTORS: None are also affecting patient's functional outcome.   REHAB POTENTIAL: Good  CLINICAL DECISION MAKING: Stable/uncomplicated  EVALUATION COMPLEXITY: Low   GOALS: Goals reviewed with patient? Yes  SHORT TERM GOALS: Target date: 12/30/2023  Patient to report pain no greater than 4/10  Baseline: Goal status:MET   2.  Independence with initial HEP  Baseline:  Goal status: MET  LONG TERM GOALS: Target date: 03/24/2024  Patient to report pain no greater than 2/10 with end range cervical and shoulder A/ROM to allow patient ability to perform functional tasks. Baseline:  Goal status: MET 02/10/24  2.  Patient to be independent with advanced HEP to allow for self progression after discharge. Baseline:  Goal status: MET 02/10/24  3.  Patient to be able to turn head comfortably to be able to back up in her car or see oncoming traffic Baseline:  Goal status: Ongoing (see above)  4.  Patient will improve Neck Disability Index to no greater than 8% (5/10) to demonstrate improvements in functional tasks. Baseline: 16% on 01/27/24 Goal status: Ongoing  5.  Patient to report 75% improvement in overall symptoms  Baseline:  Goal status: Ongoing (reported 50% on 01/25/24)   PLAN:  PT FREQUENCY: 1x/week  PT DURATION: 8 weeks  PLANNED INTERVENTIONS: 97164- PT Re-evaluation, 97750- Physical Performance Testing, 97110-Therapeutic exercises, 97530- Therapeutic activity, [id number]- Neuromuscular re-education, 97535- Self Care, [id number]- Manual  therapy, [id number]- Gait training, [phone]- Canalith repositioning, [id number]- Aquatic Therapy, [phone]- Electrical stimulation (unattended), [phone]- Electrical stimulation (manual), [id number]- Vasopneumatic device, [id number]- Ultrasound, [id number]- Traction (mechanical), [id number]- Ionotophoresis 4mg /ml Dexamethasone , [id number] (1-2 muscles), [id number] (3+ muscles)- Dry Needling, Patient/Family education, Balance training, Stair training, Taping, Joint mobilization, Joint manipulation, Spinal manipulation, Spinal mobilization, Vestibular training, Cryotherapy, and Moist heat.  PLAN FOR NEXT SESSION:  Traction or DN each week depending on symptoms. Assess and progress HEP as indicated, strengthening, flexibility, manual/dry needling as indicated    Jarrad Mclees B. Ronelle Smallman, PT 02/19/24 8:50 AM [facility] [address] [place], [place] [address] Phone # [phone] Fax [phone]  "

## 2024-02-25 ENCOUNTER — Ambulatory Visit: Admitting: Physical Therapy

## 2024-03-02 ENCOUNTER — Ambulatory Visit

## 2024-04-01 IMAGING — CT CT CHEST W/O CM
2 of 5 series · 15 of 36 positions shown, 18 images · non-contrast
Comparison: CT 12/16/2019

CLINICAL DATA: Follow-up lung nodule



[Series 4: chest 2.00 br40 s3 · coronal · 0.53mm/px · 3 of 142 slices shown]
[im 29/142  lung]
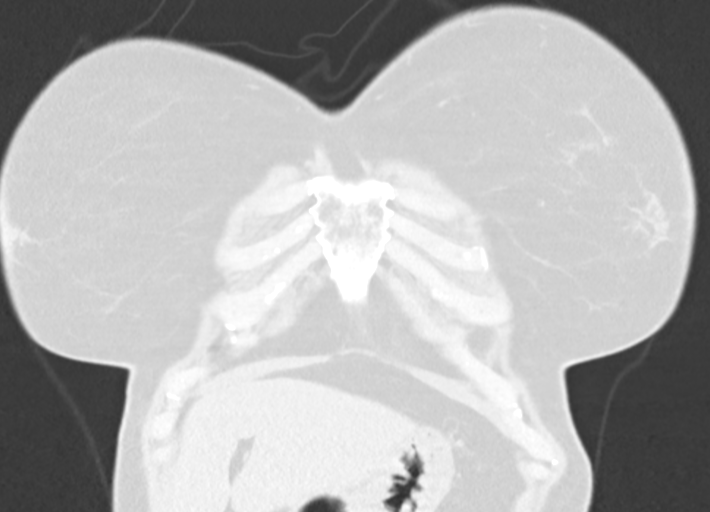
[im 57/142  lung]
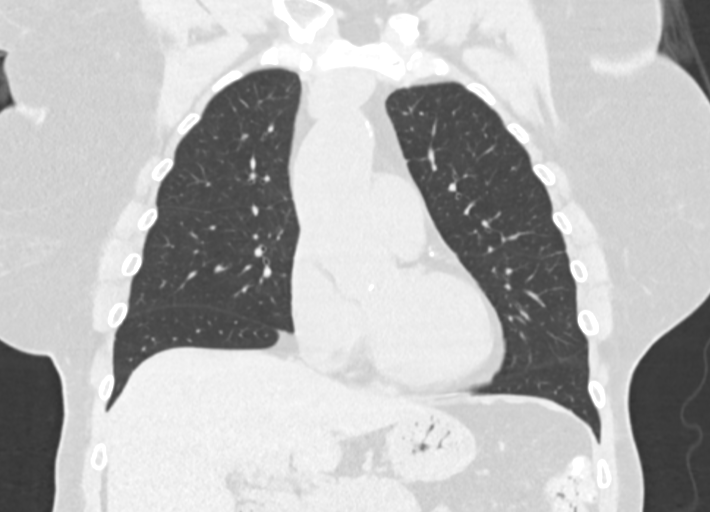
[im 85/142  lung]
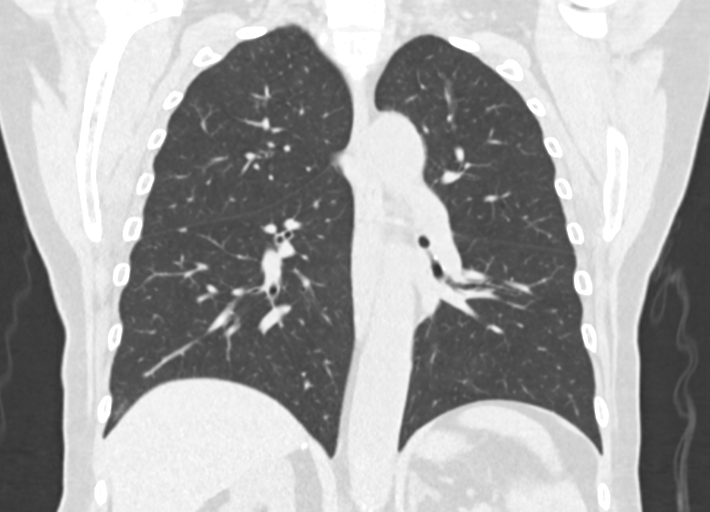

[Series 10: chest 1.00 br40 s3 super d · axial · 0.73mm/px · z∈[+1586,+1818]mm · 12 of 335 slices shown, 15 images]
[im 23/335  mediastinal]
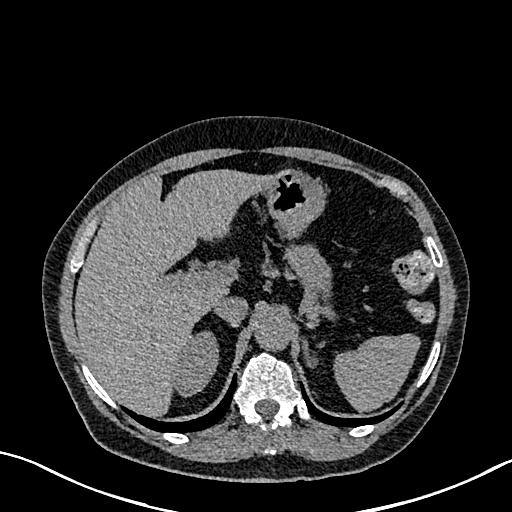
[im 23/335  lung]
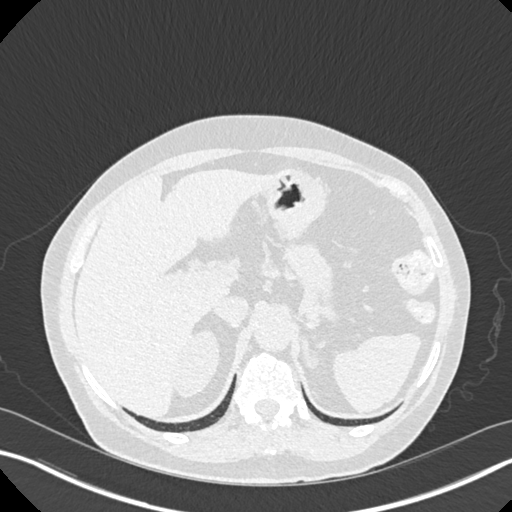
[im 45/335  lung]
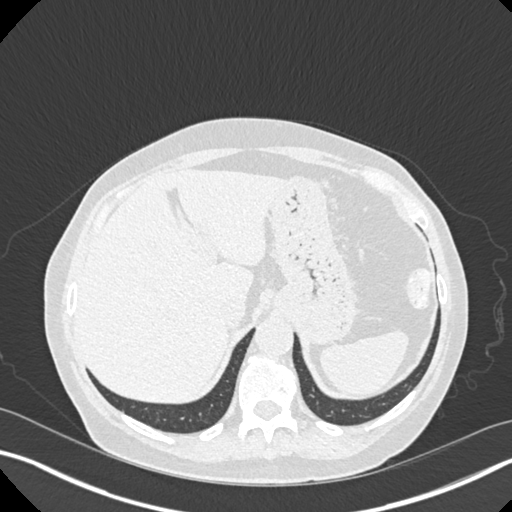
[im 67/335  lung]
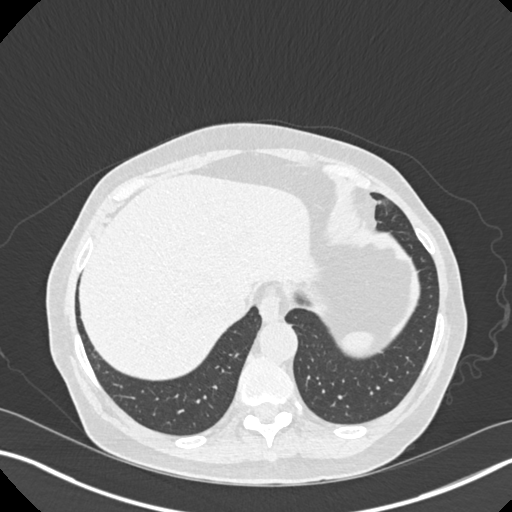
[im 112/335  lung]
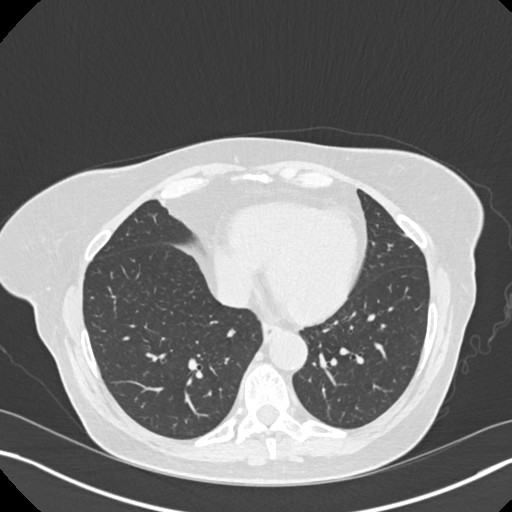
[im 134/335  mediastinal]
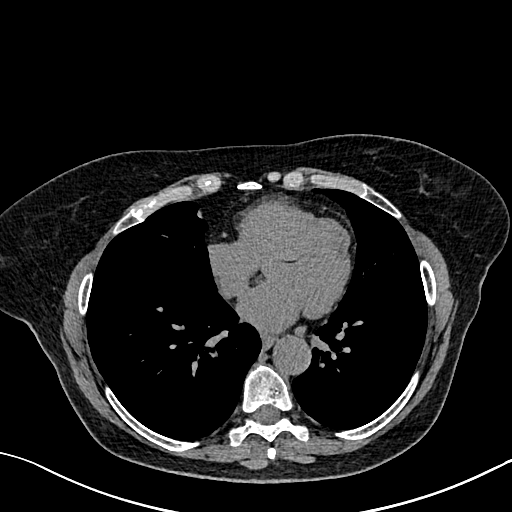
[im 134/335  lung]
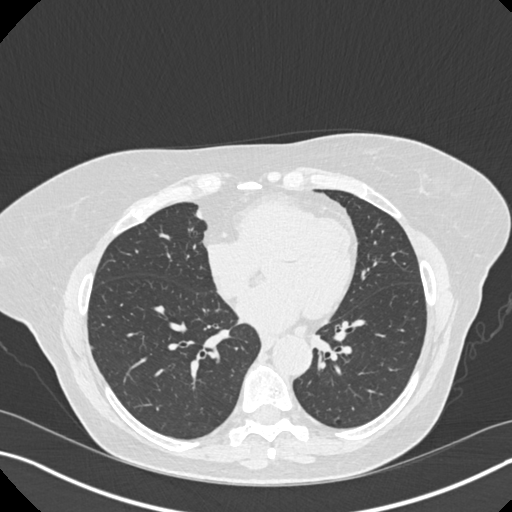
[im 156/335  lung]
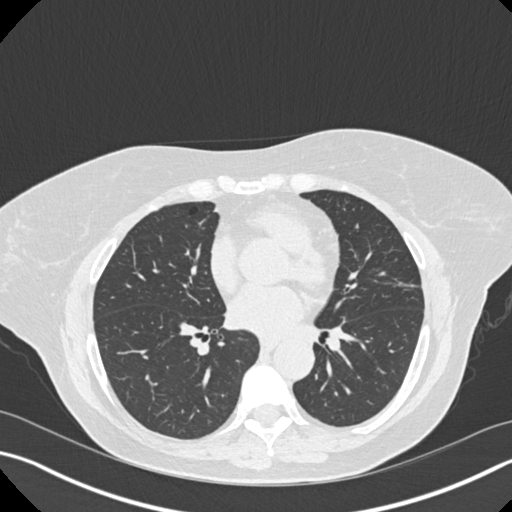
[im 179/335  lung]
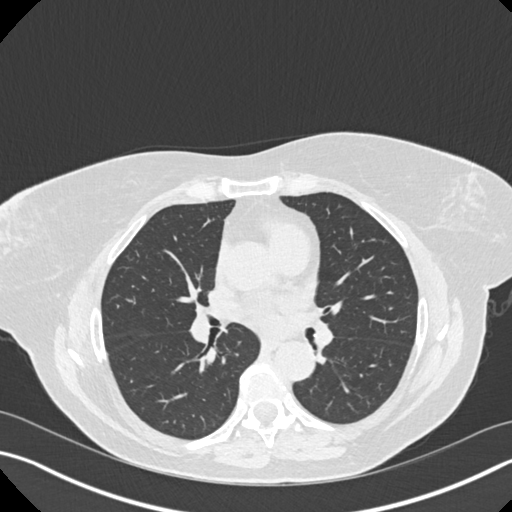
[im 201/335  lung]
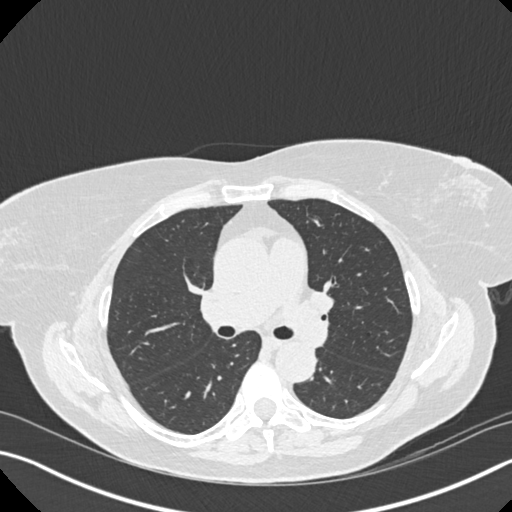
[im 223/335  mediastinal]
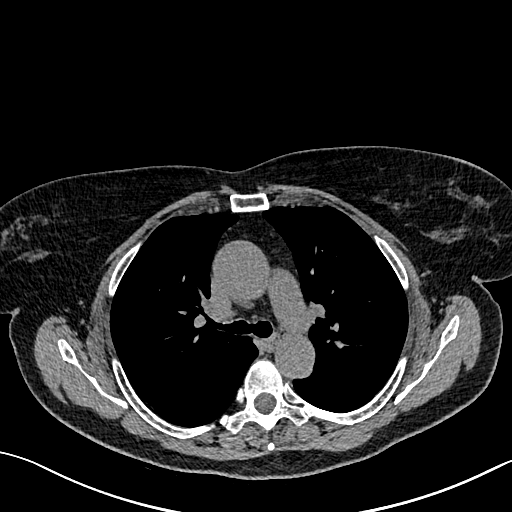
[im 223/335  lung]
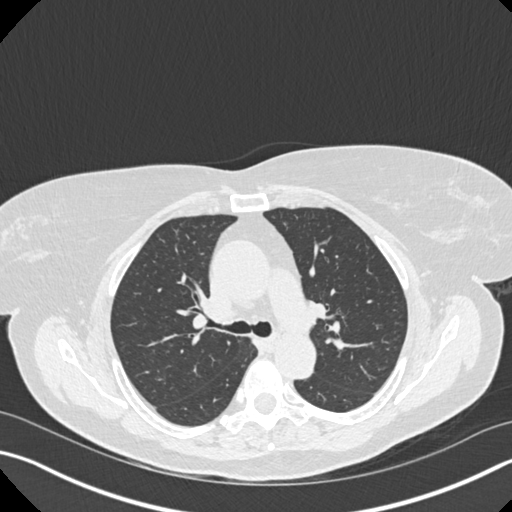
[im 268/335  lung]
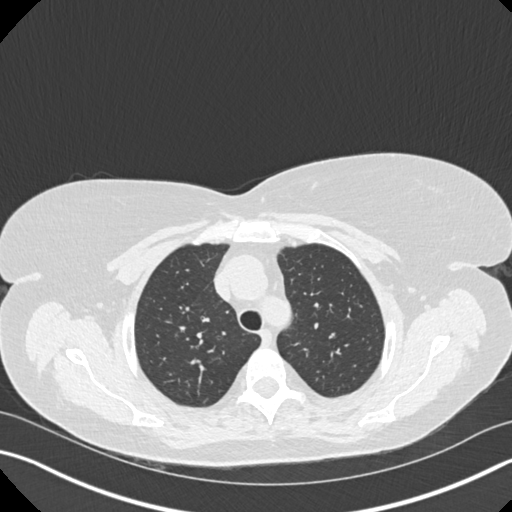
[im 290/335  lung]
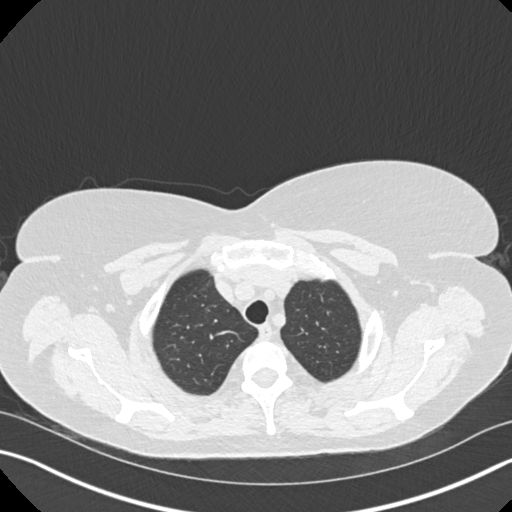
[im 312/335  lung]
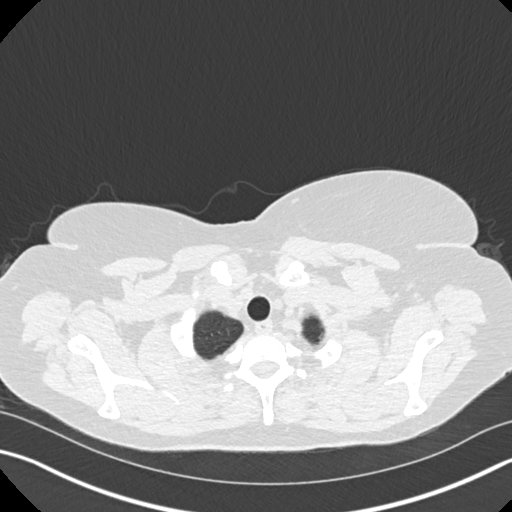

[15 of 36 positions shown; findings below may reference images not displayed]

FINDINGS: Cardiovascular: Limited evaluation without intravenous contrast.
Mild aortic atherosclerosis. No aneurysm. Normal cardiac size. No
pericardial effusion

Mediastinum/Nodes: Midline trachea. No thyroid mass. No suspicious
lymph nodes. Esophagus within normal limits

Lungs/Pleura: No acute airspace disease or pleural effusion. Peri
fissural right lower lobe pulmonary nodule measures 3 mm, series 8,
image 61, previously 4 mm. Stable 4 mm left lower lobe lung nodule,
series 8, image 89. Stable 2 mm subpleural left lower lobe pulmonary
nodule, series 8, image 93. No new pulmonary nodules.

Upper Abdomen: No acute abnormality.

Musculoskeletal: No chest wall mass or suspicious bone lesions
identified.
IMPRESSION: 1. Stable bilateral pulmonary nodules, considered benign. No
follow-up imaging is recommended.
2. No acute abnormality is visualized.

Aortic Atherosclerosis ([id number]).

## 2024-07-11 ENCOUNTER — Ambulatory Visit
